# Patient Record
Sex: Female | Born: 1935 | Race: White | Hispanic: No | Smoking: Never smoker
Health system: Southern US, Community
[De-identification: ages and names within clinical notes are randomized; demographics above are authoritative.]

## PROBLEM LIST (undated history)

## (undated) DIAGNOSIS — I639 Cerebral infarction, unspecified: Secondary | ICD-10-CM

## (undated) DIAGNOSIS — K219 Gastro-esophageal reflux disease without esophagitis: Secondary | ICD-10-CM

## (undated) DIAGNOSIS — U071 COVID-19: Secondary | ICD-10-CM

## (undated) DIAGNOSIS — I10 Essential (primary) hypertension: Secondary | ICD-10-CM

## (undated) DIAGNOSIS — C801 Malignant (primary) neoplasm, unspecified: Secondary | ICD-10-CM

## (undated) DIAGNOSIS — E785 Hyperlipidemia, unspecified: Secondary | ICD-10-CM

## (undated) DIAGNOSIS — I4891 Unspecified atrial fibrillation: Secondary | ICD-10-CM

## (undated) HISTORY — DX: Malignant (primary) neoplasm, unspecified: C80.1

## (undated) HISTORY — DX: Essential (primary) hypertension: I10

## (undated) HISTORY — DX: Hyperlipidemia, unspecified: E78.5

## (undated) HISTORY — DX: Unspecified atrial fibrillation: I48.91

## (undated) HISTORY — DX: Cerebral infarction, unspecified: I63.9

## (undated) HISTORY — DX: Gastro-esophageal reflux disease without esophagitis: K21.9

---
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
## 1981-11-29 HISTORY — PX: TOTAL ABDOMINAL HYSTERECTOMY: SHX209

## 2007-11-30 HISTORY — PX: COLONOSCOPY: SHX174

## 2013-11-29 HISTORY — PX: CATARACT EXTRACTION: SUR2

## 2016-02-09 ENCOUNTER — Telehealth: Payer: Self-pay | Admitting: Cardiology

## 2016-02-09 NOTE — Telephone Encounter (Signed)
Received records from [facility] for appointment on 02/13/16 with Dr Percival Spanish.  Records given to [facility] (medical records) for Dr Hochrein's schedule on 02/13/16.

## 2016-02-12 NOTE — Progress Notes (Signed)
I have personally oh     Cardiology Office Note   Date:  02/13/2016   ID:  Sarah Armstrong, DOB 1931-05-21, MRN [id number]  PCP:  No primary care provider on file.  Cardiologist:   Minus Breeding, MD   Chief Complaint  Patient presents with  . Atrial Fibrillation      History of Present Illness: Sarah Armstrong is a 82 y.o. female who presents for evaluation of atrial fibrillation. She was found to have this in January. She had been caring for her husband who had chronic health issues and who subsequently died in 01/24/2023. At the noticing palpitations for a few weeks before that. She eventually was noted to have atrial fibrillation. I did review records from his cardiologist in [place] saw her. Echocardiography demonstrated normal ejection fraction with some mild mitral and tricuspid regurgitation. She also had some mild carotid stenosis on Doppler. Cardizem was added to her medications and she was started on Xarelto. Of note that dose has been reduced from 20-15 mg apparently in response to her creatinine although I don't have these labs.  The patient had not had prior cardiac workup or problems. She does feel the palpitations. She feels fatigued. She has more dyspnea on exertion and she was having prior to developing the fibrillation. Her heart rates in the 90s. She did bring a blood pressure diary and her blood pressures been well controlled. She denies any chest pressure, neck discomfort. She is rather sedentary and seems. She's not having any PND or orthopnea. She's had no weight gain or edema.   Past Medical History  Diagnosis Date  . Hypertension   . Hyperlipidemia   . Stroke ([place])   . GERD (gastroesophageal reflux disease)   . Cancer ([place]) skin  . Atrial fibrillation [facility])     Past Surgical History  Procedure Laterality Date  . Tonsillectomy  1953  . Total abdominal hysterectomy  1983  . Colonoscopy  2009  . Cataract extraction  2015     Current Outpatient Prescriptions    Medication Sig Dispense Refill  . acetaminophen (TYLENOL) 500 MG tablet Take 500 mg by mouth every 6 (six) hours as needed.    Marland Kitchen atenolol (TENORMIN) 100 MG tablet Take 1 tablet by mouth daily.    Marland Kitchen atorvastatin (LIPITOR) 20 MG tablet Take 1 tablet by mouth daily.    . Cholecalciferol (VITAMIN D3 SUPER STRENGTH) 2000 units TABS Take 1 tablet by mouth daily.    . diazepam (VALIUM) 5 MG tablet Take 1 tablet by mouth as needed.    . diltiazem (CARDIZEM) 120 MG tablet Take 120 mg by mouth daily.    . lansoprazole (PREVACID) 30 MG capsule Take 1 capsule by mouth 2 (two) times daily.    Marland Kitchen loratadine (CLARITIN) 10 MG tablet Take 1 tablet by mouth daily.    . Multiple Vitamins-Minerals (CENTRUM PO) Take 5 tablets by mouth once a week.    . sodium chloride (V-R NASAL SPRAY SALINE) 0.65 % nasal spray Place 1 spray into both nostrils at bedtime.    Alveda Reasons 15 MG TABS tablet Take 1 tablet by mouth daily.     No current facility-administered medications for this visit.    Allergies:   Strawberry extract; Aspirin; Codeine; Other; Penicillin g; and Shellfish-derived products    Social History:  The patient  reports that she has never smoked. She does not have any smokeless tobacco history on file.   Family History:  The patient's family history includes COPD  in her sister; Cancer in her maternal grandfather; Diabetes in her child; Heart disease in her father; Heart disease (age of onset: 54) in her brother and brother; Hypertension in her child and mother; Leukemia in her brother; Parkinson's disease in her sister; Stroke in her maternal grandmother.    ROS:  Please see the history of present illness.   Otherwise, review of systems are positive for none.   All other systems are reviewed and negative.    PHYSICAL EXAM: VS:  BP 138/64 mmHg  Pulse 91 , BMI There is no height or weight on file to calculate BMI. GENERAL:  Well appearing HEENT:  Pupils equal round and reactive, fundi not visualized,  oral mucosa unremarkable NECK:  No jugular venous distention, waveform within normal limits, carotid upstroke brisk and symmetric, no bruits, no thyromegaly LYMPHATICS:  No cervical, inguinal adenopathy LUNGS:  Clear to auscultation bilaterally BACK:  No CVA tenderness CHEST:  Unremarkable HEART:  PMI not displaced or sustained,S1 and S2 within normal limits, no S3, no clicks, no rubs, no murmurs, irregular ABD:  Flat, positive bowel sounds normal in frequency in pitch, no bruits, no rebound, no guarding, no midline pulsatile mass, no hepatomegaly, no splenomegaly EXT:  2 plus pulses throughout, no edema, no cyanosis no clubbing SKIN:  No rashes no nodules NEURO:  Cranial nerves II through XII grossly intact, motor grossly intact throughout PSYCH:  Cognitively intact, oriented to person place and time    EKG:  EKG is not ordered today. The ekg ordered 1/11/17demonstrates atrial fibrillation, rate 92, axis within normal limits, intervals within normal limits, borderline low voltage in limb leads, no acute ST-T wave changes.   Recent Labs: No results found for requested labs within last 365 days.    Lipid Panel No results found for: CHOL, TRIG, HDL, CHOLHDL, VLDL, LDLCALC, LDLDIRECT    Wt Readings from Last 3 Encounters:  No data found for Wt      Other studies Reviewed: Additional studies/ records that were reviewed today include: EKGs, echo report and outside office records. Review of the above records demonstrates:  Please see elsewhere in the note.     ASSESSMENT AND PLAN:  ATRIAL FIB:  I had a long discussion with the patient and her daughters about this dysrhythmia. I do think she symptomatic with this. The rate seems to be reasonably well controlled. I'm going to make sure that she is on the appropriate dose Xarelto. Once she has been on the appropriate dose for 3 weeks we can plan cardioversion. For now she will remain on the meds as listed. I will be checking a BNP  level, basic metabolic profile and TSH.  MR/TR:  These are mild ankle.clinically and repeat echocardiography in the future.   Current medicines are reviewed at length with the patient today.  The patient does not have concerns regarding medicines.  The following changes have been made:  no change  Labs/ tests ordered today include:   Orders Placed This Encounter  Procedures  . Basic Metabolic Panel (BMET)  . B Nat Peptide  . TSH     Disposition:   FU with me at the time of DCCV.     Signed, Minus Breeding, MD  02/13/2016 5:44 PM    [facility]

## 2016-02-13 ENCOUNTER — Ambulatory Visit (INDEPENDENT_AMBULATORY_CARE_PROVIDER_SITE_OTHER): Payer: Medicare Other | Admitting: Cardiology

## 2016-02-13 ENCOUNTER — Encounter: Payer: Self-pay | Admitting: Cardiology

## 2016-02-13 VITALS — BP 138/64 | HR 91 | Ht 67.0 in | Wt 181.0 lb

## 2016-02-13 DIAGNOSIS — I4819 Other persistent atrial fibrillation: Secondary | ICD-10-CM

## 2016-02-13 DIAGNOSIS — I4891 Unspecified atrial fibrillation: Secondary | ICD-10-CM | POA: Insufficient documentation

## 2016-02-13 DIAGNOSIS — Z79899 Other long term (current) drug therapy: Secondary | ICD-10-CM

## 2016-02-13 DIAGNOSIS — I481 Persistent atrial fibrillation: Secondary | ICD-10-CM | POA: Diagnosis not present

## 2016-02-13 DIAGNOSIS — R0602 Shortness of breath: Secondary | ICD-10-CM | POA: Diagnosis not present

## 2016-02-13 NOTE — Patient Instructions (Signed)
Your physician recommends that you return for lab work in: BNP, TSH, BMP

## 2016-02-14 LAB — BASIC METABOLIC PANEL
BUN: 15 mg/dL (ref 7–25)
CALCIUM: 9.5 mg/dL (ref 8.6–10.4)
CHLORIDE: 102 mmol/L (ref 98–110)
CO2: 27 mmol/L (ref 20–31)
CREATININE: 0.96 mg/dL — AB (ref 0.60–0.88)
GLUCOSE: 141 mg/dL — AB (ref 65–99)
Potassium: 4.4 mmol/L (ref 3.5–5.3)
Sodium: 141 mmol/L (ref 135–146)

## 2016-02-14 LAB — BRAIN NATRIURETIC PEPTIDE: Brain Natriuretic Peptide: 120.5 pg/mL — ABNORMAL HIGH (ref <100)

## 2016-02-14 LAB — TSH: TSH: 1.66 mIU/L

## 2016-02-16 ENCOUNTER — Telehealth: Payer: Self-pay | Admitting: *Deleted

## 2016-02-16 MED ORDER — RIVAROXABAN 20 MG PO TABS: 20.0000 mg | ORAL_TABLET | Freq: Every day | ORAL | Status: DC

## 2016-02-16 NOTE — Telephone Encounter (Signed)
Spoke with pt Daughter told her her mom will need to increase Xarelto for Cardioversion in 3 weeks, pt stated her mom was in the Emergency Room at [facility] on Sunday for nose bleed second nose bleed since being on Xarelto, pt will be going to ENT on Wednesday to see Dr Melida Quitter, pt think her mom might be skeptical about increasing Xarelto but will give it to her because you told her to.  Xarelto 20mg  # 30 11 refills was send to [facility] on [address].

## 2016-02-16 NOTE — Telephone Encounter (Signed)
-----   Message from Minus Breeding, MD sent at 02/16/2016  9:43 AM EDT ----- Her creat clearance calculates to be 56.  She should be on Xarelto 20 mg prior to cardioversion.  She will need to be on this for 3 weeks before I can schedule cardioversion.  Call her and call in Xarelto 20 mg one po daily disp number 30 with 11 refills.  Schedule a DCCV in 3 weeks by me.

## 2016-02-17 ENCOUNTER — Telehealth: Payer: Self-pay | Admitting: Cardiology

## 2016-02-17 NOTE — Telephone Encounter (Signed)
Returned call. Pt has PCP in [place], but doesn't have a lot of confidence in the provider. When pt's daughter had called, was communicated that she would address the patient's concerns in May at next sched f/u.  Epistaxis addressed at last visit. Pt to see ENT tomorrow.  Daughter sees provider at [facility]. She can take the patient there for change in PCP. She wanted to verify Dr. Percival Spanish agreed w/ my recommendation to defer to PCP for antidepressant. Will route.

## 2016-02-17 NOTE — Telephone Encounter (Signed)
Pt daughter would like to know if Dr Warren Lacy can precribe an anti depression medicine for her mother.. The pt is going through a lot,her husband passed away about a month ago,having nose bleeds and some confusion.

## 2016-02-18 NOTE — Telephone Encounter (Signed)
Left msg w/ advice & recommendation to call if questions.

## 2016-02-18 NOTE — Telephone Encounter (Signed)
She needs to get the nose bleed situation sorted out before we can plan cardioversion.  If they tell her that she needs to hold the Xarelto we will have to defer the cardioversion.  If she has ongoing bleeding then she will need to hold Xarelto.  If the bleeding is controlled and not ongoing and ENT says it is OK her therapeutic dose is not 15 mg it is 20.  She would be on 3 weeks before elective cardioversion.   We need to understand when she is seeing ENT and what they recommend.

## 2016-02-18 NOTE — Telephone Encounter (Signed)
Yes.  I don't feel confident prescribing the antidepressants.

## 2016-02-24 NOTE — Telephone Encounter (Signed)
Spoke with pt daughter, she have not been off of Xarelto, Cardioversion will be order for the second week in april

## 2016-02-25 ENCOUNTER — Telehealth: Payer: Self-pay | Admitting: Cardiology

## 2016-02-25 ENCOUNTER — Emergency Department (HOSPITAL_BASED_OUTPATIENT_CLINIC_OR_DEPARTMENT_OTHER)
Admission: EM | Admit: 2016-02-25 | Discharge: 2016-02-25 | Disposition: A | Discharge status: Home / Self Care | Payer: Medicare Other | Attending: Emergency Medicine | Admitting: Emergency Medicine

## 2016-02-25 ENCOUNTER — Emergency Department (HOSPITAL_BASED_OUTPATIENT_CLINIC_OR_DEPARTMENT_OTHER): Payer: Medicare Other

## 2016-02-25 ENCOUNTER — Encounter (HOSPITAL_BASED_OUTPATIENT_CLINIC_OR_DEPARTMENT_OTHER): Payer: Self-pay

## 2016-02-25 DIAGNOSIS — I4891 Unspecified atrial fibrillation: Secondary | ICD-10-CM | POA: Diagnosis present

## 2016-02-25 DIAGNOSIS — Z79899 Other long term (current) drug therapy: Secondary | ICD-10-CM | POA: Diagnosis not present

## 2016-02-25 DIAGNOSIS — I482 Chronic atrial fibrillation, unspecified: Secondary | ICD-10-CM

## 2016-02-25 DIAGNOSIS — Z7901 Long term (current) use of anticoagulants: Secondary | ICD-10-CM | POA: Insufficient documentation

## 2016-02-25 DIAGNOSIS — F419 Anxiety disorder, unspecified: Secondary | ICD-10-CM | POA: Insufficient documentation

## 2016-02-25 DIAGNOSIS — R05 Cough: Secondary | ICD-10-CM | POA: Insufficient documentation

## 2016-02-25 DIAGNOSIS — K219 Gastro-esophageal reflux disease without esophagitis: Secondary | ICD-10-CM | POA: Diagnosis not present

## 2016-02-25 DIAGNOSIS — I10 Essential (primary) hypertension: Secondary | ICD-10-CM | POA: Insufficient documentation

## 2016-02-25 DIAGNOSIS — Z880 Allergy status to penicillin: Secondary | ICD-10-CM | POA: Diagnosis not present

## 2016-02-25 DIAGNOSIS — E785 Hyperlipidemia, unspecified: Secondary | ICD-10-CM | POA: Diagnosis not present

## 2016-02-25 DIAGNOSIS — Z8673 Personal history of transient ischemic attack (TIA), and cerebral infarction without residual deficits: Secondary | ICD-10-CM | POA: Diagnosis not present

## 2016-02-25 DIAGNOSIS — Z859 Personal history of malignant neoplasm, unspecified: Secondary | ICD-10-CM | POA: Insufficient documentation

## 2016-02-25 DIAGNOSIS — R197 Diarrhea, unspecified: Secondary | ICD-10-CM | POA: Diagnosis not present

## 2016-02-25 DIAGNOSIS — R002 Palpitations: Secondary | ICD-10-CM

## 2016-02-25 LAB — CBC WITH DIFFERENTIAL/PLATELET
BASOS ABS: 0 10*3/uL (ref 0.0–0.1)
BASOS PCT: 0 %
EOS ABS: 0.2 10*3/uL (ref 0.0–0.7)
Eosinophils Relative: 2 %
HCT: 41.5 % (ref 36.0–46.0)
HEMOGLOBIN: 13.4 g/dL (ref 12.0–15.0)
Lymphocytes Relative: 27 %
Lymphs Abs: 2.3 10*3/uL (ref 0.7–4.0)
MCH: 32.5 pg (ref 26.0–34.0)
MCHC: 32.3 g/dL (ref 30.0–36.0)
MCV: 100.7 fL — ABNORMAL HIGH (ref 78.0–100.0)
MONOS PCT: 15 %
Monocytes Absolute: 1.3 10*3/uL — ABNORMAL HIGH (ref 0.1–1.0)
NEUTROS PCT: 56 %
Neutro Abs: 4.8 10*3/uL (ref 1.7–7.7)
Platelets: 190 10*3/uL (ref 150–400)
RBC: 4.12 MIL/uL (ref 3.87–5.11)
RDW: 13 % (ref 11.5–15.5)
WBC: 8.5 10*3/uL (ref 4.0–10.5)

## 2016-02-25 LAB — BASIC METABOLIC PANEL
Anion gap: 10 (ref 5–15)
BUN: 9 mg/dL (ref 6–20)
CALCIUM: 9.2 mg/dL (ref 8.9–10.3)
CO2: 27 mmol/L (ref 22–32)
CREATININE: 0.86 mg/dL (ref 0.44–1.00)
Chloride: 102 mmol/L (ref 101–111)
Glucose, Bld: 110 mg/dL — ABNORMAL HIGH (ref 65–99)
Potassium: 3.6 mmol/L (ref 3.5–5.1)
SODIUM: 139 mmol/L (ref 135–145)

## 2016-02-25 LAB — PROTIME-INR
INR: 1.66 — ABNORMAL HIGH (ref 0.00–1.49)
PROTHROMBIN TIME: 19.6 s — AB (ref 11.6–15.2)

## 2016-02-25 LAB — TROPONIN I

## 2016-02-25 MED ORDER — DILTIAZEM HCL 60 MG PO TABS: 60.0000 mg | ORAL_TABLET | Freq: Every day | ORAL | Status: DC

## 2016-02-25 NOTE — ED Notes (Signed)
Patient transported to X-ray 

## 2016-02-25 NOTE — ED Notes (Signed)
MD at bedside. 

## 2016-02-25 NOTE — Telephone Encounter (Signed)
[name] is calling because Sarah Armstrong is schedule for a cardioversion  next week . Her heart is very iratic and causing other problems , she is having upset stomach , pain in her shoulder , constant heartburn . Please call   Thanks

## 2016-02-25 NOTE — Discharge Instructions (Signed)
AS WE DISCUSSED - YOU CAN TAKE 180MG  OF CARDIZEM DAILY (IN THE MORNING) YOU CAN TAKE ATENOLOL 50MG  IN THE MORNING AND 50MG  AT NIGHT TAKE CARE  Atrial Fibrillation Atrial fibrillation is a type of heartbeat that is irregular or fast (rapid). If you have this condition, your heart keeps quivering in a weird (chaotic) way. This condition can make it so your heart cannot pump blood normally. Having this condition gives a person more risk for stroke, heart failure, and other heart problems. There are different types of atrial fibrillation. Talk with your doctor to learn about the type that you have. HOME CARE  Take over-the-counter and prescription medicines only as told by your doctor.  If your doctor prescribed a blood-thinning medicine, take it exactly as told. Taking too much of it can cause bleeding. If you do not take enough of it, you will not have the protection that you need against stroke and other problems.  Do not use any tobacco products. These include cigarettes, chewing tobacco, and e-cigarettes. If you need help quitting, ask your doctor.  If you have apnea (obstructive sleep apnea), manage it as told by your doctor.  Do not drink alcohol.  Do not drink beverages that have caffeine. These include coffee, soda, and tea.  Maintain a healthy weight. Do not use diet pills unless your doctor says they are safe for you. Diet pills may make heart problems worse.  Follow diet instructions as told by your doctor.  Exercise regularly as told by your doctor.  Keep all follow-up visits as told by your doctor. This is important. GET HELP IF:  You notice a change in the speed, rhythm, or strength of your heartbeat.  You are taking a blood-thinning medicine and you notice more bruising.  You get tired more easily when you move or exercise. GET HELP RIGHT AWAY IF:  You have pain in your chest or your belly (abdomen).  You have sweating or weakness.  You feel sick to your stomach  (nauseous).  You notice blood in your throw up (vomit), poop (stool), or pee (urine).  You are short of breath.  You suddenly have swollen feet and ankles.  You feel dizzy.  Your suddenly get weak or numb in your face, arms, or legs, especially if it happens on one side of your body.  You have trouble talking, trouble understanding, or both.  Your face or your eyelid droops on one side. These symptoms may be an emergency. Do not wait to see if the symptoms will go away. Get medical help right away. Call your local emergency services (911 in the [place] Do not drive yourself to the hospital.   This information is not intended to replace advice given to you by your health care provider. Make sure you discuss any questions you have with your health care provider.   Document Released: 08/24/2008 Document Revised: 08/06/2015 Document Reviewed: 03/12/2015 Elsevier Interactive Patient Education [employer].

## 2016-02-25 NOTE — ED Notes (Signed)
Pt to exam room 4, placed on cont cardiac monitor with freq NBP and cont POX, ECG being done

## 2016-02-25 NOTE — ED Notes (Addendum)
Hx of Afib-dx in Jan 2017-started on meds-to be scheduled for cardioversion next week-pt c/o increase in CP, SOB-brought to triage by daughters x 2 in w/c-denies pain at present-NAD

## 2016-02-25 NOTE — ED Provider Notes (Signed)
CSN: [id number]     Arrival date & time 02/25/16  1233 History   First MD Initiated Contact with Patient 02/25/16 1250     Chief Complaint  Patient presents with  . Atrial Fibrillation    Patient is a 82 y.o. female presenting with atrial fibrillation. The history is provided by the patient.  Atrial Fibrillation This is a chronic problem. The current episode started more than 1 week ago. The problem occurs daily. The problem has been gradually worsening. Associated symptoms include shortness of breath. Pertinent negatives include no abdominal pain. Nothing aggravates the symptoms.  Patient with h/o HTN, hyperlipidemia and chronic a-fib presents with increased palpitations and shortness of breath.  She reports she takes meds everyday for afib, but only in the morning with breakfast, and usually by middle of the night around 3am she will have episodes of heart racing/palpitations.  She will also have mild CP at that time She is now feeling improved No CP at this time No syncope No fever/vomiting She reports fatigue for "Awhile"     Past Medical History  Diagnosis Date  . Hypertension   . Hyperlipidemia   . Stroke ([place])   . GERD (gastroesophageal reflux disease)   . Cancer ([place]) skin  . Atrial fibrillation [facility])    Past Surgical History  Procedure Laterality Date  . Tonsillectomy  1953  . Total abdominal hysterectomy  1983  . Colonoscopy  2009  . Cataract extraction  2015   Family History  Problem Relation Age of Onset  . Hypertension Mother   . Heart disease Father     CHF  . Heart disease Brother 67  . Stroke Maternal Grandmother   . Cancer Maternal Grandfather   . Leukemia Brother   . Heart disease Brother 71  . COPD Sister   . Parkinson's disease Sister   . Hypertension Child   . Diabetes Child    Social History  Substance Use Topics  . Smoking status: Never Smoker   . Smokeless tobacco: None  . Alcohol Use: None   OB History    No data available      Review of Systems  Constitutional: Positive for fatigue. Negative for fever.  Respiratory: Positive for cough and shortness of breath.   Cardiovascular: Positive for palpitations.  Gastrointestinal: Positive for diarrhea. Negative for vomiting, abdominal pain and blood in stool.  Neurological: Negative for syncope.  Psychiatric/Behavioral: The patient is nervous/anxious.   All other systems reviewed and are negative.     Allergies  Strawberry extract; Aspirin; Codeine; Other; Penicillin g; and Shellfish-derived products  Home Medications   Prior to Admission medications   Medication Sig Start Date End Date Taking? Authorizing Provider  PARoxetine (PAXIL) 10 MG tablet Take 10 mg by mouth daily.   Yes Historical Provider, MD  acetaminophen (TYLENOL) 500 MG tablet Take 500 mg by mouth every 6 (six) hours as needed.    Historical Provider, MD  atenolol (TENORMIN) 100 MG tablet Take 1 tablet by mouth daily. 01/19/16   Historical Provider, MD  atorvastatin (LIPITOR) 20 MG tablet Take 1 tablet by mouth daily. 06/26/15   Historical Provider, MD  Cholecalciferol (VITAMIN D3 SUPER STRENGTH) 2000 units TABS Take 1 tablet by mouth daily.    Historical Provider, MD  diazepam (VALIUM) 5 MG tablet Take 1 tablet by mouth as needed. 12/03/15   Historical Provider, MD  diltiazem (CARDIZEM) 120 MG tablet Take 120 mg by mouth daily.    Historical Provider, MD  diltiazem (  CARDIZEM) 60 MG tablet Take 1 tablet (60 mg total) by mouth daily. 02/25/16   Ripley Fraise, MD  lansoprazole (PREVACID) 30 MG capsule Take 1 capsule by mouth 2 (two) times daily. 02/09/16   Historical Provider, MD  loratadine (CLARITIN) 10 MG tablet Take 1 tablet by mouth daily.    Historical Provider, MD  Multiple Vitamins-Minerals (CENTRUM PO) Take 5 tablets by mouth once a week.    Historical Provider, MD  rivaroxaban (XARELTO) 20 MG TABS tablet Take 1 tablet (20 mg total) by mouth daily with supper. 02/16/16   Minus Breeding, MD   sodium chloride (V-R NASAL SPRAY SALINE) 0.65 % nasal spray Place 1 spray into both nostrils at bedtime.    Historical Provider, MD   BP 135/74 mmHg  Pulse 94  Temp(Src) 98.7 F (37.1 C) (Oral)  Resp 17  Ht 5\' 7"  (1.702 m)  Wt 82.101 kg  BMI 28.34 kg/m2  SpO2 96% Physical Exam CONSTITUTIONAL:Elderly, no distress HEAD: Normocephalic/atraumatic EYES: EOMI/PERRL ENMT: Mucous membranes moist, uvula midline NECK: supple no meningeal signs SPINE/BACK:entire spine nontender CV: S1/S2 noted, irregular LUNGS: brief crackles in left base, no distress noted ABDOMEN: soft, nontender, no rebound or guarding, bowel sounds noted throughout abdomen GU:no cva tenderness NEURO: Pt is awake/alert/appropriate, moves all extremitiesx4.   EXTREMITIES: pulses normal/equal, full ROM SKIN: warm, color normal PSYCH: mildly anxious  ED Course  Procedures  Labs Review Labs Reviewed  BASIC METABOLIC PANEL - Abnormal; Notable for the following:    Glucose, Bld 110 (*)    All other components within normal limits  CBC WITH DIFFERENTIAL/PLATELET - Abnormal; Notable for the following:    MCV 100.7 (*)    Monocytes Absolute 1.3 (*)    All other components within normal limits  PROTIME-INR - Abnormal; Notable for the following:    Prothrombin Time 19.6 (*)    INR 1.66 (*)    All other components within normal limits  TROPONIN I    Imaging Review Dg Chest 2 View  02/25/2016  CLINICAL DATA:  Weakness, fatigue, shortness of breath, cough, chest pain, palpitations EXAM: CHEST  2 VIEW COMPARISON:  None. FINDINGS: Cardiomegaly. There is hyperinflation of the lungs compatible with COPD. No confluent airspace opacities or effusions. Mild vascular congestion. No acute bony abnormality. IMPRESSION: COPD.  Cardiomegaly with vascular congestion. Electronically Signed   By: Rolm Baptise M.D.   On: 02/25/2016 13:50   I have personally reviewed and evaluated these images and lab results as part of my medical  decision-making.   EKG Interpretation   Date/Time:  Wednesday February 25 2016 12:51:48 EDT Ventricular Rate:  83 PR Interval:    QRS Duration: 85 QT Interval:  420 QTC Calculation: 493 R Axis:   43 Text Interpretation:  Atrial fibrillation Borderline T abnormalities,  anterior leads Borderline prolonged QT interval Baseline wander in lead(s)  V2 No previous ECGs available Abnormal ekg Confirmed by Christy Gentles  MD,  Elenore Rota ([id number]) on 02/25/2016 12:59:57 PM     Pt presents with worsening signs of her chronic afib Pt stable in the ED Per cardiology notes, she was to be on xarelto 20mg  daily for 3 weeks prior to cardioversion It has not been 3 weeks as of yet I spoke to dr hochrein, he recommends to help prevent tachycardia, increase her cardizem to 180mg  daily, and take atenolol 50mg  BID.   The only other option is TEE-cardioversion, which the patient is not requesting at this time She is otherwise well appearing/stable Patient/family agreeable with  plan   MDM   Final diagnoses:  Chronic atrial fibrillation ([place])  Palpitations    Nursing notes including past medical history and social history reviewed and considered in documentation xrays/imaging reviewed by myself and considered during evaluation Labs/vital reviewed myself and considered during evaluation Previous records reviewed and considered - recent office visits reviewed from cardiology     Ripley Fraise, MD 02/25/16 1610

## 2016-02-25 NOTE — ED Notes (Signed)
Pt presents with irregular HR, states she has been dx with A-fib in the past month and started on cardizem per Dr. Percival Spanish, MD/ Cardiologist. States would have some left ant chest discomfort, but main complaint is weakness and fatigue.

## 2016-02-25 NOTE — Telephone Encounter (Signed)
Pt daughter called in stating pt is having continuous palpitations,SOB, and weakness. Pt has stopped taking her Prevacid as it was causing GI upset. Pt was started on Nexium but has not started taking it yet. Pt has also had diarrhea for the past few days.  Last night during sleeping pt was "quivering and SOB" according to daughter who was in room with pt. Pt never stopped her Xarelto during her nose bleeds last week, ENT was able to cauterize bleeds and gave her Bactroban.  Pt GI upset has gotten worse and she is also having shoulder pain.  Pt daughter said they were in the car headed to [place]. Told I would make Dr. Percival Spanish aware, verbalized understanding, no additional questions at this time.

## 2016-02-25 NOTE — ED Notes (Addendum)
States has some SOB when up walking around and also has palpations

## 2016-02-26 NOTE — Telephone Encounter (Signed)
I talked with the ED MD about this patient and we have a plan.  Please see ED notes.

## 2016-02-27 ENCOUNTER — Telehealth: Payer: Self-pay | Admitting: Cardiology

## 2016-02-27 NOTE — Telephone Encounter (Signed)
New message     Pt c/o BP issue: STAT if pt c/o blurred vision, one-sided weakness or slurred speech  1. What are your last 5 BP readings? 160/96 and HR 100---high; 111/54 and HR 56 low 2. Are you having any other symptoms (ex. Dizziness, headache, blurred vision, passed out)? Some sob 3. What is your BP issue?  In the mornings, pt bp and pulse is high.  She is still having palpitations in am.  By lunch time, her bp and HR has dropped low.  Should daughter change the extra 60mg  of cardizem to the night time?

## 2016-02-27 NOTE — Telephone Encounter (Signed)
The Cardizem is once daily.  Her heart rate is OK for now.  The cardioversion cannot be done until she has been on the 20 mg dose for 3 weeks.  That dose was prescribed when I saw her 2 weeks ago.  She could have the DCCV scheduled electively next Friday.

## 2016-02-27 NOTE — Telephone Encounter (Signed)
Daughter calling back. Notes recent ED visit for comlants. Meds were changed. Pt's cardizem was increased. Taking 50mg  atenolol BID.  Forcing fluids, but pt has upset stomach. Notes BP high in AM 165/90 HR 89-102  Gave meds Repeat a few mins ago 111/54 HR 56   Daughter has 2 concerns: should the cardizem be taken only once a day? (I'm not sure if this is a long acting form) Also, pt has been on xarelto for 3 weeks- is she suitable for cardioversion, and if so, how soon would that be scheduled?  Daughter is trying to figure out whether to take patient back up to [place] - she will keep her in [place] if only going to be a few days.

## 2016-02-27 NOTE — Telephone Encounter (Signed)
Recommendations given to caller who voiced understanding, message sent to scheduling.

## 2016-03-02 ENCOUNTER — Telehealth: Payer: Self-pay | Admitting: *Deleted

## 2016-03-02 DIAGNOSIS — I4819 Other persistent atrial fibrillation: Secondary | ICD-10-CM

## 2016-03-02 NOTE — Telephone Encounter (Signed)
-----   Message from Minus Breeding, MD sent at 02/16/2016  9:43 AM EDT ----- Her creat clearance calculates to be 56.  She should be on Xarelto 20 mg prior to cardioversion.  She will need to be on this for 3 weeks before I can schedule cardioversion.  Call her and call in Xarelto 20 mg one po daily disp number 30 with 11 refills.  Schedule a DCCV in 3 weeks by me.

## 2016-03-02 NOTE — Telephone Encounter (Signed)
Cardioversion ordered and send to scheduler

## 2016-03-03 ENCOUNTER — Telehealth: Payer: Self-pay | Admitting: Cardiology

## 2016-03-03 NOTE — Telephone Encounter (Signed)
Returned call to patient no answer.LMTC. 

## 2016-03-03 NOTE — Telephone Encounter (Signed)
New message   Pt daughter is calling to speak to rn

## 2016-03-03 NOTE — Telephone Encounter (Signed)
New message     Patient calling back to speak with nurse - verbalized wants a call back today

## 2016-03-03 NOTE — Telephone Encounter (Signed)
Received call from daughter calling for cardioversion instructions.Cardioversion scheduled 03/04/16 at 10:00 am.Advised to arrive at 8:00 am at [facility] ([place]).Cardioversion instructions reviewed with daughter she voiced understanding.Stated she will call back to schedule follow up visit,she has to check with her sister.

## 2016-03-04 ENCOUNTER — Encounter (HOSPITAL_COMMUNITY): Payer: Self-pay | Admitting: *Deleted

## 2016-03-04 ENCOUNTER — Telehealth: Payer: Self-pay | Admitting: Cardiology

## 2016-03-04 ENCOUNTER — Other Ambulatory Visit: Payer: Self-pay | Admitting: Cardiology

## 2016-03-04 ENCOUNTER — Encounter (HOSPITAL_COMMUNITY)
Admission: RE | Disposition: A | Discharge status: Home / Self Care | Payer: Self-pay | Source: Ambulatory Visit | Attending: Cardiology

## 2016-03-04 ENCOUNTER — Ambulatory Visit (HOSPITAL_COMMUNITY): Payer: Medicare Other | Admitting: Certified Registered Nurse Anesthetist

## 2016-03-04 ENCOUNTER — Ambulatory Visit (HOSPITAL_COMMUNITY)
Admission: RE | Admit: 2016-03-04 | Discharge: 2016-03-04 | Disposition: A | Discharge status: Home / Self Care | Payer: Medicare Other | Source: Ambulatory Visit | Attending: Cardiology | Admitting: Cardiology

## 2016-03-04 DIAGNOSIS — I4819 Other persistent atrial fibrillation: Secondary | ICD-10-CM

## 2016-03-04 DIAGNOSIS — I481 Persistent atrial fibrillation: Secondary | ICD-10-CM

## 2016-03-04 DIAGNOSIS — K219 Gastro-esophageal reflux disease without esophagitis: Secondary | ICD-10-CM | POA: Insufficient documentation

## 2016-03-04 DIAGNOSIS — I10 Essential (primary) hypertension: Secondary | ICD-10-CM | POA: Diagnosis not present

## 2016-03-04 DIAGNOSIS — I4891 Unspecified atrial fibrillation: Secondary | ICD-10-CM | POA: Diagnosis present

## 2016-03-04 DIAGNOSIS — Z7901 Long term (current) use of anticoagulants: Secondary | ICD-10-CM | POA: Insufficient documentation

## 2016-03-04 DIAGNOSIS — E785 Hyperlipidemia, unspecified: Secondary | ICD-10-CM | POA: Diagnosis not present

## 2016-03-04 DIAGNOSIS — Z8673 Personal history of transient ischemic attack (TIA), and cerebral infarction without residual deficits: Secondary | ICD-10-CM | POA: Diagnosis not present

## 2016-03-04 HISTORY — PX: CARDIOVERSION: SHX1299

## 2016-03-04 SURGERY — CARDIOVERSION
Anesthesia: Monitor Anesthesia Care

## 2016-03-04 MED ORDER — SODIUM CHLORIDE 0.9% FLUSH: 3.0000 mL | Freq: Two times a day (BID) | INTRAVENOUS | Status: DC

## 2016-03-04 MED ORDER — PROPOFOL 10 MG/ML IV BOLUS
INTRAVENOUS | Status: DC | PRN
  Administered 2016-03-04 (×2): 30 mg via INTRAVENOUS
  Administered 2016-03-04: 20 mg via INTRAVENOUS

## 2016-03-04 MED ORDER — SODIUM CHLORIDE 0.9% FLUSH: 3.0000 mL | INTRAVENOUS | Status: DC | PRN

## 2016-03-04 MED ORDER — SODIUM CHLORIDE 0.9 % IV SOLN
250.0000 mL | INTRAVENOUS | Status: DC
  Administered 2016-03-04: 500 mL via INTRAVENOUS

## 2016-03-04 MED ORDER — HYDROCORTISONE 1 % EX CREA: 1.0000 "application " | TOPICAL_CREAM | Freq: Three times a day (TID) | CUTANEOUS | Status: DC | PRN

## 2016-03-04 NOTE — Telephone Encounter (Signed)
Returned call to patient's daughter [name].She stated she wanted to ask Dr.Hochrein if mother needed to continue cardizem 60 mg tablet .Stated she is taking cardizem 120 mg and 60 mg daily and she is out of 60 mg.Spoke to Dr.Hochrein he advised ok to stop cardizem 60 mg.Continue cardizem 120 mg daily.

## 2016-03-04 NOTE — Interval H&P Note (Signed)
History and Physical Interval Note:  03/04/2016 9:57 AM  [name]  has presented today for surgery, with the diagnosis of AFIB  The various methods of treatment have been discussed with the patient and family. After consideration of risks, benefits and other options for treatment, the patient has consented to  Procedure(s): CARDIOVERSION (N/A) as a surgical intervention .  The patient's history has been reviewed, patient examined, no change in status, stable for surgery.  I have reviewed the patient's chart and labs.  Questions were answered to the patient's satisfaction.     [name],[name]

## 2016-03-04 NOTE — Transfer of Care (Signed)
Immediate Anesthesia Transfer of Care Note  Patient: Sarah Armstrong  Procedure(s) Performed: Procedure(s): CARDIOVERSION (N/A)  Patient Location: Endoscopy Unit  Anesthesia Type:MAC  Level of Consciousness: awake, alert  and oriented  Airway & Oxygen Therapy: Patient Spontanous Breathing and Patient connected to nasal cannula oxygen  Post-op Assessment: Report given to RN and Post -op Vital signs reviewed and stable  Post vital signs: Reviewed and stable  Last Vitals:  Filed Vitals:   03/04/16 0901  BP: 169/85  Temp: 37.1 C  Resp: 21    Complications: No apparent anesthesia complications

## 2016-03-04 NOTE — Anesthesia Preprocedure Evaluation (Signed)
Anesthesia Evaluation  Patient identified by MRN, date of birth, ID band Patient awake    Reviewed: Allergy & Precautions, NPO status , Patient's Chart, lab work & pertinent test results  History of Anesthesia Complications Negative for: history of anesthetic complications  Airway Mallampati: II  TM Distance: >3 FB Neck ROM: Full    Dental no notable dental hx.    Pulmonary    breath sounds clear to auscultation       Cardiovascular hypertension, Pt. on medications + dysrhythmias Atrial Fibrillation  Rhythm:Irregular     Neuro/Psych CVA    GI/Hepatic Neg liver ROS, GERD  Medicated and Controlled,  Endo/Other  negative endocrine ROS  Renal/GU negative Renal ROS     Musculoskeletal   Abdominal   Peds  Hematology negative hematology ROS (+)   Anesthesia Other Findings   Reproductive/Obstetrics                             Anesthesia Physical Anesthesia Plan  ASA: III  Anesthesia Plan: MAC   Post-op Pain Management:    Induction: Intravenous  Airway Management Planned: Natural Airway, Nasal Cannula and Simple Face Mask  Additional Equipment: None  Intra-op Plan:   Post-operative Plan:   Informed Consent: I have reviewed the patients History and Physical, chart, labs and discussed the procedure including the risks, benefits and alternatives for the proposed anesthesia with the patient or authorized representative who has indicated his/her understanding and acceptance.   Dental advisory given  Plan Discussed with: CRNA and Surgeon  Anesthesia Plan Comments:         Anesthesia Quick Evaluation

## 2016-03-04 NOTE — Telephone Encounter (Signed)
New message       *STAT* If patient is at the pharmacy, call can be transferred to refill team.   1. Which medications need to be refilled? (please list name of each medication and dose if known) cardizem 60mg  2. Which pharmacy/location (including street and city if local pharmacy) is medication to be sent to? [facility] [address] [address]. Do they need a 30 day or 90 day supply?  Need only 2 1/2 weeks of medications Pt had a cardioversion this am.  She is taking 180mg  of cardizem.  She has enough 120mg  but need a little more of the 60mg  to last until her 03-26-16 ov.

## 2016-03-04 NOTE — Discharge Instructions (Signed)

## 2016-03-04 NOTE — CV Procedure (Signed)
   Electrical Cardioversion Procedure Note Sarah Armstrong [id number] 06-08-1931  Procedure: Electrical Cardioversion Indications:  Atrial Fibrillation  Time Out: Verified patient identification, verified procedure,medications/allergies/relevent history reviewed, required imaging and test results available.  Performed  Procedure Details  The patient was NPO after midnight. Anesthesia was administered at the beside  by Dr.Moser with 80mg  of propofol.  Cardioversion was done with synchronized biphasic defibrillation with AP pads with 150watts.  The patient converted to normal sinus rhythm. The patient tolerated the procedure well   IMPRESSION:  Successful cardioversion of atrial fibrillation    Sarah Armstrong 03/04/2016, 9:57 AM

## 2016-03-04 NOTE — Telephone Encounter (Signed)
Spoke to patient's daughter [name].Dr.Hochrein wanting to make sure patient has been on Xarelto 20 mg daily for 3 weeks.Daughter stated mother increased Xarelto to 20 mg daily on 02/13/16.Dr.Hochrein was told.Post hospital follow up scheduled 03/26/16 at 9:30 am with Rosaria Ferries PA at [facility] office.

## 2016-03-04 NOTE — Anesthesia Postprocedure Evaluation (Signed)
Anesthesia Post Note  Patient: Sarah Armstrong  Procedure(s) Performed: Procedure(s) (LRB): CARDIOVERSION (N/A)  Patient location during evaluation: Endoscopy Anesthesia Type: MAC Level of consciousness: awake Pain management: pain level controlled Vital Signs Assessment: post-procedure vital signs reviewed and stable Respiratory status: spontaneous breathing Cardiovascular status: stable Postop Assessment: no signs of nausea or vomiting Anesthetic complications: no    Last Vitals:  Filed Vitals:   03/04/16 1050 03/04/16 1100  BP: 136/46   Pulse: 50 57  Temp:    Resp: 17 14    Last Pain: There were no vitals filed for this visit.               Monic Engelmann

## 2016-03-04 NOTE — H&P (View-Only) (Signed)
I have personally oh     Cardiology Office Note   Date:  02/13/2016   ID:  [name], DOB August 07, 1931, MRN [id number]  PCP:  No primary care provider on file.  Cardiologist:   Minus Breeding, MD   Chief Complaint  Patient presents with  . Atrial Fibrillation      History of Present Illness: Sarah Armstrong is a 82 y.o. female who presents for evaluation of atrial fibrillation. She was found to have this in January. She had been caring for her husband who had chronic health issues and who subsequently died in January 07, 2023. At the noticing palpitations for a few weeks before that. She eventually was noted to have atrial fibrillation. I did review records from his cardiologist in [place] saw her. Echocardiography demonstrated normal ejection fraction with some mild mitral and tricuspid regurgitation. She also had some mild carotid stenosis on Doppler. Cardizem was added to her medications and she was started on Xarelto. Of note that dose has been reduced from 20-15 mg apparently in response to her creatinine although I don't have these labs.  The patient had not had prior cardiac workup or problems. She does feel the palpitations. She feels fatigued. She has more dyspnea on exertion and she was having prior to developing the fibrillation. Her heart rates in the 90s. She did bring a blood pressure diary and her blood pressures been well controlled. She denies any chest pressure, neck discomfort. She is rather sedentary and seems. She's not having any PND or orthopnea. She's had no weight gain or edema.   Past Medical History  Diagnosis Date  . Hypertension   . Hyperlipidemia   . Stroke ([facility])   . GERD (gastroesophageal reflux disease)   . Cancer ([place]) skin  . Atrial fibrillation [facility])     Past Surgical History  Procedure Laterality Date  . Tonsillectomy  1953  . Total abdominal hysterectomy  1983  . Colonoscopy  2009  . Cataract extraction  2015     Current Outpatient Prescriptions    Medication Sig Dispense Refill  . acetaminophen (TYLENOL) 500 MG tablet Take 500 mg by mouth every 6 (six) hours as needed.    Marland Kitchen atenolol (TENORMIN) 100 MG tablet Take 1 tablet by mouth daily.    Marland Kitchen atorvastatin (LIPITOR) 20 MG tablet Take 1 tablet by mouth daily.    . Cholecalciferol (VITAMIN D3 SUPER STRENGTH) 2000 units TABS Take 1 tablet by mouth daily.    . diazepam (VALIUM) 5 MG tablet Take 1 tablet by mouth as needed.    . diltiazem (CARDIZEM) 120 MG tablet Take 120 mg by mouth daily.    . lansoprazole (PREVACID) 30 MG capsule Take 1 capsule by mouth 2 (two) times daily.    Marland Kitchen loratadine (CLARITIN) 10 MG tablet Take 1 tablet by mouth daily.    . Multiple Vitamins-Minerals (CENTRUM PO) Take 5 tablets by mouth once a week.    . sodium chloride (V-R NASAL SPRAY SALINE) 0.65 % nasal spray Place 1 spray into both nostrils at bedtime.    Alveda Reasons 15 MG TABS tablet Take 1 tablet by mouth daily.     No current facility-administered medications for this visit.    Allergies:   Strawberry extract; Aspirin; Codeine; Other; Penicillin g; and Shellfish-derived products    Social History:  The patient  reports that she has never smoked. She does not have any smokeless tobacco history on file.   Family History:  The patient's family history includes COPD  in her sister; Cancer in her maternal grandfather; Diabetes in her child; Heart disease in her father; Heart disease (age of onset: 61) in her brother and brother; Hypertension in her child and mother; Leukemia in her brother; Parkinson's disease in her sister; Stroke in her maternal grandmother.    ROS:  Please see the history of present illness.   Otherwise, review of systems are positive for none.   All other systems are reviewed and negative.    PHYSICAL EXAM: VS:  BP 138/64 mmHg  Pulse 91 , BMI There is no height or weight on file to calculate BMI. GENERAL:  Well appearing HEENT:  Pupils equal round and reactive, fundi not visualized,  oral mucosa unremarkable NECK:  No jugular venous distention, waveform within normal limits, carotid upstroke brisk and symmetric, no bruits, no thyromegaly LYMPHATICS:  No cervical, inguinal adenopathy LUNGS:  Clear to auscultation bilaterally BACK:  No CVA tenderness CHEST:  Unremarkable HEART:  PMI not displaced or sustained,S1 and S2 within normal limits, no S3, no clicks, no rubs, no murmurs, irregular ABD:  Flat, positive bowel sounds normal in frequency in pitch, no bruits, no rebound, no guarding, no midline pulsatile mass, no hepatomegaly, no splenomegaly EXT:  2 plus pulses throughout, no edema, no cyanosis no clubbing SKIN:  No rashes no nodules NEURO:  Cranial nerves II through XII grossly intact, motor grossly intact throughout PSYCH:  Cognitively intact, oriented to person place and time    EKG:  EKG is not ordered today. The ekg ordered 1/11/17demonstrates atrial fibrillation, rate 92, axis within normal limits, intervals within normal limits, borderline low voltage in limb leads, no acute ST-T wave changes.   Recent Labs: No results found for requested labs within last 365 days.    Lipid Panel No results found for: CHOL, TRIG, HDL, CHOLHDL, VLDL, LDLCALC, LDLDIRECT    Wt Readings from Last 3 Encounters:  No data found for Wt      Other studies Reviewed: Additional studies/ records that were reviewed today include: EKGs, echo report and outside office records. Review of the above records demonstrates:  Please see elsewhere in the note.     ASSESSMENT AND PLAN:  ATRIAL FIB:  I had a long discussion with the patient and her daughters about this dysrhythmia. I do think she symptomatic with this. The rate seems to be reasonably well controlled. I'm going to make sure that she is on the appropriate dose Xarelto. Once she has been on the appropriate dose for 3 weeks we can plan cardioversion. For now she will remain on the meds as listed. I will be checking a BNP  level, basic metabolic profile and TSH.  MR/TR:  These are mild ankle.clinically and repeat echocardiography in the future.   Current medicines are reviewed at length with the patient today.  The patient does not have concerns regarding medicines.  The following changes have been made:  no change  Labs/ tests ordered today include:   Orders Placed This Encounter  Procedures  . Basic Metabolic Panel (BMET)  . B Nat Peptide  . TSH     Disposition:   FU with me at the time of DCCV.     Signed, Minus Breeding, MD  02/13/2016 5:44 PM    [facility]

## 2016-03-06 ENCOUNTER — Emergency Department (HOSPITAL_BASED_OUTPATIENT_CLINIC_OR_DEPARTMENT_OTHER)
Admission: EM | Admit: 2016-03-06 | Discharge: 2016-03-06 | Disposition: A | Discharge status: Home / Self Care | Payer: Medicare Other | Attending: Emergency Medicine | Admitting: Emergency Medicine

## 2016-03-06 ENCOUNTER — Emergency Department (HOSPITAL_BASED_OUTPATIENT_CLINIC_OR_DEPARTMENT_OTHER): Payer: Medicare Other

## 2016-03-06 ENCOUNTER — Encounter (HOSPITAL_BASED_OUTPATIENT_CLINIC_OR_DEPARTMENT_OTHER): Payer: Self-pay | Admitting: *Deleted

## 2016-03-06 ENCOUNTER — Other Ambulatory Visit: Payer: Self-pay | Admitting: Cardiology

## 2016-03-06 ENCOUNTER — Telehealth: Payer: Self-pay | Admitting: Cardiology

## 2016-03-06 DIAGNOSIS — Z8673 Personal history of transient ischemic attack (TIA), and cerebral infarction without residual deficits: Secondary | ICD-10-CM | POA: Diagnosis not present

## 2016-03-06 DIAGNOSIS — I4819 Other persistent atrial fibrillation: Secondary | ICD-10-CM

## 2016-03-06 DIAGNOSIS — Z880 Allergy status to penicillin: Secondary | ICD-10-CM | POA: Insufficient documentation

## 2016-03-06 DIAGNOSIS — Z7901 Long term (current) use of anticoagulants: Secondary | ICD-10-CM | POA: Diagnosis not present

## 2016-03-06 DIAGNOSIS — Z87440 Personal history of urinary (tract) infections: Secondary | ICD-10-CM | POA: Diagnosis not present

## 2016-03-06 DIAGNOSIS — I10 Essential (primary) hypertension: Secondary | ICD-10-CM | POA: Diagnosis not present

## 2016-03-06 DIAGNOSIS — Z859 Personal history of malignant neoplasm, unspecified: Secondary | ICD-10-CM | POA: Insufficient documentation

## 2016-03-06 DIAGNOSIS — Z79899 Other long term (current) drug therapy: Secondary | ICD-10-CM | POA: Insufficient documentation

## 2016-03-06 DIAGNOSIS — R05 Cough: Secondary | ICD-10-CM | POA: Insufficient documentation

## 2016-03-06 DIAGNOSIS — E785 Hyperlipidemia, unspecified: Secondary | ICD-10-CM | POA: Insufficient documentation

## 2016-03-06 DIAGNOSIS — R002 Palpitations: Secondary | ICD-10-CM | POA: Diagnosis present

## 2016-03-06 DIAGNOSIS — Z792 Long term (current) use of antibiotics: Secondary | ICD-10-CM | POA: Diagnosis not present

## 2016-03-06 DIAGNOSIS — K219 Gastro-esophageal reflux disease without esophagitis: Secondary | ICD-10-CM | POA: Insufficient documentation

## 2016-03-06 DIAGNOSIS — R350 Frequency of micturition: Secondary | ICD-10-CM | POA: Diagnosis not present

## 2016-03-06 DIAGNOSIS — I481 Persistent atrial fibrillation: Secondary | ICD-10-CM | POA: Diagnosis not present

## 2016-03-06 LAB — URINALYSIS, ROUTINE W REFLEX MICROSCOPIC
Bilirubin Urine: NEGATIVE
Glucose, UA: 100 mg/dL — AB
KETONES UR: NEGATIVE mg/dL
Nitrite: NEGATIVE
PROTEIN: NEGATIVE mg/dL
Specific Gravity, Urine: 1.011 (ref 1.005–1.030)
pH: 7.5 (ref 5.0–8.0)

## 2016-03-06 LAB — TROPONIN I: Troponin I: <0.03 ng/mL (ref <0.031)

## 2016-03-06 LAB — PROTIME-INR
INR: 1.22 (ref 0.00–1.49)
Prothrombin Time: 15.6 seconds — ABNORMAL HIGH (ref 11.6–15.2)

## 2016-03-06 LAB — CBC WITH DIFFERENTIAL/PLATELET
BASOS ABS: 0 10*3/uL (ref 0.0–0.1)
BASOS PCT: 0 %
EOS ABS: 0.2 10*3/uL (ref 0.0–0.7)
EOS PCT: 2 %
HCT: 38.4 % (ref 36.0–46.0)
Hemoglobin: 12.9 g/dL (ref 12.0–15.0)
LYMPHS PCT: 21 %
Lymphs Abs: 1.7 10*3/uL (ref 0.7–4.0)
MCH: 33.1 pg (ref 26.0–34.0)
MCHC: 33.6 g/dL (ref 30.0–36.0)
MCV: 98.5 fL (ref 78.0–100.0)
Monocytes Absolute: 1.4 10*3/uL — ABNORMAL HIGH (ref 0.1–1.0)
Monocytes Relative: 17 %
Neutro Abs: 4.9 10*3/uL (ref 1.7–7.7)
Neutrophils Relative %: 60 %
PLATELETS: 158 10*3/uL (ref 150–400)
RBC: 3.9 MIL/uL (ref 3.87–5.11)
RDW: 12.9 % (ref 11.5–15.5)
WBC: 8.2 10*3/uL (ref 4.0–10.5)

## 2016-03-06 LAB — LIPASE, BLOOD: LIPASE: 29 U/L (ref 11–51)

## 2016-03-06 LAB — URINE MICROSCOPIC-ADD ON

## 2016-03-06 LAB — BASIC METABOLIC PANEL
ANION GAP: 5 (ref 5–15)
BUN: 9 mg/dL (ref 6–20)
CALCIUM: 8.8 mg/dL — AB (ref 8.9–10.3)
CO2: 28 mmol/L (ref 22–32)
Chloride: 105 mmol/L (ref 101–111)
Creatinine, Ser: 0.67 mg/dL (ref 0.44–1.00)
Glucose, Bld: 99 mg/dL (ref 65–99)
POTASSIUM: 3.9 mmol/L (ref 3.5–5.1)
SODIUM: 138 mmol/L (ref 135–145)

## 2016-03-06 NOTE — ED Notes (Signed)
MD at bedside. 

## 2016-03-06 NOTE — ED Notes (Signed)
Pt on cont cardiac monitoring with freq NBP and cont POX

## 2016-03-06 NOTE — Discharge Instructions (Signed)
Go to your cardiologist's office on Monday for a follow up appointment. Return to ED with new, worsening or concerning symptoms.    Atrial Fibrillation Atrial fibrillation is a type of irregular or rapid heartbeat (arrhythmia). In atrial fibrillation, the heart quivers continuously in a chaotic pattern. This occurs when parts of the heart receive disorganized signals that make the heart unable to pump blood normally. This can increase the risk for stroke, heart failure, and other heart-related conditions. There are different types of atrial fibrillation, including:  Paroxysmal atrial fibrillation. This type starts suddenly, and it usually stops on its own shortly after it starts.  Persistent atrial fibrillation. This type often lasts longer than a week. It may stop on its own or with treatment.  Long-lasting persistent atrial fibrillation. This type lasts longer than 12 months.  Permanent atrial fibrillation. This type does not go away. Talk with your health care provider to learn about the type of atrial fibrillation that you have. CAUSES This condition is caused by some heart-related conditions or procedures, including:  A heart attack.  Coronary artery disease.  Heart failure.  Heart valve conditions.  High blood pressure.  Inflammation of the sac that surrounds the heart (pericarditis).  Heart surgery.  Certain heart rhythm disorders, such as Wolf-Parkinson-White syndrome. Other causes include:  Pneumonia.  Obstructive sleep apnea.  Blockage of an artery in the lungs (pulmonary embolism, or PE).  Lung cancer.  Chronic lung disease.  Thyroid problems, especially if the thyroid is overactive (hyperthyroidism).  Caffeine.  Excessive alcohol use or illegal drug use.  Use of some medicines, including certain decongestants and diet pills. Sometimes, the cause cannot be found. RISK FACTORS This condition is more likely to develop in:  People who are older in  age.  People who smoke.  People who have diabetes mellitus.  People who are overweight (obese).  Athletes who exercise vigorously. SYMPTOMS Symptoms of this condition include:  A feeling that your heart is beating rapidly or irregularly.  A feeling of discomfort or pain in your chest.  Shortness of breath.  Sudden light-headedness or weakness.  Getting tired easily during exercise. In some cases, there are no symptoms. DIAGNOSIS Your health care provider may be able to detect atrial fibrillation when taking your pulse. If detected, this condition may be diagnosed with:  An electrocardiogram (ECG).  A Holter monitor test that records your heartbeat patterns over a 24-hour period.  Transthoracic echocardiogram (TTE) to evaluate how blood flows through your heart.  Transesophageal echocardiogram (TEE) to view more detailed images of your heart.  A stress test.  Imaging tests, such as a CT scan or chest X-ray.  Blood tests. TREATMENT The main goals of treatment are to prevent blood clots from forming and to keep your heart beating at a normal rate and rhythm. The type of treatment that you receive depends on many factors, such as your underlying medical conditions and how you feel when you are experiencing atrial fibrillation. This condition may be treated with:  Medicine to slow down the heart rate, bring the heart's rhythm back to normal, or prevent clots from forming.  Electrical cardioversion. This is a procedure that resets your heart's rhythm by delivering a controlled, low-energy shock to the heart through your skin.  Different types of ablation, such as catheter ablation, catheter ablation with pacemaker, or surgical ablation. These procedures destroy the heart tissues that send abnormal signals. When the pacemaker is used, it is placed under your skin to help your heart  beat in a regular rhythm. HOME CARE INSTRUCTIONS  Take over-the counter and prescription  medicines only as told by your health care provider.  If your health care provider prescribed a blood-thinning medicine (anticoagulant), take it exactly as told. Taking too much blood-thinning medicine can cause bleeding. If you do not take enough blood-thinning medicine, you will not have the protection that you need against stroke and other problems.  Do not use tobacco products, including cigarettes, chewing tobacco, and e-cigarettes. If you need help quitting, ask your health care provider.  If you have obstructive sleep apnea, manage your condition as told by your health care provider.  Do not drink alcohol.  Do not drink beverages that contain caffeine, such as coffee, soda, and tea.  Maintain a healthy weight. Do not use diet pills unless your health care provider approves. Diet pills may make heart problems worse.  Follow diet instructions as told by your health care provider.  Exercise regularly as told by your health care provider.  Keep all follow-up visits as told by your health care provider. This is important. PREVENTION  Avoid drinking beverages that contain caffeine or alcohol.  Avoid certain medicines, especially medicines that are used for breathing problems.  Avoid certain herbs and herbal medicines, such as those that contain ephedra or ginseng.  Do not use illegal drugs, such as cocaine and amphetamines.  Do not smoke.  Manage your high blood pressure. SEEK MEDICAL CARE IF:  You notice a change in the rate, rhythm, or strength of your heartbeat.  You are taking an anticoagulant and you notice increased bruising.  You tire more easily when you exercise or exert yourself. SEEK IMMEDIATE MEDICAL CARE IF:  You have chest pain, abdominal pain, sweating, or weakness.  You feel nauseous.  You notice blood in your vomit, bowel movement, or urine.  You have shortness of breath.  You suddenly have swollen feet and ankles.  You feel dizzy.  You have  sudden weakness or numbness of the face, arm, or leg, especially on one side of the body.  You have trouble speaking, trouble understanding, or both (aphasia).  Your face or your eyelid droops on one side. These symptoms may represent a serious problem that is an emergency. Do not wait to see if the symptoms will go away. Get medical help right away. Call your local emergency services (911 in the [place] Do not drive yourself to the hospital.   This information is not intended to replace advice given to you by your health care provider. Make sure you discuss any questions you have with your health care provider.   Document Released: 11/15/2005 Document Revised: 08/06/2015 Document Reviewed: 03/12/2015 Elsevier Interactive Patient Education [employer].

## 2016-03-06 NOTE — ED Notes (Signed)
Patient c/o increased urinary frequency at night and not getting rest. Also states she has been experiencing intermittent palpations. She has a Hx of A-fib. She states that she has had a lot of anxiety due to deaths in the family and was placed on paxil to help her sleep.

## 2016-03-06 NOTE — ED Notes (Signed)
Pt states she still has palpations , states has some weakness, SOB and dizziness when she gets up in the am, states during the night has been voiding more frequently, not resting well, having dry coughs also

## 2016-03-06 NOTE — ED Provider Notes (Signed)
CSN: [id number]     Arrival date & time 03/06/16  1046 History   First MD Initiated Contact with Patient 03/06/16 1102     Chief Complaint  Patient presents with  . Urinary Frequency   HPI  Sarah Armstrong is an 82 year old female with a past medical history of A. fib on Xarelto and cardizem presenting with increased urinary frequency and palpitations. She reports intermittent palpitations since her cardioversion 2 days ago. She reports associated shortness of breath during the episodes. She denies chest pain, dizziness or syncope with the palpitations. She states that this feels similar to her past episodes of a fib. She is also complaining of increased urinary frequency and difficulty sleeping. She states that she wakes at 3 AM every night and uses the restroom multiple times. She denies dysuria or malodorous urine. She has been followed by urology for chronic urinary frequency and treated multiple times for UTI. She was recently started on Paxil to help her sleep and she is unsure if this has improved her sleep. Her daughters are at bedside and note that she does not sleep well at night and has been fatigued recently. They also note that she has had a dry cough for the past few weeks. Patient denies ear pain, nasal congestion, rhinorrhea or sore throat. Patient was cardioverted 2 days ago and had her Cardizem decreased afterwards. She denies other changes to her medications. She has no other complaints today.  Past Medical History  Diagnosis Date  . Hypertension   . Hyperlipidemia   . Stroke ([place])   . GERD (gastroesophageal reflux disease)   . Cancer ([place]) skin  . Atrial fibrillation [facility])    Past Surgical History  Procedure Laterality Date  . Tonsillectomy  1953  . Total abdominal hysterectomy  1983  . Colonoscopy  2009  . Cataract extraction  2015   Family History  Problem Relation Age of Onset  . Hypertension Mother   . Heart disease Father     CHF  . Heart disease Brother 67  . Stroke  Maternal Grandmother   . Cancer Maternal Grandfather   . Leukemia Brother   . Heart disease Brother 73  . COPD Sister   . Parkinson's disease Sister   . Hypertension Child   . Diabetes Child    Social History  Substance Use Topics  . Smoking status: Never Smoker   . Smokeless tobacco: None  . Alcohol Use: No   OB History    No data available     Review of Systems  Respiratory: Positive for cough.   Cardiovascular: Positive for palpitations.  Genitourinary: Positive for frequency.  All other systems reviewed and are negative.     Allergies  Aspirin; Other; Strawberry extract; Codeine; Enalapril; Penicillin g; and Shellfish-derived products  Home Medications   Prior to Admission medications   Medication Sig Start Date End Date Taking? Authorizing Provider  acetaminophen (TYLENOL) 500 MG tablet Take 500 mg by mouth 2 (two) times daily as needed for mild pain.     Historical Provider, MD  atenolol (TENORMIN) 100 MG tablet Take 1 tablet by mouth daily. 01/19/16   Historical Provider, MD  atorvastatin (LIPITOR) 20 MG tablet Take 1 tablet by mouth daily. 06/26/15   Historical Provider, MD  Cholecalciferol (VITAMIN D3 SUPER STRENGTH) 2000 units TABS Take 1 tablet by mouth daily.    Historical Provider, MD  Cranberry 500 MG CAPS Take 500 mg by mouth daily.    Historical Provider, MD  diazepam (  VALIUM) 5 MG tablet Take 1 tablet by mouth as needed for anxiety.  12/03/15   Historical Provider, MD  diltiazem (CARDIZEM) 120 MG tablet Take 120 mg by mouth daily.    Historical Provider, MD  estradiol (ESTRACE) 0.1 MG/GM vaginal cream Place 1 Applicatorful vaginally 2 (two) times a week. Wed and Sun    Historical Provider, MD  loratadine (CLARITIN) 10 MG tablet Take 1 tablet by mouth daily.    Historical Provider, MD  Multiple Vitamins-Minerals (CENTRUM PO) Take 1 tablet by mouth daily. Not taking on Wed and Sun    Historical Provider, MD  mupirocin ointment (BACTROBAN) 2 % 1 application 2  (two) times daily. 02/18/16   Historical Provider, MD  oxymetazoline (AFRIN) 0.05 % nasal spray Place 1 spray into both nostrils 2 (two) times daily as needed (for nose bleeds).    Historical Provider, MD  PARoxetine (PAXIL) 10 MG tablet Take 10 mg by mouth daily.    Historical Provider, MD  psyllium (REGULOID) 0.52 g capsule Take 0.52 g by mouth daily. 02/22/16   Historical Provider, MD  ranitidine (ZANTAC) 150 MG tablet Take 150 mg by mouth at bedtime.    Historical Provider, MD  rivaroxaban (XARELTO) 20 MG TABS tablet Take 1 tablet (20 mg total) by mouth daily with supper. 02/16/16   Minus Breeding, MD  sodium chloride (V-R NASAL SPRAY SALINE) 0.65 % nasal spray Place 1 spray into both nostrils 5 (five) times daily. As needed for nose bleeds    Historical Provider, MD  traMADol (ULTRAM) 50 MG tablet Take 50 mg by mouth daily as needed.  12/15/15   Historical Provider, MD   BP 144/93 mmHg  Pulse 88  Temp(Src) 98.6 F (37 C) (Oral)  Resp 18  Ht 5\' 7"  (1.702 m)  Wt 80.74 kg  BMI 27.87 kg/m2  SpO2 95% Physical Exam  Constitutional: She appears well-developed and well-nourished. No distress.  HENT:  Head: Normocephalic and atraumatic.  Mouth/Throat: Oropharynx is clear and moist. No oropharyngeal exudate.  Eyes: Conjunctivae and EOM are normal. Right eye exhibits no discharge. Left eye exhibits no discharge. No scleral icterus.  Neck: Normal range of motion. Neck supple.  Cardiovascular: Normal rate, normal heart sounds and intact distal pulses.  An irregularly irregular rhythm present.  Rate controlled a fib. No murmurs  Pulmonary/Chest: Effort normal and breath sounds normal. No respiratory distress. She has no wheezes. She has no rales.  Abdominal: Soft. She exhibits no distension. There is no tenderness.  Musculoskeletal: Normal range of motion.  Neurological: She is alert. Coordination normal.  Skin: Skin is warm and dry.  Psychiatric: She has a normal mood and affect. Her behavior is  normal.  Nursing note and vitals reviewed.   ED Course  Procedures (including critical care time) Labs Review Labs Reviewed  URINALYSIS, ROUTINE W REFLEX MICROSCOPIC (NOT AT [facility]) - Abnormal; Notable for the following:    Glucose, UA 100 (*)    Hgb urine dipstick MODERATE (*)    Leukocytes, UA SMALL (*)    All other components within normal limits  CBC WITH DIFFERENTIAL/PLATELET - Abnormal; Notable for the following:    Monocytes Absolute 1.4 (*)    All other components within normal limits  BASIC METABOLIC PANEL - Abnormal; Notable for the following:    Calcium 8.8 (*)    All other components within normal limits  PROTIME-INR - Abnormal; Notable for the following:    Prothrombin Time 15.6 (*)    All other components  within normal limits  URINE MICROSCOPIC-ADD ON - Abnormal; Notable for the following:    Squamous Epithelial / LPF 0-5 (*)    Bacteria, UA FEW (*)    All other components within normal limits  URINE CULTURE  LIPASE, BLOOD  TROPONIN I    Imaging Review Dg Chest 2 View  03/06/2016  CLINICAL DATA:  Palpitations.  Shortness of breath. EXAM: CHEST  2 VIEW COMPARISON:  February 25, 2016 FINDINGS: The right heart border is obscured on the frontal view with increased density projected over the heart on the lateral view, mildly improved in the interval. No other pulmonary opacities. No nodules or masses. No pneumothorax. The cardiomediastinal silhouette is stable. IMPRESSION: Persistent infiltrate suspected in the right middle lobe, mildly improved in the interval. Recommend short-term follow-up to ensure complete resolution. Electronically Signed   By: Dorise Bullion III M.D   On: 03/06/2016 12:38   I have personally reviewed and evaluated these images and lab results as part of my medical decision-making.   EKG Interpretation   Date/Time:  Saturday March 06 2016 11:04:21 EDT Ventricular Rate:  71 PR Interval:    QRS Duration: 82 QT Interval:  415 QTC Calculation:  451 R Axis:   45 Text Interpretation:  Atrial fibrillation Borderline T abnormalities,  anterior leads similar to EKG from 02/25/16 Confirmed by BELFI  MD, MELANIE  ([id number]) on 03/06/2016 11:11:18 AM      MDM   Final diagnoses:  Persistent atrial fibrillation (HCC)  Urinary frequency   82 year old female presenting with palpitations and urinary frequency. Patient was cardioverted 2 days ago and had her Cardizem dose decreased. Intermittent palpitations and shortness of breath sounds. Patient has chronic issues with urinary frequency and has been evaluated by urology for this in the past. Afebrile and hemodynamically stable. Heart rate ranges from 65-92 while in the emergency department. Heart rate is irregular patient is in A. fib on the monitor. Lungs clear to auscultation bilaterally. Abdomen soft, nontender. No leukocytosis. Urine with small leukocytes and few bacteria. Patient's daughter is at bedside states that her urine always looks like this but never grows out bacteria on culture. Will send urine for culture and hold treatment for now as this seems to be a chronic problem. Chest x-ray shows a persistent infiltrate in the right middle lobe that is improved since the last chest x-ray. Patient's cousin patient is not consistent with a pneumonia-like illness. Radiology recommends short-term follow-up which will be monitored by her cardiologist. Consulted Dr. Radford Pax with cardiology who performed her cardioversion. She recommends outpatient follow-up on Monday and no change to her medications currently. Discussed this with patient and daughter is at bedside who state understanding and agree with the plan. Patient is well appearing and appropriate for outpatient follow-up. I discussed reasons to return immediately to the emergency department. Patient is stable for discharge.    Josephina Gip, PA-C 03/06/16 [place], MD 03/07/16 [phone]

## 2016-03-06 NOTE — Telephone Encounter (Signed)
Patient presented to [facility] with palpitations and was noted to be back in atrial fibrillation with HR 65bpm.  She is s/p DCCV last Thursday.  All other workup normal.  BP and HR stable.  Instructed patient to call office on Monday for appt to discuss further treatment.  Please set up Appt with Dr. Percival Spanish next week

## 2016-03-07 NOTE — Telephone Encounter (Signed)
Please call her and set up an appt in the atrial fib clinic as I am not in the clinic for a few weeks.

## 2016-03-08 ENCOUNTER — Ambulatory Visit (HOSPITAL_COMMUNITY)
Admission: RE | Admit: 2016-03-08 | Discharge: 2016-03-08 | Disposition: A | Discharge status: Home / Self Care | Payer: Medicare Other | Source: Ambulatory Visit | Attending: Nurse Practitioner | Admitting: Nurse Practitioner

## 2016-03-08 ENCOUNTER — Encounter (HOSPITAL_COMMUNITY): Payer: Self-pay | Admitting: Nurse Practitioner

## 2016-03-08 ENCOUNTER — Telehealth: Payer: Self-pay | Admitting: Cardiology

## 2016-03-08 VITALS — BP 138/82 | HR 83 | Ht 67.0 in | Wt 176.8 lb

## 2016-03-08 DIAGNOSIS — Z885 Allergy status to narcotic agent status: Secondary | ICD-10-CM | POA: Insufficient documentation

## 2016-03-08 DIAGNOSIS — Z8673 Personal history of transient ischemic attack (TIA), and cerebral infarction without residual deficits: Secondary | ICD-10-CM | POA: Insufficient documentation

## 2016-03-08 DIAGNOSIS — K219 Gastro-esophageal reflux disease without esophagitis: Secondary | ICD-10-CM | POA: Diagnosis not present

## 2016-03-08 DIAGNOSIS — I10 Essential (primary) hypertension: Secondary | ICD-10-CM | POA: Insufficient documentation

## 2016-03-08 DIAGNOSIS — Z8249 Family history of ischemic heart disease and other diseases of the circulatory system: Secondary | ICD-10-CM | POA: Insufficient documentation

## 2016-03-08 DIAGNOSIS — E785 Hyperlipidemia, unspecified: Secondary | ICD-10-CM | POA: Diagnosis not present

## 2016-03-08 DIAGNOSIS — Z79899 Other long term (current) drug therapy: Secondary | ICD-10-CM | POA: Diagnosis not present

## 2016-03-08 DIAGNOSIS — Z888 Allergy status to other drugs, medicaments and biological substances status: Secondary | ICD-10-CM | POA: Insufficient documentation

## 2016-03-08 DIAGNOSIS — Z7901 Long term (current) use of anticoagulants: Secondary | ICD-10-CM | POA: Diagnosis not present

## 2016-03-08 DIAGNOSIS — Z833 Family history of diabetes mellitus: Secondary | ICD-10-CM | POA: Diagnosis not present

## 2016-03-08 DIAGNOSIS — I481 Persistent atrial fibrillation: Secondary | ICD-10-CM | POA: Diagnosis present

## 2016-03-08 DIAGNOSIS — I4819 Other persistent atrial fibrillation: Secondary | ICD-10-CM

## 2016-03-08 LAB — URINE CULTURE: SPECIAL REQUESTS: NORMAL

## 2016-03-08 LAB — HEPATIC FUNCTION PANEL
ALK PHOS: 62 U/L (ref 38–126)
ALT: 16 U/L (ref 14–54)
AST: 22 U/L (ref 15–41)
Albumin: 3.6 g/dL (ref 3.5–5.0)
BILIRUBIN INDIRECT: 1 mg/dL — AB (ref 0.3–0.9)
Bilirubin, Direct: 0.1 mg/dL (ref 0.1–0.5)
Total Bilirubin: 1.1 mg/dL (ref 0.3–1.2)
Total Protein: 6.6 g/dL (ref 6.5–8.1)

## 2016-03-08 MED ORDER — ATENOLOL 50 MG PO TABS: 50.0000 mg | ORAL_TABLET | Freq: Two times a day (BID) | ORAL | Status: DC

## 2016-03-08 MED ORDER — AMIODARONE HCL 200 MG PO TABS: 200.0000 mg | ORAL_TABLET | Freq: Every day | ORAL | Status: DC

## 2016-03-08 MED ORDER — AMIODARONE HCL 200 MG PO TABS: 200.0000 mg | ORAL_TABLET | Freq: Two times a day (BID) | ORAL | Status: DC

## 2016-03-08 NOTE — Patient Instructions (Addendum)
Your physician has recommended you make the following change in your medication:  1)Amiodarone 200mg  -- take 1 tablet by mouth once a day

## 2016-03-08 NOTE — Telephone Encounter (Signed)
New message   Sarah Armstrong calling to speak to rn for a hospital update 03/09/16

## 2016-03-08 NOTE — Telephone Encounter (Signed)
Patient saw D. Kayleen Memos, NP in AF clinic 4/10 @ 1030am

## 2016-03-08 NOTE — Telephone Encounter (Signed)
Dr. Theodosia Blender, Dr. Rosezella [place] notes reviewed. Discussed w/ caller. Discussed w/ Stacy - Arranged for f/u at A Fib clinic this morning. Gave caller directions and contact information for clinic.

## 2016-03-08 NOTE — Progress Notes (Signed)
Patient ID: Sarah Armstrong, female   DOB: [birth year], 82 y.o.   MRN: [id number]      Primary Care Physician: Pcp Not In System Referring Physician: Dr. Pricilla Larsson is a 82 y.o. female with a h/o afib newly diagnosed in January of this year. Her husband was very ill and died in 17-Jan-2023. Noticed palpitations a few weeks before diagnose.Had work up with cardiology in [place], where she lives, with stress test and echo,with echo showing normal EF and mild MR/TR and stress test low risk scan. She was started on xarelto 20 mg x 3 weeks and then cardioverted but unfortunately, was only in SR x one day, not long enough to see if pt felt any better. She is currently on Cardizem and atenolol for rate control and she is rate controlled today at 83 bpm. She presented to the ED for urinary symptoms's,4/8, U/A borderline and culture ordered. When culture returned, it recommended collecting  another sample due multiple species present. Since in afib pt has noticed being weaker and more winded with activities. She is accompanied with her two daughters today, who live in this area, one of which is a Marine scientist.  Today, she denies symptoms of palpitations, chest pain,orthopnea, PND, lower extremity edema, dizziness, presyncope, syncope, or neurologic sequela.  Positive for dyspnea and fatigue. Some symptoms of mild snoring. The patient is tolerating medications without difficulties and is otherwise without complaint today.   Past Medical History  Diagnosis Date  . Hypertension   . Hyperlipidemia   . Stroke ([place])   . GERD (gastroesophageal reflux disease)   . Cancer ([place]) skin  . Atrial fibrillation [facility])    Past Surgical History  Procedure Laterality Date  . Tonsillectomy  1953  . Total abdominal hysterectomy  1983  . Colonoscopy  2009  . Cataract extraction  2015    Current Outpatient Prescriptions  Medication Sig Dispense Refill  . acetaminophen (TYLENOL) 500 MG tablet Take 500 mg by mouth 2 (two)  times daily as needed for mild pain.     Marland Kitchen atenolol (TENORMIN) 50 MG tablet Take 1 tablet (50 mg total) by mouth 2 (two) times daily. 60 tablet 3  . atorvastatin (LIPITOR) 20 MG tablet Take 1 tablet by mouth daily.    . Cholecalciferol (VITAMIN D3 SUPER STRENGTH) 2000 units TABS Take 1 tablet by mouth daily.    . Cranberry 500 MG CAPS Take 500 mg by mouth daily.    . diazepam (VALIUM) 5 MG tablet Take 1 tablet by mouth as needed for anxiety.     Marland Kitchen diltiazem (CARDIZEM) 120 MG tablet Take 120 mg by mouth daily.    Marland Kitchen esomeprazole (NEXIUM) 20 MG capsule Take 20 mg by mouth daily at 12 noon.    Marland Kitchen estradiol (ESTRACE) 0.1 MG/GM vaginal cream Place 1 Applicatorful vaginally 2 (two) times a week. Wed and Sun    . loratadine (CLARITIN) 10 MG tablet Take 1 tablet by mouth daily.    . Multiple Vitamins-Minerals (CENTRUM PO) Take 1 tablet by mouth daily. Not taking on Wed and Sun    . mupirocin ointment (BACTROBAN) 2 % 1 application 2 (two) times daily.    Marland Kitchen oxymetazoline (AFRIN) 0.05 % nasal spray Place 1 spray into both nostrils 2 (two) times daily as needed (for nose bleeds).    Marland Kitchen PARoxetine (PAXIL) 10 MG tablet Take 10 mg by mouth daily.    . psyllium (REGULOID) 0.52 g capsule Take 0.52 g by mouth daily.    Marland Kitchen  rivaroxaban (XARELTO) 20 MG TABS tablet Take 1 tablet (20 mg total) by mouth daily with supper. 30 tablet 11  . sodium chloride (V-R NASAL SPRAY SALINE) 0.65 % nasal spray Place 1 spray into both nostrils 5 (five) times daily. As needed for nose bleeds    . traMADol (ULTRAM) 50 MG tablet Take 50 mg by mouth daily as needed.   0  . amiodarone (PACERONE) 200 MG tablet Take 1 tablet (200 mg total) by mouth daily. 30 tablet 3   No current facility-administered medications for this encounter.    Allergies  Allergen Reactions  . Aspirin Anaphylaxis and Swelling    Throat  . Other Anaphylaxis and Swelling    Walnuts, throat   . Strawberry Extract Anaphylaxis  . Codeine Other (See Comments)     hallucinations  . Enalapril Other (See Comments)    Cough, low blood pressure  . Penicillin G Other (See Comments)    Does not help symptoms  . Shellfish-Derived Products Other (See Comments)    From allergy testing, IVP dye is fine    Social History   Social History  . Marital Status: Unknown    Spouse Name: N/A  . Number of Children: 2  . Years of Education: N/A   Occupational History  . Not on file.   Social History Main Topics  . Smoking status: Never Smoker   . Smokeless tobacco: Not on file  . Alcohol Use: No  . Drug Use: No  . Sexual Activity: Not on file   Other Topics Concern  . Not on file   Social History Narrative   Lives alone.  Husband died 03/11/16    Family History  Problem Relation Age of Onset  . Hypertension Mother   . Heart disease Father     CHF  . Heart disease Brother 41  . Stroke Maternal Grandmother   . Cancer Maternal Grandfather   . Leukemia Brother   . Heart disease Brother 30  . COPD Sister   . Parkinson's disease Sister   . Hypertension Child   . Diabetes Child     ROS- All systems are reviewed and negative except as per the HPI above  Physical Exam: Filed Vitals:   03/08/16 1036  BP: 138/82  Pulse: 83  Height: 5\' 7"  (1.702 m)  Weight: 176 lb 12.8 oz (80.196 kg)    GEN- The patient is well appearing, alert and oriented x 3 today.   Head- normocephalic, atraumatic Eyes-  Sclera clear, conjunctiva pink Ears- hearing intact Oropharynx- clear Neck- supple, no JVP Lymph- no cervical lymphadenopathy Lungs- Clear to ausculation bilaterally, normal work of breathing Heart- irregular rate and rhythm, no murmurs, rubs or gallops, PMI not laterally displaced GI- soft, NT, ND, + BS Extremities- no clubbing, cyanosis, or edema MS- no significant deformity or atrophy Skin- no rash or lesion Psych- euthymic mood, full affect Neuro- strength and sensation are intact  EKG- afib at 83 bpm, qrs int 76 ms, qtc 458 ms Epic records  reviewed Cardiac tests, echo, stress test, recently done in [place], reviewed  Assessment and Plan: 1. Persistent afib with failed cardioversion 4/6 Discussed antiarrythmic's with pt and daughters and they would like to proceed with amiodarone but at low dose ( 200 mg qd instead of 200 mg bid) and try cardioversion in one month. She will continue on xarelto 20 mg with chadsvasc score of at least 5 with out any missed doses. Return next week for ekg on amiodarone TSH  recently checked for baseline and will get liver panel for baseline as well Urine obtained for repeat culture(from recent ER visit) as a courtesy and will forward results to pcp.   Daughter will let me know if heart rate start trending too low on amiodarone(below 60) and if so, wil decrease atenolol to 25 mg bid.  Will see back in one month and set up for cardioversion  Butch Penny C. Jakeia Carreras, [facility] [address] [place], [place] [address] [phone]

## 2016-03-09 LAB — URINE CULTURE: Culture: 1000 — AB

## 2016-03-15 ENCOUNTER — Ambulatory Visit (HOSPITAL_COMMUNITY)
Admission: RE | Admit: 2016-03-15 | Discharge: 2016-03-15 | Disposition: A | Discharge status: Home / Self Care | Payer: Medicare Other | Source: Ambulatory Visit | Attending: Nurse Practitioner | Admitting: Nurse Practitioner

## 2016-03-15 DIAGNOSIS — I480 Paroxysmal atrial fibrillation: Secondary | ICD-10-CM

## 2016-03-15 DIAGNOSIS — I4891 Unspecified atrial fibrillation: Secondary | ICD-10-CM | POA: Insufficient documentation

## 2016-03-15 DIAGNOSIS — Z79899 Other long term (current) drug therapy: Secondary | ICD-10-CM | POA: Insufficient documentation

## 2016-03-15 NOTE — Progress Notes (Addendum)
Pt in for EKG only after starting Amiodarone 200mg  daily. Roderic Palau NP to review EKG  EKG shows afib at 83 bpm, QRS int 74 ms, Qtc 470 ms. Continue amiodarone at 200 mg a day.  Has f/u appointment in early May to discuss cardioversion after sufficient loading of amiodarone. Continue anticoagulant, xarelto 20 mg, without missed doses.

## 2016-03-26 ENCOUNTER — Ambulatory Visit: Payer: Medicare Other | Admitting: Physician Assistant

## 2016-04-05 ENCOUNTER — Encounter (HOSPITAL_COMMUNITY): Payer: Self-pay | Admitting: Nurse Practitioner

## 2016-04-05 ENCOUNTER — Ambulatory Visit (HOSPITAL_COMMUNITY)
Admission: RE | Admit: 2016-04-05 | Discharge: 2016-04-05 | Disposition: A | Discharge status: Home / Self Care | Payer: Medicare Other | Source: Ambulatory Visit | Attending: Nurse Practitioner | Admitting: Nurse Practitioner

## 2016-04-05 VITALS — BP 118/58 | HR 66 | Ht 67.0 in | Wt 170.4 lb

## 2016-04-05 DIAGNOSIS — I10 Essential (primary) hypertension: Secondary | ICD-10-CM | POA: Insufficient documentation

## 2016-04-05 DIAGNOSIS — Z0189 Encounter for other specified special examinations: Secondary | ICD-10-CM | POA: Insufficient documentation

## 2016-04-05 DIAGNOSIS — K219 Gastro-esophageal reflux disease without esophagitis: Secondary | ICD-10-CM | POA: Diagnosis not present

## 2016-04-05 DIAGNOSIS — E785 Hyperlipidemia, unspecified: Secondary | ICD-10-CM | POA: Diagnosis not present

## 2016-04-05 DIAGNOSIS — Z85828 Personal history of other malignant neoplasm of skin: Secondary | ICD-10-CM | POA: Diagnosis not present

## 2016-04-05 DIAGNOSIS — N390 Urinary tract infection, site not specified: Secondary | ICD-10-CM | POA: Diagnosis not present

## 2016-04-05 DIAGNOSIS — Z8673 Personal history of transient ischemic attack (TIA), and cerebral infarction without residual deficits: Secondary | ICD-10-CM | POA: Diagnosis not present

## 2016-04-05 DIAGNOSIS — I481 Persistent atrial fibrillation: Secondary | ICD-10-CM | POA: Insufficient documentation

## 2016-04-05 DIAGNOSIS — I4891 Unspecified atrial fibrillation: Secondary | ICD-10-CM | POA: Insufficient documentation

## 2016-04-05 DIAGNOSIS — I4819 Other persistent atrial fibrillation: Secondary | ICD-10-CM

## 2016-04-05 DIAGNOSIS — Z880 Allergy status to penicillin: Secondary | ICD-10-CM | POA: Diagnosis not present

## 2016-04-05 MED ORDER — DILTIAZEM HCL 120 MG PO TABS: 120.0000 mg | ORAL_TABLET | Freq: Every day | ORAL | Status: DC

## 2016-04-05 MED ORDER — AMIODARONE HCL 200 MG PO TABS: 200.0000 mg | ORAL_TABLET | Freq: Every day | ORAL | Status: DC

## 2016-04-05 MED ORDER — RIVAROXABAN 20 MG PO TABS: 20.0000 mg | ORAL_TABLET | Freq: Every day | ORAL | Status: DC

## 2016-04-05 NOTE — Progress Notes (Signed)
Patient ID: [name], female   DOB: 03-21-1931, 82 y.o.   MRN: [id number]      Primary Care Physician: Pcp Not In System Referring Physician: Dr. Pricilla Larsson is a 82 y.o. female with a h/o afib newly diagnosed in January of this year. Her husband was very ill and died in December 31, 2022. Noticed palpitations a few weeks before diagnose.Had work up with cardiology in [place], where she lives, with stress test and echo,with echo showing normal EF and mild MR/TR and stress test low risk scan. She was started on xarelto 20 mg x 3 weeks and then cardioverted but unfortunately, was only in SR x one day, not long enough to see if pt felt any better. She is currently on Cardizem and atenolol for rate control and she is rate controlled today at 83 bpm. She presented to the ED for urinary symptoms's,4/8, U/A borderline and culture ordered. When culture returned, it recommended collecting  another sample due multiple species present. Since in afib pt has noticed being weaker and more winded with activities. She is accompanied with her two daughters today, who live in this area, one of which is a Marine scientist.  Returns 5/8 and is in [place]. She is happy that she has not felt as many palpitations and is some stronger but she still c/o's of nausea, waxing/waning during the day, lack of appetitive and weight loss. These symptoms preceded  use of amiodarone and the daughter wonders if are not somewhat from depression, with her husband dying in 31-Dec-2022 and living by herself.. She is also fighting another bladder infection and is on cipro.She states when she gets up to use the bathroom at night, that she does not notice any nausea, worse once first thing in the am. Her PCP told her to drink ensure which she is doing. Continues with xarelto.  Today, she denies symptoms of palpitations, chest pain,orthopnea, PND, lower extremity edema, dizziness, presyncope, syncope, or neurologic sequela.  Positive for weakness, nausea, weight  loss, urinary frequency. Some symptoms of mild snoring. The patient is tolerating medications without difficulties and is otherwise without complaint today.   Past Medical History  Diagnosis Date  . Hypertension   . Hyperlipidemia   . Stroke ([place])   . GERD (gastroesophageal reflux disease)   . Cancer ([place]) skin  . Atrial fibrillation [facility])    Past Surgical History  Procedure Laterality Date  . Tonsillectomy  1953  . Total abdominal hysterectomy  1983  . Colonoscopy  2009  . Cataract extraction  2015  . Cardioversion N/A 03/04/2016    Procedure: CARDIOVERSION;  Surgeon: Sueanne Margarita, MD;  Location: [facility];  Service: Cardiovascular;  Laterality: N/A;    Current Outpatient Prescriptions  Medication Sig Dispense Refill  . acetaminophen (TYLENOL) 500 MG tablet Take 500 mg by mouth 2 (two) times daily as needed for mild pain.     Marland Kitchen amiodarone (PACERONE) 200 MG tablet Take 1 tablet (200 mg total) by mouth daily. 90 tablet 3  . atenolol (TENORMIN) 50 MG tablet Take 1 tablet (50 mg total) by mouth 2 (two) times daily. 60 tablet 3  . atorvastatin (LIPITOR) 20 MG tablet Take 1 tablet by mouth daily.    . Cholecalciferol (VITAMIN D3 SUPER STRENGTH) 2000 units TABS Take 1 tablet by mouth daily.    . Cranberry 500 MG CAPS Take 500 mg by mouth daily.    . diazepam (VALIUM) 5 MG tablet Take 1 tablet by mouth as needed for  anxiety.     Marland Kitchen diltiazem (CARDIZEM) 120 MG tablet Take 1 tablet (120 mg total) by mouth daily. 90 tablet 3  . esomeprazole (NEXIUM) 20 MG capsule Take 20 mg by mouth daily at 12 noon.    Marland Kitchen estradiol (ESTRACE) 0.1 MG/GM vaginal cream Place 1 Applicatorful vaginally 2 (two) times a week. Wed and Sun    . loratadine (CLARITIN) 10 MG tablet Take 1 tablet by mouth daily.    . Multiple Vitamins-Minerals (CENTRUM PO) Take 1 tablet by mouth daily. Not taking on Wed and Sun    . mupirocin ointment (BACTROBAN) 2 % 1 application 2 (two) times daily.    Marland Kitchen oxymetazoline (AFRIN) 0.05 %  nasal spray Place 1 spray into both nostrils 2 (two) times daily as needed (for nose bleeds).    Marland Kitchen PARoxetine (PAXIL) 10 MG tablet Take 10 mg by mouth daily.    . psyllium (REGULOID) 0.52 g capsule Take 0.52 g by mouth daily.    . rivaroxaban (XARELTO) 20 MG TABS tablet Take 1 tablet (20 mg total) by mouth daily with supper. 90 tablet 3  . sodium chloride (V-R NASAL SPRAY SALINE) 0.65 % nasal spray Place 1 spray into both nostrils 5 (five) times daily. As needed for nose bleeds    . sulfamethoxazole-trimethoprim (BACTRIM,SEPTRA) 400-80 MG tablet Take 1 tablet by mouth 2 (two) times daily.    . traMADol (ULTRAM) 50 MG tablet Take 50 mg by mouth daily as needed.   0   No current facility-administered medications for this encounter.    Allergies  Allergen Reactions  . Aspirin Anaphylaxis and Swelling    Throat  . Other Anaphylaxis and Swelling    Walnuts, throat   . Strawberry Extract Anaphylaxis  . Codeine Other (See Comments)    hallucinations  . Enalapril Other (See Comments)    Cough, low blood pressure  . Penicillin G Other (See Comments)    Does not help symptoms  . Shellfish-Derived Products Other (See Comments)    From allergy testing, IVP dye is fine    Social History   Social History  . Marital Status: Unknown    Spouse Name: N/A  . Number of Children: 2  . Years of Education: N/A   Occupational History  . Not on file.   Social History Main Topics  . Smoking status: Never Smoker   . Smokeless tobacco: Not on file  . Alcohol Use: No  . Drug Use: No  . Sexual Activity: Not on file   Other Topics Concern  . Not on file   Social History Narrative   Lives alone.  Husband died 03-09-16    Family History  Problem Relation Age of Onset  . Hypertension Mother   . Heart disease Father     CHF  . Heart disease Brother 69  . Stroke Maternal Grandmother   . Cancer Maternal Grandfather   . Leukemia Brother   . Heart disease Brother 5  . COPD Sister   .  Parkinson's disease Sister   . Hypertension Child   . Diabetes Child     ROS- All systems are reviewed and negative except as per the HPI above  Physical Exam: Filed Vitals:   04/05/16 1039  BP: 118/58  Pulse: 66  Height: 5\' 7"  (1.702 m)  Weight: 170 lb 6.4 oz (77.293 kg)    GEN- The patient is well appearing, alert and oriented x 3 today.   Head- normocephalic, atraumatic Eyes-  Sclera clear,  conjunctiva pink Ears- hearing intact Oropharynx- clear Neck- supple, no JVP Lymph- no cervical lymphadenopathy Lungs- Clear to ausculation bilaterally, normal work of breathing Heart- regular rate and rhythm, no murmurs, rubs or gallops, PMI not laterally displaced GI- soft, NT, ND, + BS Extremities- no clubbing, cyanosis, or edema MS- no significant deformity or atrophy Skin- no rash or lesion Psych- euthymic mood, full affect Neuro- strength and sensation are intact  EKG- NSR at 66 bpm, pr int 178 ms, qrs int 74 ms, qtc 459 ms Epic records reviewed Cardiac tests, echo, stress test, recently done in [place], reviewed  Assessment and Plan: 1. Persistent afib with failed cardioversion 4/6 Continue on amiodarone 200 mg qd with now in SR She will continue on xarelto 20 mg with chadsvasc score of at least 5 with out any missed doses Unsure of etiology of nausea, lack of appetite, weight loss but preceded amiodarone so I don't think it is related to drug. F/u per PCP in 3 weeks as scheduled for f/u UTI and instructed to discuss GI issues at that time. Will see back in one month with screening Liver, TSH.  Geroge Baseman Kerston Landeck, [facility] [address] [place], [place] [address] [phone]

## 2016-04-15 ENCOUNTER — Other Ambulatory Visit (HOSPITAL_COMMUNITY): Payer: Self-pay | Admitting: *Deleted

## 2016-04-15 MED ORDER — AMIODARONE HCL 200 MG PO TABS: 200.0000 mg | ORAL_TABLET | Freq: Every day | ORAL | Status: DC

## 2016-05-04 ENCOUNTER — Encounter (HOSPITAL_COMMUNITY): Payer: Self-pay | Admitting: Nurse Practitioner

## 2016-05-04 ENCOUNTER — Ambulatory Visit (HOSPITAL_COMMUNITY)
Admission: RE | Admit: 2016-05-04 | Discharge: 2016-05-04 | Disposition: A | Discharge status: Home / Self Care | Payer: Medicare Other | Source: Ambulatory Visit | Attending: Nurse Practitioner | Admitting: Nurse Practitioner

## 2016-05-04 ENCOUNTER — Other Ambulatory Visit (HOSPITAL_COMMUNITY): Payer: Self-pay | Admitting: *Deleted

## 2016-05-04 VITALS — BP 118/62 | HR 59 | Ht 67.0 in | Wt 169.0 lb

## 2016-05-04 DIAGNOSIS — Z79899 Other long term (current) drug therapy: Secondary | ICD-10-CM | POA: Diagnosis not present

## 2016-05-04 DIAGNOSIS — Z888 Allergy status to other drugs, medicaments and biological substances status: Secondary | ICD-10-CM | POA: Insufficient documentation

## 2016-05-04 DIAGNOSIS — Z8249 Family history of ischemic heart disease and other diseases of the circulatory system: Secondary | ICD-10-CM | POA: Insufficient documentation

## 2016-05-04 DIAGNOSIS — K219 Gastro-esophageal reflux disease without esophagitis: Secondary | ICD-10-CM | POA: Diagnosis not present

## 2016-05-04 DIAGNOSIS — I10 Essential (primary) hypertension: Secondary | ICD-10-CM | POA: Diagnosis not present

## 2016-05-04 DIAGNOSIS — Z885 Allergy status to narcotic agent status: Secondary | ICD-10-CM | POA: Insufficient documentation

## 2016-05-04 DIAGNOSIS — I481 Persistent atrial fibrillation: Secondary | ICD-10-CM

## 2016-05-04 DIAGNOSIS — Z833 Family history of diabetes mellitus: Secondary | ICD-10-CM | POA: Diagnosis not present

## 2016-05-04 DIAGNOSIS — Z7901 Long term (current) use of anticoagulants: Secondary | ICD-10-CM | POA: Insufficient documentation

## 2016-05-04 DIAGNOSIS — Z880 Allergy status to penicillin: Secondary | ICD-10-CM | POA: Diagnosis not present

## 2016-05-04 DIAGNOSIS — Z8673 Personal history of transient ischemic attack (TIA), and cerebral infarction without residual deficits: Secondary | ICD-10-CM | POA: Diagnosis not present

## 2016-05-04 DIAGNOSIS — E785 Hyperlipidemia, unspecified: Secondary | ICD-10-CM | POA: Insufficient documentation

## 2016-05-04 DIAGNOSIS — I4819 Other persistent atrial fibrillation: Secondary | ICD-10-CM

## 2016-05-04 LAB — COMPREHENSIVE METABOLIC PANEL
ALBUMIN: 3.5 g/dL (ref 3.5–5.0)
ALK PHOS: 66 U/L (ref 38–126)
ALT: 19 U/L (ref 14–54)
ANION GAP: 8 (ref 5–15)
AST: 29 U/L (ref 15–41)
BILIRUBIN TOTAL: 1.2 mg/dL (ref 0.3–1.2)
BUN: 15 mg/dL (ref 6–20)
CALCIUM: 9.4 mg/dL (ref 8.9–10.3)
CO2: 27 mmol/L (ref 22–32)
CREATININE: 1.1 mg/dL — AB (ref 0.44–1.00)
Chloride: 102 mmol/L (ref 101–111)
GFR calc non Af Amer: 45 mL/min — ABNORMAL LOW (ref >60)
GFR, EST AFRICAN AMERICAN: 53 mL/min — AB (ref >60)
GLUCOSE: 130 mg/dL — AB (ref 65–99)
Potassium: 4.1 mmol/L (ref 3.5–5.1)
Sodium: 137 mmol/L (ref 135–145)
TOTAL PROTEIN: 6.7 g/dL (ref 6.5–8.1)

## 2016-05-04 LAB — TSH: TSH: 2.227 u[IU]/mL (ref 0.350–4.500)

## 2016-05-04 MED ORDER — RIVAROXABAN 15 MG PO TABS: 15.0000 mg | ORAL_TABLET | Freq: Every day | ORAL | Status: AC

## 2016-05-04 NOTE — Patient Instructions (Signed)
Your physician wants you to follow-up in: 3 months with Dr. Hochrein. You will receive a reminder letter in the mail two months in advance. If you don't receive a letter, please call our office to schedule the follow-up appointment.  

## 2016-05-04 NOTE — Progress Notes (Signed)
Patient ID: Sarah Armstrong, female   DOB: Nov 21, 1931, 82 y.o.   MRN: [id number]      Primary Care Physician: Pcp Not In System([facility]) Referring Physician: Dr. Pricilla Larsson is a 82 y.o. female  Being seen in [facility], 4/10, with a h/o afib newly diagnosed in January of this year. Her husband was very ill and died in January 10, 2023. Noticed palpitations a few weeks before diagnosis.Had work up with cardiology in [place], where she lives, with stress test and echo,with echo showing normal EF and mild MR/TR and stress test low risk scan. She was started on xarelto 20 mg x 3 weeks and then cardioverted but unfortunately, was only in SR x one day, not long enough to see if pt felt any better. She is currently on Cardizem and atenolol for rate control and she is rate controlled today at 83 bpm. She presented to the ED for urinary symptoms's,4/8, U/A borderline and culture ordered. When culture returned, it recommended collecting  another sample due multiple species present. Since in afib pt has noticed being weaker and more winded with activities. She is accompanied with her two daughters today, who live in this area, one of which is a Marine scientist.  Returns 5/8 and is in [place] after taking amiodarone 200 mg for several weeks.. She is happy that she has not felt as many palpitations and is some stronger but she still c/o's of nausea, waxing/waning during the day, lack of appetitive and weight loss. These symptoms preceded  use of amiodarone and the daughter wonders if are not somewhat from depression, with her husband dying in 01/10/2023 and living by herself. She is also fighting another bladder infection and is on cipro.She states when she gets up to use the bathroom at night, that she does not notice any nausea, worse once first thing in the am. Her PCP told her to drink ensure which she is doing. Continues with xarelto.  Returns to [place] clinic 6/6 and looks well. Her pcp thought a lot of her GI complaints were  secondary to grief and started her on paxil and appears to have helped. Appetite is better and nausea has resolved. Has stayed in [place] and is having nosebleed issues. Nose has been cauterized twice in last 4-8 weeks.  Today, she denies symptoms of palpitations, chest pain,orthopnea, PND, lower extremity edema, dizziness, presyncope, syncope, or neurologic sequela. Some symptoms of mild snoring. The patient is tolerating medications without difficulties and is otherwise without complaint today.   Past Medical History  Diagnosis Date  . Hypertension   . Hyperlipidemia   . Stroke ([place])   . GERD (gastroesophageal reflux disease)   . Cancer ([place]) skin  . Atrial fibrillation [facility])    Past Surgical History  Procedure Laterality Date  . Tonsillectomy  1953  . Total abdominal hysterectomy  1983  . Colonoscopy  2009  . Cataract extraction  2015  . Cardioversion N/A 03/04/2016    Procedure: CARDIOVERSION;  Surgeon: Sueanne Margarita, MD;  Location: [facility];  Service: Cardiovascular;  Laterality: N/A;    Current Outpatient Prescriptions  Medication Sig Dispense Refill  . acetaminophen (TYLENOL) 500 MG tablet Take 500 mg by mouth 2 (two) times daily as needed for mild pain.     Marland Kitchen amiodarone (PACERONE) 200 MG tablet Take 1 tablet (200 mg total) by mouth daily. 90 tablet 3  . atenolol (TENORMIN) 50 MG tablet Take 1 tablet (50 mg total) by mouth 2 (two) times daily. [place]  tablet 3  . atorvastatin (LIPITOR) 20 MG tablet Take 1 tablet by mouth daily.    . cetirizine (ZYRTEC) 10 MG tablet Take 10 mg by mouth daily.    . Cholecalciferol (VITAMIN D3 SUPER STRENGTH) 2000 units TABS Take 1 tablet by mouth daily.    . Cranberry 500 MG CAPS Take 500 mg by mouth daily.    . diazepam (VALIUM) 5 MG tablet Take 1 tablet by mouth as needed for anxiety.     Marland Kitchen diltiazem (CARDIZEM) 120 MG tablet Take 1 tablet (120 mg total) by mouth daily. 90 tablet 3  . esomeprazole (NEXIUM) 20 MG capsule Take 20 mg by mouth every  other day.     . estradiol (ESTRACE) 0.1 MG/GM vaginal cream Place 1 Applicatorful vaginally 2 (two) times a week. Wed and Sun    . furosemide (LASIX) 20 MG tablet Take 20 mg by mouth daily.    . Melatonin 5 MG CAPS Take 5 mg by mouth at bedtime.    . Multiple Vitamins-Minerals (CENTRUM PO) Take 1 tablet by mouth daily. Not taking on Wed and Sun    . mupirocin ointment (BACTROBAN) 2 % 1 application 2 (two) times daily.    Marland Kitchen oxymetazoline (AFRIN) 0.05 % nasal spray Place 1 spray into both nostrils 2 (two) times daily as needed (for nose bleeds).    Marland Kitchen PARoxetine (PAXIL) 10 MG tablet Take 10 mg by mouth daily.    . potassium chloride (K-DUR,KLOR-CON) 10 MEQ tablet Take 10 mEq by mouth daily.    . Probiotic Product (PROBIOTIC FORMULA) CAPS Take 1 capsule by mouth daily.    . ranitidine (ZANTAC) 150 MG tablet Take 150 mg by mouth every other day. Alternates with nexium    . rivaroxaban (XARELTO) 20 MG TABS tablet Take 1 tablet (20 mg total) by mouth daily with supper. 90 tablet 3  . sodium chloride (V-R NASAL SPRAY SALINE) 0.65 % nasal spray Place 1 spray into both nostrils 5 (five) times daily. As needed for nose bleeds    . traMADol (ULTRAM) 50 MG tablet Take 50 mg by mouth daily as needed.   0  . psyllium (REGULOID) 0.52 g capsule Take 0.52 g by mouth daily.     No current facility-administered medications for this encounter.    Allergies  Allergen Reactions  . Aspirin Anaphylaxis and Swelling    Throat  . Other Anaphylaxis and Swelling    Walnuts, throat   . Strawberry Extract Anaphylaxis  . Codeine Other (See Comments)    hallucinations  . Enalapril Other (See Comments)    Cough, low blood pressure  . Penicillin G Other (See Comments)    Does not help symptoms  . Shellfish-Derived Products Other (See Comments)    From allergy testing, IVP dye is fine    Social History   Social History  . Marital Status: Unknown    Spouse Name: N/A  . Number of Children: 2  . Years of  Education: N/A   Occupational History  . Not on file.   Social History Main Topics  . Smoking status: Never Smoker   . Smokeless tobacco: Not on file  . Alcohol Use: No  . Drug Use: No  . Sexual Activity: Not on file   Other Topics Concern  . Not on file   Social History Narrative   Lives alone.  Husband died 10-Mar-2016    Family History  Problem Relation Age of Onset  . Hypertension Mother   .  Heart disease Father     CHF  . Heart disease Brother 46  . Stroke Maternal Grandmother   . Cancer Maternal Grandfather   . Leukemia Brother   . Heart disease Brother 64  . COPD Sister   . Parkinson's disease Sister   . Hypertension Child   . Diabetes Child     ROS- All systems are reviewed and negative except as per the HPI above  Physical Exam: Filed Vitals:   05/04/16 1057  BP: 118/62  Pulse: 59  Height: 5\' 7"  (1.702 m)  Weight: 169 lb (76.658 kg)    GEN- The patient is well appearing, alert and oriented x 3 today.   Head- normocephalic, atraumatic Eyes-  Sclera clear, conjunctiva pink Ears- hearing intact Oropharynx- clear Neck- supple, no JVP Lymph- no cervical lymphadenopathy Lungs- Clear to ausculation bilaterally, normal work of breathing Heart- regular rate and rhythm, no murmurs, rubs or gallops, PMI not laterally displaced GI- soft, NT, ND, + BS Extremities- no clubbing, cyanosis, or edema MS- no significant deformity or atrophy Skin- no rash or lesion Psych- euthymic mood, full affect Neuro- strength and sensation are intact  EKG- NSR at 66 bpm, pr int 178 ms, qrs int 74 ms, qtc 459 ms Epic records reviewed Cardiac tests, echo, stress test, recently done in [place], reviewed  Assessment and Plan: 1. Persistent afib with failed cardioversion 4/6 Now in SR on amiodarone 200 mg qd, if continues SR, consideration to reduce dose to 100 mg a day Cmet today shows normal liver function, but with creat cl calculated at 47.7 and with frequent nosebleeds will reduce  xarelto dose to 15 mg daily. TSH normal.  F/u with Dr. Percival Spanish in 3 months afib clinic as needed    Geroge Baseman. Kenzo Ozment, [facility] [address] [place], [place] [address] [phone]

## 2016-06-06 ENCOUNTER — Emergency Department (HOSPITAL_BASED_OUTPATIENT_CLINIC_OR_DEPARTMENT_OTHER)
Admission: EM | Admit: 2016-06-06 | Discharge: 2016-06-06 | Disposition: A | Discharge status: Home / Self Care | Payer: Medicare Other | Attending: Emergency Medicine | Admitting: Emergency Medicine

## 2016-06-06 ENCOUNTER — Encounter (HOSPITAL_BASED_OUTPATIENT_CLINIC_OR_DEPARTMENT_OTHER): Payer: Self-pay | Admitting: Emergency Medicine

## 2016-06-06 DIAGNOSIS — Z79899 Other long term (current) drug therapy: Secondary | ICD-10-CM | POA: Insufficient documentation

## 2016-06-06 DIAGNOSIS — I10 Essential (primary) hypertension: Secondary | ICD-10-CM | POA: Diagnosis present

## 2016-06-06 DIAGNOSIS — E785 Hyperlipidemia, unspecified: Secondary | ICD-10-CM | POA: Diagnosis not present

## 2016-06-06 DIAGNOSIS — R197 Diarrhea, unspecified: Secondary | ICD-10-CM | POA: Diagnosis not present

## 2016-06-06 DIAGNOSIS — I4891 Unspecified atrial fibrillation: Secondary | ICD-10-CM | POA: Insufficient documentation

## 2016-06-06 DIAGNOSIS — R42 Dizziness and giddiness: Secondary | ICD-10-CM | POA: Diagnosis not present

## 2016-06-06 DIAGNOSIS — Z8673 Personal history of transient ischemic attack (TIA), and cerebral infarction without residual deficits: Secondary | ICD-10-CM | POA: Insufficient documentation

## 2016-06-06 LAB — BASIC METABOLIC PANEL
ANION GAP: 7 (ref 5–15)
BUN: 8 mg/dL (ref 6–20)
CALCIUM: 8.9 mg/dL (ref 8.9–10.3)
CO2: 27 mmol/L (ref 22–32)
CREATININE: 0.73 mg/dL (ref 0.44–1.00)
Chloride: 97 mmol/L — ABNORMAL LOW (ref 101–111)
GLUCOSE: 147 mg/dL — AB (ref 65–99)
Potassium: 3.6 mmol/L (ref 3.5–5.1)
Sodium: 131 mmol/L — ABNORMAL LOW (ref 135–145)

## 2016-06-06 LAB — URINALYSIS, ROUTINE W REFLEX MICROSCOPIC
BILIRUBIN URINE: NEGATIVE
Glucose, UA: NEGATIVE mg/dL
KETONES UR: NEGATIVE mg/dL
NITRITE: NEGATIVE
PH: 7.5 (ref 5.0–8.0)
Protein, ur: NEGATIVE mg/dL
SPECIFIC GRAVITY, URINE: 1.011 (ref 1.005–1.030)

## 2016-06-06 LAB — CBC WITH DIFFERENTIAL/PLATELET
BASOS ABS: 0 10*3/uL (ref 0.0–0.1)
BASOS PCT: 0 %
EOS PCT: 1 %
Eosinophils Absolute: 0.1 10*3/uL (ref 0.0–0.7)
HCT: 38 % (ref 36.0–46.0)
Hemoglobin: 12.7 g/dL (ref 12.0–15.0)
Lymphocytes Relative: 15 %
Lymphs Abs: 1.2 10*3/uL (ref 0.7–4.0)
MCH: 31.7 pg (ref 26.0–34.0)
MCHC: 33.4 g/dL (ref 30.0–36.0)
MCV: 94.8 fL (ref 78.0–100.0)
MONO ABS: 1.2 10*3/uL — AB (ref 0.1–1.0)
MONOS PCT: 15 %
Neutro Abs: 5.5 10*3/uL (ref 1.7–7.7)
Neutrophils Relative %: 69 %
PLATELETS: 202 10*3/uL (ref 150–400)
RBC: 4.01 MIL/uL (ref 3.87–5.11)
RDW: 14.5 % (ref 11.5–15.5)
WBC: 8 10*3/uL (ref 4.0–10.5)

## 2016-06-06 LAB — URINE MICROSCOPIC-ADD ON

## 2016-06-06 LAB — MAGNESIUM: Magnesium: 1.7 mg/dL (ref 1.7–2.4)

## 2016-06-06 MED ORDER — SODIUM CHLORIDE 0.9 % IV BOLUS (SEPSIS)
500.0000 mL | Freq: Once | INTRAVENOUS | Status: AC
  Administered 2016-06-06: 500 mL via INTRAVENOUS

## 2016-06-06 MED ORDER — AMLODIPINE BESYLATE 5 MG PO TABS: 5.0000 mg | ORAL_TABLET | Freq: Every day | ORAL | Status: DC

## 2016-06-06 NOTE — ED Provider Notes (Signed)
CSN: [id number]     Arrival date & time 06/06/16  [id number] History   First MD Initiated Contact with Patient 06/06/16 1000     Chief Complaint  Patient presents with  . Hypertension     (Consider location/radiation/quality/duration/timing/severity/associated sxs/prior Treatment) HPI   Checked blood pressure and it was 190s this morning prior to taking medication Got up to go to the bathroom and felt like going to pass out, lightheadedness.  No vertigo.  No numbness/weakness on one side/no difficulty talking.  Feeling lightheaded but otherwise walking is unchanged.  No new problems coordinating, notes shakiness when writing for 2 months.  No chest pain or shortness of breath.  No headache now.  Did have it this morning when BP was high, was mild, improved with tylenol, and after taking medicine it was better.   BP meds have increased, however feel BP have not come down.   Dr. Percival Spanish is cardiologist  Loose stools for a while, undergoing IBS evaluation through PCP/GI   Past Medical History  Diagnosis Date  . Hypertension   . Hyperlipidemia   . Stroke ([place])   . GERD (gastroesophageal reflux disease)   . Cancer ([place]) skin  . Atrial fibrillation [facility])    Past Surgical History  Procedure Laterality Date  . Tonsillectomy  1953  . Total abdominal hysterectomy  1983  . Colonoscopy  2009  . Cataract extraction  2015  . Cardioversion N/A 03/04/2016    Procedure: CARDIOVERSION;  Surgeon: Sueanne Margarita, MD;  [facility]: [facility];  Service: Cardiovascular;  Laterality: N/A;   Family History  Problem Relation Age of Onset  . Hypertension Mother   . Heart disease Father     CHF  . Heart disease Brother 20  . Stroke Maternal Grandmother   . Cancer Maternal Grandfather   . Leukemia Brother   . Heart disease Brother 11  . COPD Sister   . Parkinson's disease Sister   . Hypertension Child   . Diabetes Child    Social History  Substance Use Topics  . Smoking status: Never Smoker   .  Smokeless tobacco: None  . Alcohol Use: No   OB History    No data available     Review of Systems  Constitutional: Positive for appetite change. Negative for fever.  HENT: Negative for sore throat.   Eyes: Negative for visual disturbance.  Respiratory: Negative for cough and shortness of breath.   Cardiovascular: Negative for chest pain.  Gastrointestinal: Positive for diarrhea (loose stool for months). Negative for nausea, vomiting (vomited twice in last week), abdominal pain and constipation.  Genitourinary: Negative for dysuria and difficulty urinating.  Musculoskeletal: Negative for back pain and neck pain.  Skin: Negative for rash.  Neurological: Positive for light-headedness and headaches (not having any more). Negative for dizziness, syncope, facial asymmetry, weakness and numbness.      Allergies  Aspirin; Other; Strawberry extract; Codeine; Enalapril; Penicillin g; and Shellfish-derived products  Home Medications   Prior to Admission medications   Medication Sig Start Date End Date Taking? Authorizing Provider  acetaminophen (TYLENOL) 500 MG tablet Take 500 mg by mouth 2 (two) times daily as needed for mild pain.    Yes Historical Provider, MD  amiodarone (PACERONE) 200 MG tablet Take 1 tablet (200 mg total) by mouth daily. 04/15/16  Yes Sherran Needs, NP  atenolol (TENORMIN) 50 MG tablet Take 1 tablet (50 mg total) by mouth 2 (two) times daily. 03/08/16  Yes Sherran Needs, NP  atorvastatin (LIPITOR) 20 MG tablet Take 1 tablet by mouth daily. 06/26/15  Yes Historical Provider, MD  cetirizine (ZYRTEC) 10 MG tablet Take 10 mg by mouth daily.   Yes Historical Provider, MD  Cholecalciferol (VITAMIN D3 SUPER STRENGTH) 2000 units TABS Take 1 tablet by mouth daily.   Yes Historical Provider, MD  Cranberry 500 MG CAPS Take 500 mg by mouth daily.   Yes Historical Provider, MD  diazepam (VALIUM) 5 MG tablet Take 1 tablet by mouth as needed for anxiety.  12/03/15  Yes Historical  Provider, MD  diltiazem (CARDIZEM) 120 MG tablet Take 1 tablet (120 mg total) by mouth daily. 04/05/16  Yes Sherran Needs, NP  esomeprazole (NEXIUM) 20 MG capsule Take 20 mg by mouth every other day.    Yes Historical Provider, MD  estradiol (ESTRACE) 0.1 MG/GM vaginal cream Place 1 Applicatorful vaginally 2 (two) times a week. Wed and Sun   Yes Historical Provider, MD  Melatonin 5 MG CAPS Take 5 mg by mouth at bedtime.   Yes Historical Provider, MD  Multiple Vitamins-Minerals (CENTRUM PO) Take 1 tablet by mouth daily. Not taking on Wed and Sun   Yes Historical Provider, MD  mupirocin ointment (BACTROBAN) 2 % 1 application 2 (two) times daily. 02/18/16  Yes Historical Provider, MD  oxymetazoline (AFRIN) 0.05 % nasal spray Place 1 spray into both nostrils 2 (two) times daily as needed (for nose bleeds).   Yes Historical Provider, MD  PARoxetine (PAXIL) 10 MG tablet Take 10 mg by mouth daily.   Yes Historical Provider, MD  ranitidine (ZANTAC) 150 MG tablet Take 150 mg by mouth every other day. Alternates with nexium   Yes Historical Provider, MD  rivaroxaban (XARELTO) 15 MG TABS tablet Take 1 tablet (15 mg total) by mouth daily with supper. 05/04/16  Yes Sherran Needs, NP  furosemide (LASIX) 20 MG tablet Take 20 mg by mouth daily.    Historical Provider, MD  potassium chloride (K-DUR,KLOR-CON) 10 MEQ tablet Take 10 mEq by mouth daily.    Historical Provider, MD  Probiotic Product (PROBIOTIC FORMULA) CAPS Take 1 capsule by mouth daily.    Historical Provider, MD  psyllium (REGULOID) 0.52 g capsule Take 0.52 g by mouth daily. 02/22/16   Historical Provider, MD  sodium chloride (V-R NASAL SPRAY SALINE) 0.65 % nasal spray Place 1 spray into both nostrils 5 (five) times daily. As needed for nose bleeds    Historical Provider, MD  traMADol (ULTRAM) 50 MG tablet Take 50 mg by mouth daily as needed.  12/15/15   Historical Provider, MD   BP 168/70 mmHg  Pulse 58  Temp(Src) 98.4 F (36.9 C) (Oral)  Resp 18   Ht 5\' 7"  (1.702 m)  Wt 170 lb (77.111 kg)  BMI 26.62 kg/m2  SpO2 94% Physical Exam  Constitutional: She is oriented to person, place, and time. She appears well-developed and well-nourished. No distress.  HENT:  Head: Normocephalic and atraumatic.  Eyes: Conjunctivae and EOM are normal. Pupils are equal, round, and reactive to light. Right eye exhibits normal extraocular motion and no nystagmus. Left eye exhibits normal extraocular motion and no nystagmus. Right pupil is round and reactive. Left pupil is round and reactive.  Normal peripheral vision  Neck: Normal range of motion.  Cardiovascular: Normal rate, regular rhythm, normal heart sounds and intact distal pulses.  Exam reveals no gallop and no friction rub.   No murmur heard. Pulmonary/Chest: Effort normal and breath sounds normal. No respiratory distress. She  has no wheezes. She has no rales.  Abdominal: Soft. She exhibits no distension. There is no tenderness. There is no guarding.  Musculoskeletal: She exhibits no edema or tenderness.  Neurological: She is alert and oriented to person, place, and time. She has normal strength. She displays tremor (baseline). No cranial nerve deficit or sensory deficit. Coordination and gait normal. GCS eye subscore is 4. GCS verbal subscore is 5. GCS motor subscore is 6.  Pt feeling lightheaded from standing and difficulty with Romberg  Skin: Skin is warm and dry. No rash noted. She is not diaphoretic. No erythema.  Nursing note and vitals reviewed.   ED Course  Procedures (including critical care time) Labs Review Labs Reviewed - No data to display  Imaging Review No results found. I have personally reviewed and evaluated these images and lab results as part of my medical decision-making.   EKG Interpretation None      MDM   Final diagnoses:  None   82 year old female with a history of hypertension, hyperlipidemia, TIA, atrial fibrillation, cancer, GERD presents with concern for  elevated blood pressures. Daughter reports that patient was started on DDAVP, 10 days after that began to have blood pressures that were difficult to control, and she's had her atenolol prescription increased twice, with continuing blood pressures to [id number] systolic today prior to taking her medications. Also reports that she's had lightheadedness and had a very mild headache this morning.  On arrival to the ED, BP was 168/70.  Patient with brief very mild headache, no neurologic symptoms, no chest pain, no shortness of breath and have low suspicion for hypertensive emergencies including low suspicion for [facility], hypertensive encephalopathy, stroke, MI, aortic dissection, pulmonary edema.  Feel lightheadedness unlikely to be CVA in setting of no new balance issues, no ataxia on exam.  Given lightheadedness, ECG was performed which showed no acute abnormaliteis. CBC showed hgb WNL  BMP was checked showing normal renal function, sodium levels down to 131 likely from DDAVP. Urinalysis shows likely contamination, doubt UTI.  Lightheadedness may be secondary to relative low HR (50s) in setting of increased atenolol, or more likely decreased po intake and loose stool. Given 500cc NS. Blood pressures 150s in ED, and do not feel she requires other BP management at this time, however given concerns of higher values at home will add 5mg  amlodipine. Discussed importance of close primary care follow up and reasons to return to the ED in detail. Patient discharged in stable condition with understanding of reasons to return.    Gareth Morgan, MD 06/06/16 2112

## 2016-06-06 NOTE — ED Notes (Signed)
Pt reports she was started on DDAVP by her urologist and noticed her BP steadily going up. Pt reports BP at home this morning was 178/82. Pt was seen by PCP Friday and had changes made to BP meds. Pt reports HA and dizziness. Pt also reports ongoing diarrhea and abd pain, started on Bentyl on Friday. Pt denies chest pain, SOB

## 2016-06-06 NOTE — ED Notes (Signed)
Discharge instructions review with patient and family member at bedside.  IV fluids continue to infuse without complication.  Approx 240mL left to infuse before NS fluid bolus is complete.

## 2016-06-08 LAB — URINE CULTURE

## 2016-06-11 ENCOUNTER — Telehealth: Payer: Self-pay | Admitting: Pharmacist Clinician (PhC)/ Clinical Pharmacy Specialist

## 2016-06-11 ENCOUNTER — Telehealth: Payer: Self-pay | Admitting: *Deleted

## 2016-06-11 NOTE — Telephone Encounter (Signed)
[facility] requesting clearance for Sarah Armstrong to hold Xarelto for 2 days prior to endoscopy (not yet scheduled).  Ok to hold Xarelto for 2 days, restart day of or day after at discretion of GI MD

## 2016-06-11 NOTE — Telephone Encounter (Signed)
   Rockne Menghini, RPH-CPP at 06/11/2016 11:47 AM    Status: Signed      Expand All Collapse All    [facility] requesting clearance for Jerlean to hold Xarelto for 2 days prior to endoscopy (not yet scheduled). Ok to hold Xarelto for 2 days, restart day of or day after at discretion of GI MD

## 2016-06-11 NOTE — Telephone Encounter (Signed)
Clearance form faxed to [facility] via faxed machine

## 2016-06-11 NOTE — Telephone Encounter (Signed)
Requesting surgical clearance:   1. Type of surgery: EGD  2. Surgeon: Dr Dolphus Jenny  3. Surgical date: Pending  4. Medications that need to be help: Xarelto  5. [facility]: (P) [phone] (F) [phone]  You saw her last on 03/17, Is pt cleared for surgery, If so how long can Xarelto be held?

## 2016-06-11 NOTE — Telephone Encounter (Signed)
Clearance form faxed to [facility] via faxed.Marland Kitchen

## 2016-07-30 ENCOUNTER — Encounter: Payer: Self-pay | Admitting: Cardiology

## 2016-08-15 NOTE — Progress Notes (Signed)
I have personally oh     Cardiology Office Note   Date:  08/16/2016   ID:  Sarah Armstrong, DOB 10/30/1931, MRN [id number]  PCP:  Pcp Not In System  Cardiologist:   Minus Breeding, MD   Chief Complaint  Patient presents with  . Atrial Fibrillation     History of Present Illness: Sarah Armstrong is a 82 y.o. female who presents for evaluation of atrial fibrillation. She was found to have this in January. She had been caring for her husband who had chronic health issues and who subsequently died in 2023-01-12. At the noticing palpitations for a few weeks before that. She eventually was noted to have atrial fibrillation. She was seen by a cardiologist in [place]. Echocardiography demonstrated normal ejection fraction with some mild mitral and tricuspid regurgitation. She also had some mild carotid stenosis on Doppler. Cardizem was added to her medications and she was started on Xarelto. She spontaneously converted to NSR.   She was in the emergency room once with hypertension was actually started on Norvasc but had swelling with this. She's been feeling well with fewer palpitations now and much better than she wants. The patient denies any new symptoms such as chest discomfort, neck or arm discomfort. There has been no new shortness of breath, PND or orthopnea. There have been no reported palpitations, presyncope or syncope.   Past Medical History:  Diagnosis Date  . Atrial fibrillation ([place])   . Cancer ([place]) skin  . GERD (gastroesophageal reflux disease)   . Hyperlipidemia   . Hypertension   . Stroke [facility])     Past Surgical History:  Procedure Laterality Date  . CARDIOVERSION N/A 03/04/2016   Procedure: CARDIOVERSION;  Surgeon: Sueanne Margarita, MD;  Location: [place];  Service: Cardiovascular;  Laterality: N/A;  . CATARACT EXTRACTION  2015  . COLONOSCOPY  2009  . TONSILLECTOMY  1953  . TOTAL ABDOMINAL HYSTERECTOMY  1983     Current Outpatient Prescriptions  Medication Sig Dispense Refill   . acetaminophen (TYLENOL) 500 MG tablet Take 500 mg by mouth 2 (two) times daily as needed for mild pain.     Marland Kitchen amiodarone (PACERONE) 200 MG tablet Take 1 tablet (200 mg total) by mouth daily. 90 tablet 3  . atorvastatin (LIPITOR) 20 MG tablet Take 1 tablet by mouth daily.    . cetirizine (ZYRTEC) 10 MG tablet Take 10 mg by mouth daily.    . Cholecalciferol (VITAMIN D3 SUPER STRENGTH) 2000 units TABS Take 1 tablet by mouth daily.    . Cranberry 500 MG CAPS Take 500 mg by mouth daily.    . diazepam (VALIUM) 5 MG tablet Take 1 tablet by mouth as needed for anxiety.     Marland Kitchen diltiazem (CARDIZEM) 120 MG tablet Take 1 tablet (120 mg total) by mouth daily. 90 tablet 3  . estradiol (ESTRACE) 0.1 MG/GM vaginal cream Place 1 Applicatorful vaginally 2 (two) times a week. Wed and Sun    . Multiple Vitamins-Minerals (CENTRUM PO) Take 1 tablet by mouth daily. Not taking on Wed and Sun    . oxymetazoline (AFRIN) 0.05 % nasal spray Place 1 spray into both nostrils 2 (two) times daily as needed (for nose bleeds).    . Probiotic Product (PROBIOTIC FORMULA) CAPS Take 1 capsule by mouth as needed.     . ranitidine (ZANTAC) 150 MG tablet Take 150 mg by mouth daily.     . rivaroxaban (XARELTO) 15 MG TABS tablet Take 1 tablet (15 mg total)  by mouth daily with supper. 90 tablet 3  . sodium chloride (V-R NASAL SPRAY SALINE) 0.65 % nasal spray Place 1 spray into both nostrils 5 (five) times daily. As needed for nose bleeds    . traMADol (ULTRAM) 50 MG tablet Take 50 mg by mouth daily as needed.   0  . atenolol (TENORMIN) 50 MG tablet Take 1 tablet (50 mg total) by mouth 2 (two) times daily. 60 tablet 0   No current facility-administered medications for this visit.     Allergies:   Aspirin; Other; Strawberry extract; Codeine; Enalapril; Penicillin g; and Shellfish-derived products    ROS:  Please see the history of present illness.   Otherwise, review of systems are positive for frequent urination.   All other systems  are reviewed and negative.    PHYSICAL EXAM: VS:  BP 134/60   Pulse (!) 53   Ht 5' 7.5" (1.715 m)   Wt 164 lb 3.2 oz (74.5 kg)   BMI 25.34 kg/m  , BMI Body mass index is 25.34 kg/m. GENERAL:  Well appearing NECK:  No jugular venous distention, waveform within normal limits, carotid upstroke brisk and symmetric, no bruits, no thyromegaly LUNGS:  Clear to auscultation bilaterally BACK:  No CVA tenderness CHEST:  Unremarkable HEART:  PMI not displaced or sustained,S1 and S2 within normal limits, no S3, no clicks, no rubs, no murmurs, irregular ABD:  Flat, positive bowel sounds normal in frequency in pitch, no bruits, no rebound, no guarding, no midline pulsatile mass, no hepatomegaly, no splenomegaly EXT:  2 plus pulses throughout, no edema, no cyanosis no clubbing   EKG:  EKG is not  ordered today. The ekg ordered 7/9/17demonstrates atrial fibrillation, rate 56, axis within normal limits, intervals within normal limits, borderline low voltage in limb leads, no acute ST-T wave changes.   Recent Labs: 02/13/2016: Brain Natriuretic Peptide 120.5 05/04/2016: ALT 19; TSH 2.227 06/06/2016: BUN 8; Creatinine, Ser 0.73; Hemoglobin 12.7; Magnesium 1.7; Platelets 202; Potassium 3.6; Sodium 131    Lipid Panel No results found for: CHOL, TRIG, HDL, CHOLHDL, VLDL, LDLCALC, LDLDIRECT    Wt Readings from Last 3 Encounters:  08/16/16 164 lb 3.2 oz (74.5 kg)  06/06/16 170 lb (77.1 kg)  05/04/16 169 lb (76.7 kg)      Other studies Reviewed: Additional studies/ records that were reviewed today include:  ED records Review of the above records demonstrates:  Please see elsewhere in the note.     ASSESSMENT AND PLAN:  ATRIAL FIB:  Ms. Sarah Armstrong has a CHA2DS2 - VASc score of 4 with a risk of stroke of 4%.   She seems to be maintaining NSR.  I will continue with her meds as listed.  She is up to date with labs and I gave instructions to her daughter who is a nurse to get CBC, CMET and TSH twice  yearly.  She can reduce her atenolol to 50 mg bid  HTN:  Her BP was elevated in the ED.  However, it is better controlled.  Norvasc was started but she was already on a beta blocker.  This has been stopped.    MR/TR:  I wiil repeat echocardiography in the future.   Current medicines are reviewed at length with the patient today.  The patient does not have concerns regarding medicines.  The following changes have been made:  no change  Labs/ tests ordered today include:   No orders of the defined types were placed in this encounter.  Disposition:   FU with me at the time of DCCV.     Signed, Minus Breeding, MD  08/16/2016 12:58 PM    [facility]

## 2016-08-16 ENCOUNTER — Encounter: Payer: Self-pay | Admitting: Cardiology

## 2016-08-16 ENCOUNTER — Ambulatory Visit (INDEPENDENT_AMBULATORY_CARE_PROVIDER_SITE_OTHER): Payer: Medicare Other | Admitting: Cardiology

## 2016-08-16 VITALS — BP 134/60 | HR 53 | Ht 67.5 in | Wt 164.2 lb

## 2016-08-16 DIAGNOSIS — I481 Persistent atrial fibrillation: Secondary | ICD-10-CM

## 2016-08-16 DIAGNOSIS — I4819 Other persistent atrial fibrillation: Secondary | ICD-10-CM

## 2016-08-16 MED ORDER — ATENOLOL 50 MG PO TABS: 50.0000 mg | ORAL_TABLET | Freq: Two times a day (BID) | ORAL | 0 refills | Status: DC

## 2016-08-16 MED ORDER — ATENOLOL 50 MG PO TABS: 50.0000 mg | ORAL_TABLET | Freq: Two times a day (BID) | ORAL | 3 refills | Status: DC

## 2016-08-16 NOTE — Patient Instructions (Signed)

## 2016-08-17 ENCOUNTER — Telehealth: Payer: Self-pay | Admitting: Cardiology

## 2016-08-17 NOTE — Telephone Encounter (Signed)
New Message  Pt c/o medication issue:  1. Name of Medication: Atenlol  2. How are you currently taking this medication (dosage and times per day)? 50mg   3. Are you having a reaction (difficulty breathing--STAT)? Per pt daughter pt woke up looking flushed in the face and a headache.  4. What is your medication issue? Pt daughter call requesting to speak with RN. Pt daughter states pts med dosage was changed and not I causing pt to react. Please call back to discuss

## 2016-08-17 NOTE — Telephone Encounter (Signed)
Spoke to [place]. She notes patient had a single elevated BP reading - 0000000 systolic around 4am this morning. Notes atenolol dosage change yesterday from 100mg  bid to 50mg  bid.  Advised no urgent action needed - would track BPs for a week or so and get a trend rather than going off 1 reading. Also recommended making sure patient checks at rest and is doing so at a consistent time each day. We discussed that the BP can fluctuate throughout the day. Daughter voiced understanding, will have pt check regularly and call back if concerns.

## 2016-12-02 ENCOUNTER — Telehealth: Payer: Self-pay | Admitting: Cardiology

## 2016-12-02 NOTE — Progress Notes (Signed)
I have personally oh     Cardiology Office Note   Date:  12/05/2016   ID:  [name], DOB [birth year], MRN [id number]  PCP:  Pcp Not In System  Cardiologist:   Minus Breeding, MD   Chief Complaint  Patient presents with  . Atrial Fibrillation     History of Present Illness: Sarah Armstrong is a 83 y.o. female who presents for evaluation of HTN.  I have seen her for atrial fibrillation. She was found to have this in January of last year. She had been caring for her husband who had chronic health issues and who subsequently died in 02-17-2023. Since she was last seen she's been having problems with frequent urination. She is up to go to bathroom and flushed. She might get a headache. States blood pressure blood pressures been elevated in the 190s. She's had her meds adjusted to most recently include the addition of clonidine. She only took this for a few days and it caused some low heart rate. She feels somewhat fatigued. She denies any presyncope or syncope. She's had no chest pressure, neck or arm discomfort. She's had no weight gain or edema.  Past Medical History:  Diagnosis Date  . Atrial fibrillation ([place])   . Cancer ([place]) skin  . GERD (gastroesophageal reflux disease)   . Hyperlipidemia   . Hypertension   . Stroke [facility])     Past Surgical History:  Procedure Laterality Date  . CARDIOVERSION N/A 03/04/2016   Procedure: CARDIOVERSION;  Surgeon: Sueanne Margarita, MD;  Location: [place];  Service: Cardiovascular;  Laterality: N/A;  . CATARACT EXTRACTION  2015  . COLONOSCOPY  2009  . TONSILLECTOMY  1953  . TOTAL ABDOMINAL HYSTERECTOMY  1983     Current Outpatient Prescriptions  Medication Sig Dispense Refill  . acetaminophen (TYLENOL) 500 MG tablet Take 500 mg by mouth 2 (two) times daily as needed for mild pain.     Marland Kitchen amiodarone (PACERONE) 200 MG tablet Take 1 tablet (200 mg total) by mouth daily. 90 tablet 3  . atorvastatin (LIPITOR) 20 MG tablet Take 1 tablet by mouth daily.      . cetirizine (ZYRTEC) 10 MG tablet Take 10 mg by mouth daily.    . Cholecalciferol (VITAMIN D3 SUPER STRENGTH) 2000 units TABS Take 1 tablet by mouth daily.    . Cranberry 500 MG CAPS Take 500 mg by mouth daily.    . diazepam (VALIUM) 5 MG tablet Take 1 tablet by mouth as needed for anxiety.     Marland Kitchen diltiazem (CARDIZEM) 120 MG tablet Take 1 tablet (120 mg total) by mouth daily. 90 tablet 3  . estradiol (ESTRACE) 0.1 MG/GM vaginal cream Place 1 Applicatorful vaginally 2 (two) times a week. Wed and Sun    . losartan (COZAAR) 50 MG tablet Take 50 mg by mouth daily. Also takes 25mg  PRN at night if BP elevated.    . metoprolol (LOPRESSOR) 100 MG tablet Take 100 mg by mouth 2 (two) times daily.    . Multiple Vitamins-Minerals (CENTRUM PO) Take 1 tablet by mouth daily. Not taking on Wed and Sun    . oxymetazoline (AFRIN) 0.05 % nasal spray Place 1 spray into both nostrils 2 (two) times daily as needed (for nose bleeds).    . Probiotic Product (PROBIOTIC FORMULA) CAPS Take 1 capsule by mouth as needed.     . ranitidine (ZANTAC) 150 MG tablet Take 150 mg by mouth daily.     . rivaroxaban (XARELTO) 15  MG TABS tablet Take 1 tablet (15 mg total) by mouth daily with supper. 90 tablet 3  . traMADol (ULTRAM) 50 MG tablet Take 50 mg by mouth daily as needed.   0  . hydrALAZINE (APRESOLINE) 10 MG tablet Take 1 tablet (10 mg total) by mouth 3 (three) times daily. and as Needed 45 tablet 9   No current facility-administered medications for this visit.     Allergies:   Aspirin; Other; Strawberry extract; Codeine; Enalapril; Melatonin; Penicillin g; Shellfish-derived products; and Latex    ROS:  Please see the history of present illness.   Otherwise, review of systems are positive for none   All other systems are reviewed and negative.    PHYSICAL EXAM: VS:  BP (!) 168/74 (BP Location: Right Arm, Patient Position: Sitting, Cuff Size: Normal)   Pulse (!) 54   Ht 5\' 7"  (1.702 m)   Wt 170 lb 6 oz (77.3 kg)    BMI 26.68 kg/m  , BMI Body mass index is 26.68 kg/m. GENERAL:  Well appearing NECK:  No jugular venous distention, waveform within normal limits, carotid upstroke brisk and symmetric, no bruits, no thyromegaly LUNGS:  Clear to auscultation bilaterally BACK:  No CVA tenderness CHEST:  Unremarkable HEART:  PMI not displaced or sustained,S1 and S2 within normal limits, no S3, no clicks, no rubs, no murmurs, irregular ABD:  Flat, positive bowel sounds normal in frequency in pitch, no bruits, no rebound, no guarding, no midline pulsatile mass, no hepatomegaly, no splenomegaly EXT:  2 plus pulses throughout, trace edema, no cyanosis no clubbing   EKG:  EKG is not  ordered today.   Recent Labs: 02/13/2016: Brain Natriuretic Peptide 120.5 05/04/2016: ALT 19; TSH 2.227 06/06/2016: BUN 8; Creatinine, Ser 0.73; Hemoglobin 12.7; Magnesium 1.7; Platelets 202; Potassium 3.6; Sodium 131    Lipid Panel No results found for: CHOL, TRIG, HDL, CHOLHDL, VLDL, LDLCALC, LDLDIRECT    Wt Readings from Last 3 Encounters:  12/03/16 170 lb 6 oz (77.3 kg)  08/16/16 164 lb 3.2 oz (74.5 kg)  06/06/16 170 lb (77.1 kg)      Other studies Reviewed: Additional studies/ records that were reviewed today include:  None Review of the above records demonstrates:      ASSESSMENT AND PLAN:  ATRIAL FIB:  Ms. Sarah Armstrong has a CHA2DS2 - VASc score of 4 with a risk of stroke of 4%.    She seems to be maintaining NSR.  I will continue with her meds as listed.  She is up to date with labs and I gave instructions to her daughter who is a nurse to get CBC, CMET and TSH twice yearly.   HTN:  Her blood pressure is not well controlled. I'm going to stop the clonidine. She's any use hydralazine 10 mg 3 times a day and then when necessary in the middle of the night.  She's and let us know if this is helping with her blood pressure and if not we'll probably increase the dose of hydralazine.  MR/TR:  I wiil repeat echocardiography  in the future.   Current medicines are reviewed at length with the patient today.  The patient does not have concerns regarding medicines.  The following changes have been made:  As above.  Labs/ tests ordered today include:    No orders of the defined types were placed in this encounter.    Disposition:   FU with me 3 months.    Signed, Minus Breeding, MD  12/05/2016  9:06 PM    [facility]

## 2016-12-02 NOTE — Telephone Encounter (Signed)
Spoke to daughter, [place].  She outlines concerns for patient having continued BP elevations, especially at night, where they seem to hit around 200/90. Apparently this has been followed for a couple of weeks already.  Also notes that the patient was seen for BP elevation in urgent care several nights ago and was seen by her PCP the following day. Was started on losartan, initially at 25mg  daily to be taken after lunch, then was increased to 50mg , w option for PRN 25mg  at night. Adds that the pt was just started on clonidine 0.1mg  TID Pt not having any symptoms at present. She gets headaches at night.  She wants to have patient see Dr. Percival Spanish to further address and has made appt. i've encouraged them to keep the follow up appt as scheduled. Med list is updated.  She voiced understanding and thanks.

## 2016-12-02 NOTE — Telephone Encounter (Signed)
New Message   Pt c/o BP issue: STAT if pt c/o blurred vision, one-sided weakness or slurred speech  1. What are your last 5 BP readings? 8AM 178/78, 2AM 207/97, 9PM 134/60, 630PM 181/88  2. Are you having any other symptoms (ex. Dizziness, headache, blurred vision, passed out)? Headache  3. What is your BP issue? BP going up and down  Patient scheduled appt with Hochrein for tomorrow 12/02/16

## 2016-12-03 ENCOUNTER — Ambulatory Visit (INDEPENDENT_AMBULATORY_CARE_PROVIDER_SITE_OTHER): Payer: Medicare Other | Admitting: Cardiology

## 2016-12-03 ENCOUNTER — Encounter: Payer: Self-pay | Admitting: Cardiology

## 2016-12-03 VITALS — BP 168/74 | HR 54 | Ht 67.0 in | Wt 170.4 lb

## 2016-12-03 DIAGNOSIS — I10 Essential (primary) hypertension: Secondary | ICD-10-CM | POA: Diagnosis not present

## 2016-12-03 DIAGNOSIS — I480 Paroxysmal atrial fibrillation: Secondary | ICD-10-CM

## 2016-12-03 MED ORDER — HYDRALAZINE HCL 10 MG PO TABS: 10.0000 mg | ORAL_TABLET | Freq: Three times a day (TID) | ORAL | 9 refills | Status: DC

## 2016-12-03 NOTE — Patient Instructions (Signed)
Medication Instructions:  START- Hydralazine 10 mg three times a day and as needed  Labwork: None Ordered  Testing/Procedures: None Ordered  Follow-Up: Your physician recommends that you schedule a follow-up appointment in: 3 Months.   Any Other Special Instructions Will Be Listed Below (If Applicable).     If you need a refill on your cardiac medications before your next appointment, please call your pharmacy.

## 2016-12-05 ENCOUNTER — Encounter: Payer: Self-pay | Admitting: Cardiology

## 2016-12-09 ENCOUNTER — Telehealth: Payer: Self-pay | Admitting: Cardiology

## 2016-12-09 MED ORDER — HYDRALAZINE HCL 25 MG PO TABS: 25.0000 mg | ORAL_TABLET | Freq: Three times a day (TID) | ORAL | 6 refills | Status: DC

## 2016-12-09 NOTE — Telephone Encounter (Signed)
Spoke with pt and her daughter letting them know Dr Percival Spanish increase Hydralazine 25 mg three times a day and new rx was send into pt pharmacy.Marland Kitchen

## 2016-12-09 NOTE — Telephone Encounter (Signed)
New message   Daughter calling seen in the office last week.   Pt C/O medication issue:  1. Name of Medication: hydralazine (APRESOLINE) 10 MG tablet  2. How are you currently taking this medication (dosage and times per day)? Three times a day   3. Are you having a reaction (difficulty breathing--STAT)? No   4. What is your medication issue? B/p 180/67, during the night 210/67 or 68 . Blood pressure taken 3 times today

## 2016-12-09 NOTE — Telephone Encounter (Signed)
SPOKE TO DAUGHTER She states medication hydralazine is taken-- 6am, 2pm, 10 pm.  daughter states patient takes blood pressure through out day and night.  daughter only had readinss for 12/08/16  2 am   189/75 3 am  195/73  After taking losartan 50 mg and hydralazine 10 mg 7:30 am 187/74  12 noon 178/70 took losartan 50 mg, losartan 10 mg  Daughter states patient get up 5-6 times night to go to bathroom.   Informed daughter to continue with medication will defer to Dr Percival Spanish to see if medication changes are needed.  verbalized understanding

## 2016-12-09 NOTE — Telephone Encounter (Signed)
Increase hydralazine to 25 mg three times daily.  Call in new prescription for 25 mg tid Disp number 90 with 11 refills.

## 2017-01-05 ENCOUNTER — Other Ambulatory Visit (HOSPITAL_BASED_OUTPATIENT_CLINIC_OR_DEPARTMENT_OTHER): Payer: Self-pay

## 2017-01-05 DIAGNOSIS — G4730 Sleep apnea, unspecified: Secondary | ICD-10-CM

## 2017-01-05 DIAGNOSIS — R5383 Other fatigue: Secondary | ICD-10-CM

## 2017-01-21 ENCOUNTER — Other Ambulatory Visit: Payer: Self-pay | Admitting: Nephrology

## 2017-01-21 DIAGNOSIS — I10 Essential (primary) hypertension: Secondary | ICD-10-CM

## 2017-02-10 ENCOUNTER — Ambulatory Visit
Admission: RE | Admit: 2017-02-10 | Discharge: 2017-02-10 | Disposition: A | Discharge status: Home / Self Care | Payer: Medicare Other | Source: Ambulatory Visit | Attending: Nephrology | Admitting: Nephrology

## 2017-02-10 DIAGNOSIS — I10 Essential (primary) hypertension: Secondary | ICD-10-CM

## 2017-02-17 ENCOUNTER — Ambulatory Visit (HOSPITAL_BASED_OUTPATIENT_CLINIC_OR_DEPARTMENT_OTHER): Payer: Medicare Other

## 2017-02-18 ENCOUNTER — Other Ambulatory Visit: Payer: Self-pay | Admitting: *Deleted

## 2017-02-18 MED ORDER — HYDRALAZINE HCL 25 MG PO TABS: 25.0000 mg | ORAL_TABLET | Freq: Three times a day (TID) | ORAL | 3 refills | Status: DC

## 2017-02-18 NOTE — Telephone Encounter (Signed)
E-SENT RX TO MAIL ORDER WITH  DIRECTION

## 2017-03-07 ENCOUNTER — Ambulatory Visit: Payer: Medicare Other | Admitting: Cardiology

## 2017-03-23 ENCOUNTER — Telehealth: Payer: Self-pay | Admitting: Cardiology

## 2017-03-23 NOTE — Telephone Encounter (Signed)
Received records from [facility] for appointment on 04/04/17 with Dr Percival Spanish.  Records put with Dr Hochrein's schedule for 04/04/17. lp

## 2017-03-30 ENCOUNTER — Other Ambulatory Visit (HOSPITAL_COMMUNITY): Payer: Self-pay | Admitting: *Deleted

## 2017-03-30 MED ORDER — AMIODARONE HCL 200 MG PO TABS: 200.0000 mg | ORAL_TABLET | Freq: Every day | ORAL | 3 refills | Status: DC

## 2017-03-31 ENCOUNTER — Encounter: Payer: Self-pay | Admitting: Cardiology

## 2017-04-03 NOTE — Progress Notes (Signed)
I have personally oh     Cardiology Office Note   Date:  04/04/2017   ID:  Sarah Armstrong, DOB 1931/05/19, MRN [id number]  PCP:  Allie Dimmer, MD  Cardiologist:   Minus Breeding, MD   Chief Complaint  Patient presents with  . Atrial Fibrillation     History of Present Illness: Sarah Armstrong is a 83 y.o. female who presents for evaluation of HTN.  I have seen her for atrial fibrillation. She was found to have this in January of last year. She had been caring for her husband who had chronic health issues and who subsequently died in 02/18/16.  She is being followed by nephrology.  She had a renal ultrasound in March that did not demonstrate renal artery stenosis.  She had negative metanephrines as well.  She did have one night when her SBP was 200.  She took hydralazine. She had to take two because it did not come down at first.  This was unusual.  She typically has systolic BPs in the 027O.  She does not feel well if it is in the 130s.  The patient denies any new symptoms such as chest discomfort, neck or arm discomfort. There has been no new PND or orthopnea. There have been no reported palpitations, presyncope or syncope.  She has to sleep with her head up because of what sounds like sinus problems.  This has been chronic.   Of note the nephrologist tried to change her meds and stop the hydralazine and increase her Cozaar but the patient said that her BP went up and she went back onto this.   Past Medical History:  Diagnosis Date  . Atrial fibrillation ([place])   . Cancer ([place]) skin  . GERD (gastroesophageal reflux disease)   . Hyperlipidemia   . Hypertension   . Stroke [facility])     Past Surgical History:  Procedure Laterality Date  . CARDIOVERSION N/A 03/04/2016   Procedure: CARDIOVERSION;  Surgeon: Sueanne Margarita, MD;  Location: [place];  Service: Cardiovascular;  Laterality: N/A;  . CATARACT EXTRACTION  2015  . COLONOSCOPY  2009  . TONSILLECTOMY  1953  . TOTAL ABDOMINAL  HYSTERECTOMY  1983     Current Outpatient Prescriptions  Medication Sig Dispense Refill  . acetaminophen (TYLENOL) 500 MG tablet Take 500 mg by mouth 2 (two) times daily as needed for mild pain.     Marland Kitchen amiodarone (PACERONE) 200 MG tablet Take 1 tablet (200 mg total) by mouth daily. 90 tablet 3  . atorvastatin (LIPITOR) 20 MG tablet Take 1 tablet by mouth daily.    . cetirizine (ZYRTEC) 10 MG tablet Take 10 mg by mouth daily.    . Cholecalciferol (VITAMIN D3 SUPER STRENGTH) 2000 units TABS Take 1 tablet by mouth daily.    . Cranberry 500 MG CAPS Take 500 mg by mouth daily.    . diazepam (VALIUM) 5 MG tablet Take 1 tablet by mouth as needed for anxiety.     Marland Kitchen diltiazem (CARDIZEM) 120 MG tablet Take 1 tablet (120 mg total) by mouth daily. 90 tablet 3  . estradiol (ESTRACE) 0.1 MG/GM vaginal cream Place 1 Applicatorful vaginally 2 (two) times a week. Wed and Sun    . hydrALAZINE (APRESOLINE) 25 MG tablet Take 1 tablet (25 mg total) by mouth 3 (three) times daily. and as Needed 270 tablet 3  . losartan (COZAAR) 50 MG tablet Take 50 mg by mouth daily.     . metoprolol (LOPRESSOR) 100  MG tablet Take 100 mg by mouth 2 (two) times daily.    . Multiple Vitamins-Minerals (CENTRUM PO) Take 1 tablet by mouth daily. Not taking on Wed and Sun    . oxymetazoline (AFRIN) 0.05 % nasal spray Place 1 spray into both nostrils 2 (two) times daily as needed (for nose bleeds).    . Probiotic Product (PROBIOTIC FORMULA) CAPS Take 1 capsule by mouth as needed.     . ranitidine (ZANTAC) 150 MG tablet Take 150 mg by mouth daily.     . rivaroxaban (XARELTO) 15 MG TABS tablet Take 1 tablet (15 mg total) by mouth daily with supper. 90 tablet 3   No current facility-administered medications for this visit.     Allergies:   Aspirin; Other; Strawberry extract; Codeine; Enalapril; Melatonin; Penicillin g; Shellfish-derived products; and Latex    ROS:  Please see the history of present illness.   Otherwise, review of  systems are positive for none  All other systems are reviewed and negative.    PHYSICAL EXAM: VS:  BP (!) 162/70 (BP Location: Left Arm, Patient Position: Sitting, Cuff Size: Normal)   Pulse (!) 54   Ht 5\' 7"  (1.702 m)   Wt 170 lb (77.1 kg)   BMI 26.63 kg/m  , BMI Body mass index is 26.63 kg/m. GENERAL:  Well appearing, no distress NECK:  No jugular venous distention, waveform within normal limits, carotid upstroke brisk and symmetric, no bruits, no thyromegaly LUNGS:  Clear to auscultation bilaterally BACK:  No CVA tenderness CHEST:  Unremarkable HEART:  PMI not displaced or sustained,S1 and S2 within normal limits, no S3, no S4, no clicks, no rubs, no murmurs. ABD:  Flat, positive bowel sounds normal in frequency in pitch, no bruits, no rebound, no guarding, no midline pulsatile mass, no hepatomegaly, no splenomegaly EXT:  2 plus pulses throughout, trace edema, no cyanosis no clubbing   EKG:  EKG is  ordered today. Sinus rhythm, rate 54, axis within normal limits, slightly prolonged QT, poor anterior R wave progression, no acute ST-T wave changes.  Recent Labs: 05/04/2016: ALT 19; TSH 2.227 06/06/2016: BUN 8; Creatinine, Ser 0.73; Hemoglobin 12.7; Magnesium 1.7; Platelets 202; Potassium 3.6; Sodium 131    Lipid Panel No results found for: CHOL, TRIG, HDL, CHOLHDL, VLDL, LDLCALC, LDLDIRECT    Wt Readings from Last 3 Encounters:  04/04/17 170 lb (77.1 kg)  12/03/16 170 lb 6 oz (77.3 kg)  08/16/16 164 lb 3.2 oz (74.5 kg)      Other studies Reviewed: Additional studies/ records that were reviewed today include:   Renal Doppler report and note from renal office Review of the above records demonstrates:      ASSESSMENT AND PLAN:  ATRIAL FIB:  Ms. [name] has a CHA2DS2 - VASc score of 4 with a risk of stroke of 4%.    She seems to be maintaining NSR.  I will continue with her meds as listed.  She is up to date with labs and at the last visit I gave instructions to her  daughter who is a nurse to get CBC, CMET and TSH twice yearly.   She is due to have labs next week.   HTN:  Her blood pressure is controlled to a reasonable degree.  She does not tolerate lower BPs symptomatically and would not want further med titration.  We talked again about PRN hydralazine as needed when her SBP is elevated.   MR/TR:  I wiil repeat echocardiography in the future.  Current medicines are reviewed at length with the patient today.  The patient does not have concerns regarding medicines.  The following changes have been made:   None  Labs/ tests ordered today include:  None  Orders Placed This Encounter  Procedures  . EKG 12-Lead     Disposition:   FU with me 6 months.    Signed, Minus Breeding, MD  04/04/2017 12:32 PM    [facility]

## 2017-04-04 ENCOUNTER — Encounter: Payer: Self-pay | Admitting: Cardiology

## 2017-04-04 ENCOUNTER — Ambulatory Visit (INDEPENDENT_AMBULATORY_CARE_PROVIDER_SITE_OTHER): Payer: Medicare Other | Admitting: Cardiology

## 2017-04-04 VITALS — BP 162/70 | HR 54 | Ht 67.0 in | Wt 170.0 lb

## 2017-04-04 DIAGNOSIS — I481 Persistent atrial fibrillation: Secondary | ICD-10-CM | POA: Diagnosis not present

## 2017-04-04 DIAGNOSIS — I10 Essential (primary) hypertension: Secondary | ICD-10-CM | POA: Diagnosis not present

## 2017-04-04 DIAGNOSIS — I4819 Other persistent atrial fibrillation: Secondary | ICD-10-CM

## 2017-04-04 NOTE — Patient Instructions (Signed)

## 2017-04-13 ENCOUNTER — Encounter (HOSPITAL_COMMUNITY): Payer: Medicare Other

## 2017-05-12 ENCOUNTER — Encounter (HOSPITAL_BASED_OUTPATIENT_CLINIC_OR_DEPARTMENT_OTHER): Payer: Medicare Other

## 2017-07-05 ENCOUNTER — Ambulatory Visit (HOSPITAL_BASED_OUTPATIENT_CLINIC_OR_DEPARTMENT_OTHER): Payer: Medicare Other | Attending: Internal Medicine | Admitting: Internal Medicine

## 2017-07-05 VITALS — Ht 67.0 in | Wt 170.0 lb

## 2017-07-05 DIAGNOSIS — Z79899 Other long term (current) drug therapy: Secondary | ICD-10-CM | POA: Diagnosis not present

## 2017-07-05 DIAGNOSIS — I493 Ventricular premature depolarization: Secondary | ICD-10-CM | POA: Diagnosis not present

## 2017-07-05 DIAGNOSIS — R0683 Snoring: Secondary | ICD-10-CM | POA: Insufficient documentation

## 2017-07-05 DIAGNOSIS — G4730 Sleep apnea, unspecified: Secondary | ICD-10-CM | POA: Diagnosis not present

## 2017-07-05 DIAGNOSIS — I10 Essential (primary) hypertension: Secondary | ICD-10-CM | POA: Diagnosis not present

## 2017-07-05 DIAGNOSIS — R351 Nocturia: Secondary | ICD-10-CM | POA: Insufficient documentation

## 2017-07-05 DIAGNOSIS — G4761 Periodic limb movement disorder: Secondary | ICD-10-CM | POA: Insufficient documentation

## 2017-07-05 DIAGNOSIS — R5383 Other fatigue: Secondary | ICD-10-CM

## 2017-07-15 ENCOUNTER — Other Ambulatory Visit (HOSPITAL_COMMUNITY): Payer: Self-pay | Admitting: Nurse Practitioner

## 2017-07-16 DIAGNOSIS — G4730 Sleep apnea, unspecified: Secondary | ICD-10-CM | POA: Diagnosis not present

## 2017-07-16 NOTE — Procedures (Signed)
   Patient Name: Sarah Armstrong, Sarah Armstrong Date: 07/05/2017 Gender: Female D.O.B: 10/31/1931 Age (years): 35 Referring Provider: Allie Dimmer MD Height (inches): 67 Interpreting Physician: Baird Lyons MD, ABSM Weight (lbs): 170 RPSGT: Carolin Coy BMI: 27 MRN: [id number] Neck Size: 14.00 CLINICAL INFORMATION Sleep Study Type: NPSG  Indication for sleep study: Hypertension  Epworth Sleepiness Score: 7  SLEEP STUDY TECHNIQUE As per the AASM Manual for the Scoring of Sleep and Associated Events v2.3 (April 2016) with a hypopnea requiring 4% desaturations.  The channels recorded and monitored were frontal, central and occipital EEG, electrooculogram (EOG), submentalis EMG (chin), nasal and oral airflow, thoracic and abdominal wall motion, anterior tibialis EMG, snore microphone, electrocardiogram, and pulse oximetry.  MEDICATIONS Medications self-administered by patient taken the night of the study : METOPROLOL, ZYRTEC, VALIUM, TYLENOL, HYDRALAZINE  SLEEP ARCHITECTURE The study was initiated at 10:55:16 PM and ended at 5:00:40 AM.  Sleep onset time was 13.6 minutes and the sleep efficiency was 39.8%. The total sleep time was 145.5 minutes.  Stage REM latency was 208.0 minutes.  The patient spent 15.12% of the night in stage N1 sleep, 81.10% in stage N2 sleep, 0.00% in stage N3 and 3.78% in REM.  Alpha intrusion was absent.  Supine sleep was 79.73%.  RESPIRATORY PARAMETERS The overall apnea/hypopnea index (AHI) was 1.2 per hour. There were 0 total apneas, including 0 obstructive, 0 central and 0 mixed apneas. There were 3 hypopneas and 34 RERAs.  The AHI during Stage REM sleep was 21.8 per hour.  AHI while supine was 1.6 per hour.  The mean oxygen saturation was 91.14%. The minimum SpO2 during sleep was 87.00%. Time Soft snoring was noted during this study.  CARDIAC DATA The 2 lead EKG demonstrated sinus rhythm. The mean heart rate was 51.32 beats per minute. Other  EKG findings include: PVCs . LEG MOVEMENT DATA The total PLMS were 102 with a resulting PLMS index of 42.06. Associated arousal with leg movement index was 2.1 .  IMPRESSIONS - No significant obstructive sleep apnea occurred during this study (AHI = 1.2/h). - No significant central sleep apnea occurred during this study (CAI = 0.0/h). - Mild oxygen desaturation was noted during this study (Min O2 = 87.00%). - The patient snored with Soft snoring volume. - EKG findings include PVCs. - Moderate periodic limb movements of sleep occurred during the study. No significant associated arousals. - Nocturia x 3  DIAGNOSIS  - Periodic Limb Movement Syndrome (327.51 [G47.61 ICD-10]) - Primary Snoring (786.09 [R06.83 ICD-10])  RECOMMENDATIONS - Consider if nocturia can be addressed to reduce sleep disturbance. - Consider trial of specific therapy for limb movement sleep disturbance, such as Requip or Mirapex. - Sleep hygiene should be reviewed to assess factors that may improve sleep quality. - Weight management and regular exercise should be initiated or continued if appropriate.  [Electronically signed] 07/16/2017 01:01 PM  Baird Lyons MD, [place], American Board of Sleep Medicine   NPI: [id number]  [place], [place] [facility]  ELECTRONICALLY SIGNED ON:  07/16/2017, 12:54 PM [place] PH: [phone]   FX: [phone] [place]

## 2017-07-18 NOTE — Telephone Encounter (Signed)
Rx(s) sent to pharmacy electronically.  

## 2017-10-06 NOTE — Progress Notes (Signed)
I have personally oh     Cardiology Office Note   Date:  10/07/2017   ID:  Sarah Armstrong, DOB [birth year], MRN [id number]  PCP:  Allie Dimmer, MD  Cardiologist:   Minus Breeding, MD   Chief Complaint  Patient presents with  . Atrial Fibrillation     History of Present Illness: Sarah Armstrong is a 83 y.o. female who presents for evaluation of HTN.  I have seen her for atrial fibrillation. She was found to have this in January of last year. She had been caring for her husband who had chronic health issues and who subsequently died in 02-05-2016.  She is being followed by nephrology.  She had a renal ultrasound in March that did not demonstrate renal artery stenosis.  She had negative metanephrines as well.  She has been doing relatively well.  He does have some frequent urination at night.  Occasionally at night she will feel some headaches and she will take her blood pressure and rarely it has been around 180 and she will take as needed hydralazine.  Not having to do this very frequently.  Her blood pressures are typically running in the 140s.  She does not feel well if it is lower.  She is not having any new palpitations, presyncope or syncope.  She has no new chest pressure, neck or arm discomfort.  She has not had any ankle edema at night.   Past Medical History:  Diagnosis Date  . Atrial fibrillation ([place])   . Cancer ([place]) skin  . GERD (gastroesophageal reflux disease)   . Hyperlipidemia   . Hypertension   . Stroke [facility])     Past Surgical History:  Procedure Laterality Date  . CATARACT EXTRACTION  2015  . COLONOSCOPY  2009  . TONSILLECTOMY  1953  . TOTAL ABDOMINAL HYSTERECTOMY  1983     Current Outpatient Medications  Medication Sig Dispense Refill  . acetaminophen (TYLENOL) 500 MG tablet Take 500 mg by mouth 2 (two) times daily as needed for mild pain.     Marland Kitchen amiodarone (PACERONE) 200 MG tablet Take 1 tablet (200 mg total) by mouth daily. 90 tablet 3  . atorvastatin (LIPITOR)  20 MG tablet Take 1 tablet by mouth daily.    . cetirizine (ZYRTEC) 10 MG tablet Take 10 mg by mouth daily.    . Cholecalciferol (VITAMIN D3 SUPER STRENGTH) 2000 units TABS Take 1 tablet by mouth daily.    . Cranberry 500 MG CAPS Take 500 mg by mouth daily.    . diazepam (VALIUM) 5 MG tablet Take 1 tablet by mouth as needed for anxiety.     Marland Kitchen diltiazem (CARTIA XT) 120 MG 24 hr capsule Take 1 capsule (120 mg total) by mouth daily. 90 capsule 1  . estradiol (ESTRACE) 0.1 MG/GM vaginal cream Place 1 Applicatorful vaginally 2 (two) times a week. Wed and Sun    . hydrALAZINE (APRESOLINE) 25 MG tablet Take 1 tablet (25 mg total) by mouth 3 (three) times daily. and as Needed 270 tablet 3  . losartan (COZAAR) 50 MG tablet Take 50 mg by mouth daily.     . metoprolol (LOPRESSOR) 100 MG tablet Take 100 mg by mouth 2 (two) times daily.    . Multiple Vitamins-Minerals (CENTRUM PO) Take 1 tablet by mouth daily. Not taking on Wed and Sun    . oxymetazoline (AFRIN) 0.05 % nasal spray Place 1 spray into both nostrils 2 (two) times daily as needed (for nose bleeds).    Marland Kitchen  Probiotic Product (PROBIOTIC FORMULA) CAPS Take 1 capsule by mouth as needed.     . ranitidine (ZANTAC) 150 MG tablet Take 150 mg by mouth daily.     . rivaroxaban (XARELTO) 15 MG TABS tablet Take 1 tablet (15 mg total) by mouth daily with supper. 90 tablet 3   No current facility-administered medications for this visit.     Allergies:   Aspirin; Other; Strawberry extract; Codeine; Enalapril; Melatonin; Penicillin g; Shellfish-derived products; and Latex    ROS:  Please see the history of present illness.   Otherwise, review of systems are positive for watery eyes, leg weakness.   All other systems are reviewed and negative.    PHYSICAL EXAM: VS:  BP (!) 160/70   Pulse (!) 58   Ht 5\' 7"  (1.702 m)   Wt 175 lb 12.8 oz (79.7 kg)   SpO2 97%   BMI 27.53 kg/m  , BMI Body mass index is 27.53 kg/m.  GENERAL:  Well appearing NECK:  No  jugular venous distention, waveform within normal limits, carotid upstroke brisk and symmetric, no bruits, no thyromegaly LUNGS:  Clear to auscultation bilaterally CHEST:  Unremarkable HEART:  PMI not displaced or sustained,S1 and S2 within normal limits, no S3, no S4, no clicks, no rubs, no murmurs ABD:  Flat, positive bowel sounds normal in frequency in pitch, no bruits, no rebound, no guarding, no midline pulsatile mass, no hepatomegaly, no splenomegaly EXT:  2 plus pulses throughout, no edema, no cyanosis no clubbing    EKG:  EKG is not ordered today.  Recent Labs: No results found for requested labs within last 8760 hours.    Lipid Panel No results found for: CHOL, TRIG, HDL, CHOLHDL, VLDL, LDLCALC, LDLDIRECT    Wt Readings from Last 3 Encounters:  10/07/17 175 lb 12.8 oz (79.7 kg)  07/05/17 170 lb (77.1 kg)  04/04/17 170 lb (77.1 kg)      Other studies Reviewed: Additional studies/ records that were reviewed today include:   None Review of the above records demonstrates:       ASSESSMENT AND PLAN:  ATRIAL FIB:  Ms. Sarah Armstrong has a CHA2DS2 - VASc score of 4 with a risk of stroke of 4%.  She is maintaining NSR.  She tolerates anticoagulation.  No change in therapy is indicated.  Her blood pressure seems to be reasonably controlled with occasional as needed hydralazine.  No change in therapy is indicated.  HTN: Her blood pressure seems to be reasonably well controlled with as needed hydralazine and meds as listed.  No change in therapy is indicated.  MR/TR:   This was only mild and I will follow this clinically.  .     Current medicines are reviewed at length with the patient today.  The patient does not have concerns regarding medicines.  The following changes have been made:   None  Labs/ tests ordered today include:  None  No orders of the defined types were placed in this encounter.    Disposition:   FU with me 6 months.    Signed, Minus Breeding, MD    10/07/2017 10:06 AM    [place]

## 2017-10-07 ENCOUNTER — Encounter: Payer: Self-pay | Admitting: Cardiology

## 2017-10-07 ENCOUNTER — Ambulatory Visit (INDEPENDENT_AMBULATORY_CARE_PROVIDER_SITE_OTHER): Payer: Medicare Other | Admitting: Cardiology

## 2017-10-07 VITALS — BP 160/70 | HR 58 | Ht 67.0 in | Wt 175.8 lb

## 2017-10-07 DIAGNOSIS — I959 Hypotension, unspecified: Secondary | ICD-10-CM | POA: Insufficient documentation

## 2017-10-07 DIAGNOSIS — I340 Nonrheumatic mitral (valve) insufficiency: Secondary | ICD-10-CM

## 2017-10-07 DIAGNOSIS — I480 Paroxysmal atrial fibrillation: Secondary | ICD-10-CM

## 2017-10-07 DIAGNOSIS — I10 Essential (primary) hypertension: Secondary | ICD-10-CM | POA: Diagnosis not present

## 2017-10-07 NOTE — Patient Instructions (Signed)

## 2017-12-21 ENCOUNTER — Telehealth: Payer: Self-pay | Admitting: Cardiology

## 2017-12-21 NOTE — Telephone Encounter (Signed)
Returned call to patient.She stated she has been feeling bad,no energy,low B/P.123/47 pulse 50. No chest pain.No fast heart beat.Appointment scheduled with Jory Sims DNP 12/27/17 at 11:30 am.

## 2017-12-21 NOTE — Telephone Encounter (Signed)
Pt c/o medication issue:  1. Name of Medication: unknown   2. How are you currently taking this medication (dosage and times per day)? Unknown   3. Are you having a reaction (difficulty breathing--STAT)? no  4. What is your medication issue? Pt verbalized that she is calling for the RN  To go over her medications she want to see which one is making her bp low

## 2017-12-22 ENCOUNTER — Other Ambulatory Visit (HOSPITAL_COMMUNITY): Payer: Self-pay | Admitting: Cardiology

## 2017-12-27 ENCOUNTER — Ambulatory Visit: Payer: Medicare Other | Admitting: Adult Health

## 2017-12-30 ENCOUNTER — Telehealth: Payer: Self-pay | Admitting: Cardiology

## 2017-12-30 NOTE — Telephone Encounter (Signed)
New message    Patient calling to request blood pressure medication be adjusted.  BP today 125/46, Jan 31, 122/45. Patient declined to schedule appointment at this time.  Please call   Pt c/o medication issue:  1. Name of Medication: n/a  2. How are you currently taking this medication (dosage and times per day)? As prescribed  3. Are you having a reaction (difficulty breathing--STAT)? no  4. What is your medication issue? Patient wants to decrease medication

## 2017-12-30 NOTE — Telephone Encounter (Signed)
Returned call to patient.She stated she has been feeling bad,no energy,low B/P.120's/45-50's pulse 50-55. No chest pain.No fast heart beat. Denies any other sx, dizziness , syncope, etc... She states that she takes her hydralazine at 6 am then eats breakfast then takes all her other medications at 8am. She takes her losartan at dinner time. She states that by 10 am she is feeling bad  She will eat something again and "eats some salt' and this makes her feel a little better and her BP "comes up a bit" but was not able to give he the numbers at that time.  Scheduled appt to see app Wednesday 01-04-17. She states that she will cont to log BP and bring this with her to appt. She will go to the ER if needed

## 2018-01-04 ENCOUNTER — Encounter: Payer: Self-pay | Admitting: Physician Assistant

## 2018-01-04 ENCOUNTER — Ambulatory Visit (INDEPENDENT_AMBULATORY_CARE_PROVIDER_SITE_OTHER): Payer: Medicare Other | Admitting: Physician Assistant

## 2018-01-04 ENCOUNTER — Other Ambulatory Visit: Payer: Self-pay | Admitting: Physician Assistant

## 2018-01-04 VITALS — BP 148/50 | HR 55 | Ht 67.0 in | Wt 173.0 lb

## 2018-01-04 DIAGNOSIS — E785 Hyperlipidemia, unspecified: Secondary | ICD-10-CM | POA: Diagnosis not present

## 2018-01-04 DIAGNOSIS — R001 Bradycardia, unspecified: Secondary | ICD-10-CM

## 2018-01-04 DIAGNOSIS — R531 Weakness: Secondary | ICD-10-CM

## 2018-01-04 DIAGNOSIS — Z8673 Personal history of transient ischemic attack (TIA), and cerebral infarction without residual deficits: Secondary | ICD-10-CM | POA: Diagnosis not present

## 2018-01-04 DIAGNOSIS — I10 Essential (primary) hypertension: Secondary | ICD-10-CM

## 2018-01-04 DIAGNOSIS — I480 Paroxysmal atrial fibrillation: Secondary | ICD-10-CM | POA: Diagnosis not present

## 2018-01-04 MED ORDER — METOPROLOL TARTRATE 75 MG PO TABS: 75.0000 mg | ORAL_TABLET | Freq: Two times a day (BID) | ORAL | 6 refills | Status: DC

## 2018-01-04 MED ORDER — HYDRALAZINE HCL 10 MG PO TABS: 10.0000 mg | ORAL_TABLET | ORAL | 6 refills | Status: DC

## 2018-01-04 MED ORDER — METOPROLOL TARTRATE 50 MG PO TABS: 75.0000 mg | ORAL_TABLET | Freq: Two times a day (BID) | ORAL | 3 refills | Status: DC

## 2018-01-04 NOTE — Telephone Encounter (Signed)
New message   Pharmacy calling:  Pt c/o medication issue:  1. Name of Medication: metoprolol tartrate 75 MG TABS  2. How are you currently taking this medication (dosage and times per day)? As prescribed  3. Are you having a reaction (difficulty breathing--STAT)? No  4. What is your medication issue? Medication on back order. Please call [phone]

## 2018-01-04 NOTE — Progress Notes (Signed)
Cardiology Office Note    Date:  01/04/2018   ID:  Sarah Armstrong, DOB 1931-11-24, MRN [id number]  PCP:  Allie Dimmer, MD  Cardiologist:  Dr. Percival Spanish  Chief Complaint  Patient presents with  . Follow-up    seen for Dr. Percival Spanish.     History of Present Illness:  Sarah Armstrong is a 84 y.o. female with PMH of HTN, PAF, HLD, CVA, and GERD.  She also had a 2D echo obtained in [place] on 12/23/2015 that showed EF 60-65%, mild mitral and tricuspid regurgitation.  She had a stress echocardiogram in January 2017 that was negative.  Renal artery duplex in March 2018 was negative for bilateral renal artery stenosis.  She did have a split-night sleep study on 07/05/2017, she had periodic limb movement, it was recommended for to potentially try a trial of Requip or Mirapex.  Weight management and regular exercise should also be initiated.  It was recommended for her to potentially address nocturia to reduce sleep disturbance  Patient presents today for cardiology office evaluation of weakness and fatigue.  She is accompanied by her daughter.  Based on recent lab work, her hemoglobin is stable, electrolytes normal, TSH normal.  At home, her systolic blood pressure has been in the 120s most of the time.  Her heart rate mainly in the low 50s.  She denies any significant dizziness or feeling of passing out.  She is concerned her heart rate is very slow.  I do not see any secondary causes for her fatigue.  She denies any chest pain, there is no lower extremity edema, orthopnea or PND.  I did decrease her metoprolol to 75 mg twice daily.  She seems to be quite sensitive to medication titration.  She will continue to monitor her blood pressure at home.  Also note, despite our records shows that she is on long-acting diltiazem 120 mg at home, she has not taken this medication recently.  Also she is taking 25 mg twice daily of hydralazine with additional 10 mg hydralazine at 6 AM.  She says her blood pressure  tends to be the lowest in the morning after she first get up.  She says she feels her leg is weak in the morning.  She does have some sleep disturbance, I recommended for her to talk to her primary care provider regarding this.   Past Medical History:  Diagnosis Date  . Atrial fibrillation ([place])   . Cancer ([place]) skin  . GERD (gastroesophageal reflux disease)   . Hyperlipidemia   . Hypertension   . Stroke [facility]     Past Surgical History:  Procedure Laterality Date  . CARDIOVERSION N/A 03/04/2016   Procedure: CARDIOVERSION;  Surgeon: Sueanne Margarita, MD;  Location: [facility];  Service: Cardiovascular;  Laterality: N/A;  . CATARACT EXTRACTION  2015  . COLONOSCOPY  2009  . TONSILLECTOMY  1953  . TOTAL ABDOMINAL HYSTERECTOMY  1983    Current Medications: Outpatient Medications Prior to Visit  Medication Sig Dispense Refill  . acetaminophen (TYLENOL) 500 MG tablet Take 500 mg by mouth 2 (two) times daily as needed for mild pain.     Marland Kitchen amiodarone (PACERONE) 200 MG tablet Take 1 tablet (200 mg total) by mouth daily. 90 tablet 3  . atorvastatin (LIPITOR) 20 MG tablet Take 1 tablet by mouth daily.    . cetirizine (ZYRTEC) 10 MG tablet Take 10 mg by mouth daily.    . Cholecalciferol (VITAMIN D3 SUPER STRENGTH) 2000 units TABS  Take 1 tablet by mouth daily.    . Cranberry 500 MG CAPS Take 500 mg by mouth daily.    . diazepam (VALIUM) 5 MG tablet Take 1 tablet by mouth as needed for anxiety.     Marland Kitchen estradiol (ESTRACE) 0.1 MG/GM vaginal cream Place 1 Applicatorful vaginally 2 (two) times a week. Wed and Sun    . losartan (COZAAR) 50 MG tablet Take 50 mg by mouth daily.     . Multiple Vitamins-Minerals (CENTRUM PO) Take 1 tablet by mouth daily. Not taking on Wed and Sun    . oxymetazoline (AFRIN) 0.05 % nasal spray Place 1 spray into both nostrils 2 (two) times daily as needed (for nose bleeds).    . Probiotic Product (PROBIOTIC FORMULA) CAPS Take 1 capsule by mouth as needed.     . ranitidine  (ZANTAC) 150 MG tablet Take 150 mg by mouth daily.     . rivaroxaban (XARELTO) 15 MG TABS tablet Take 1 tablet (15 mg total) by mouth daily with supper. 90 tablet 3  . CARTIA XT 120 MG 24 hr capsule TAKE 1 CAPSULE DAILY 90 capsule 2  . hydrALAZINE (APRESOLINE) 10 MG tablet Take 10 mg by mouth every morning.     . hydrALAZINE (APRESOLINE) 25 MG tablet Take 1 tablet (25 mg total) by mouth 3 (three) times daily. and as Needed (Patient taking differently: Take 25 mg by mouth 2 (two) times daily. Taking at 2pm and 10pm) 270 tablet 3  . metoprolol (LOPRESSOR) 100 MG tablet Take 100 mg by mouth 2 (two) times daily.    . hydrALAZINE (APRESOLINE) 25 MG tablet Take 1 tablet by mouth 2 (two) times daily.     No facility-administered medications prior to visit.      Allergies:   Aspirin; Other; Strawberry extract; Codeine; Enalapril; Melatonin; Penicillin g; Shellfish allergy; Shellfish-derived products; and Latex   Social History   Socioeconomic History  . Marital status: Unknown    Spouse name: None  . Number of children: 2  . Years of education: None  . Highest education level: None  Social Needs  . Financial resource strain: None  . Food insecurity - worry: None  . Food insecurity - inability: None  . Transportation needs - medical: None  . Transportation needs - non-medical: None  Occupational History  . None  Tobacco Use  . Smoking status: Never Smoker  . Smokeless tobacco: Never Used  Substance and Sexual Activity  . Alcohol use: No    Alcohol/week: 0.0 oz  . Drug use: No  . Sexual activity: None  Other Topics Concern  . None  Social History Narrative   Lives alone.  Husband died 03/20/16     Family History:  The patient's family history includes COPD in her sister; Cancer in her maternal grandfather; Diabetes in her child; Heart disease in her father; Heart disease (age of onset: 26) in her brother and brother; Hypertension in her child and mother; Leukemia in her brother;  Parkinson's disease in her sister; Stroke in her maternal grandmother.   ROS:   Please see the history of present illness.    ROS All other systems reviewed and are negative.   PHYSICAL EXAM:   VS:  BP (!) 148/50   Pulse (!) 55   Ht 5\' 7"  (1.702 m)   Wt 173 lb (78.5 kg)   BMI 27.10 kg/m    GEN: Well nourished, well developed, in no acute distress  HEENT: normal  Neck: no  JVD, carotid bruits, or masses Cardiac: RRR; no murmurs, rubs, or gallops,no edema  Respiratory:  clear to auscultation bilaterally, normal work of breathing GI: soft, nontender, nondistended, + BS MS: no deformity or atrophy  Skin: warm and dry, no rash Neuro:  Alert and Oriented x 3, Strength and sensation are intact Psych: euthymic mood, full affect  Wt Readings from Last 3 Encounters:  01/04/18 173 lb (78.5 kg)  10/07/17 175 lb 12.8 oz (79.7 kg)  07/05/17 170 lb (77.1 kg)      Studies/Labs Reviewed:   EKG:  EKG is ordered today.  The ekg ordered today demonstrates sinus bradycardia, heart rate 50, otherwise no significant ST-T wave changes.  Recent Labs: No results found for requested labs within last 8760 hours.   Lipid Panel No results found for: CHOL, TRIG, HDL, CHOLHDL, VLDL, LDLCALC, LDLDIRECT  Additional studies/ records that were reviewed today include:   Renal artery duplex 02/10/2017 IMPRESSION: Negative for renal artery stenosis. No acute finding by renal ultrasound.   ASSESSMENT:    1. Weakness   2. Bradycardia   3. PAF (paroxysmal atrial fibrillation) ([place])   4. Essential hypertension   5. Hyperlipidemia, unspecified hyperlipidemia type   6. H/O: CVA (cerebrovascular accident)      PLAN:  In order of problems listed above:  1. Weakness: Associated with fatigue as well.  Has been ongoing for several month.  Recent lab work shows normal renal function, electrolytes, hemoglobin and TSH.  She describes weakness in her legs.  Sometimes she eats more salt in order to improve  her symptom.  She says it mainly occurs in the morning.  Her heart rate has been bradycardic recently mainly in the 50s.  Heart rate was exactly 50 on EKG.  I will decrease her metoprolol to 75 mg twice daily.  Note, patient is actually not taking Cartia XT 120 mg at home.   2. Bradycardia: Decrease metoprolol tartrate to 75 mg twice daily.  3. PAF: No obvious recurrence.  Heart rate very well controlled.  Continue on Xarelto.  She is also on 200 mg daily of amiodarone.  If symptom persist, may decide to decrease amiodarone to 100 mg daily.  4. Hypertension: Blood pressure mildly elevated here, however normally runs in the 120s at home.  5. Hyperlipidemia: Continue Lipitor 20 mg daily.  Lipid panel fairly controlled based on lab work brought in by the patient's daughter today.    Medication Adjustments/Labs and Tests Ordered: Current medicines are reviewed at length with the patient today.  Concerns regarding medicines are outlined above.  Medication changes, Labs and Tests ordered today are listed in the Patient Instructions below. Patient Instructions  Medication Instructions:  DECREASE Metoprolol to 75 mg take 1 tablet twice a day  Labwork: None   Testing/Procedures: None   Follow-Up: Your physician recommends that you schedule a follow-up appointment in: 4-6 months with Dr Percival Spanish.  Any Other Special Instructions Will Be Listed Below (If Applicable). If you need a refill on your cardiac medications before your next appointment, please call your pharmacy.     Hilbert Corrigan, [place]  01/04/2018 4:38 PM    [facility] [address], [place], [place]  [address] Phone: [phone]; Fax: [phone]

## 2018-01-04 NOTE — Patient Instructions (Addendum)
Medication Instructions:  DECREASE Metoprolol to 75 mg take 1 tablet twice a day  Labwork: None   Testing/Procedures: None   Follow-Up: Your physician recommends that you schedule a follow-up appointment in: 4-6 months with Dr Percival Spanish.  Any Other Special Instructions Will Be Listed Below (If Applicable). If you need a refill on your cardiac medications before your next appointment, please call your pharmacy.

## 2018-01-04 NOTE — Telephone Encounter (Signed)
Pharmacy calling to let our office know patient's metoprolol 75 mg tablets are on back order. Pharmacy asked if patient could take metoprolol 50 mg tablet instead and cut them in half. Informed pharmacy that should be fine. Will forward to [employer] PA so he is aware and if he wants to make changes.

## 2018-01-24 ENCOUNTER — Telehealth: Payer: Self-pay | Admitting: Physician Assistant

## 2018-01-24 NOTE — Telephone Encounter (Signed)
Returned call to patient who saw H. Meng PA on 2/6 Metoprolol tartrate was decreased from 100mg  BID to 75mg  BID Her BP is running 135-145/50-55 and HR is 52-55bpm She checks BP 10am and 330pm She is feeling a little bit stronger but is still weak in the AM Will defer to PA and MD to review and advise patient if beta-blocker dose should be decreased further

## 2018-01-24 NOTE — Telephone Encounter (Signed)
New Message   Pt c/o medication issue:  1. Name of Medication: metoprolol tartrate (LOPRESSOR) 50 MG tablet  2. How are you currently taking this medication (dosage and times per day)? 1.5 twice a day   3. Are you having a reaction (difficulty breathing--STAT)? No   4. What is your medication issue? Patient states that Sarah Armstrong changed her dosage on this medication due to her heart rate being low. She indicates that it has not changed much so she is wondering should she go to one pill a day. Please call to discuss.

## 2018-01-24 NOTE — Telephone Encounter (Signed)
I have called the patient and instructed her to continue on the current dose of metoprolol.

## 2018-02-03 ENCOUNTER — Other Ambulatory Visit: Payer: Self-pay | Admitting: Cardiology

## 2018-02-03 NOTE — Telephone Encounter (Signed)
REFILL 

## 2018-02-14 ENCOUNTER — Other Ambulatory Visit: Payer: Self-pay

## 2018-02-14 ENCOUNTER — Telehealth: Payer: Self-pay | Admitting: Physician Assistant

## 2018-02-14 NOTE — Addendum Note (Signed)
Addended by: Kathyrn Lass on: 02/14/2018 05:52 PM   Modules accepted: Orders

## 2018-02-14 NOTE — Telephone Encounter (Signed)
Advised to call back mother's symptoms do not improve.

## 2018-02-14 NOTE — Telephone Encounter (Signed)
Returned call to patient's daughter [name].She stated metoprolol was decreased to 75 mg twice a day.Stated mother still feels tired.B/P continues to be low before lunch 111/58 pulse 50's.Spoke to [employer] PA he advised decrease losartan to 25 mg daily.Continue metoprolol 75 mg twice a day and all other medications.

## 2018-02-14 NOTE — Telephone Encounter (Signed)
(  Ivin Booty )Patient daughter calling,    States that she would like to discuss some concerns with nurse

## 2018-02-23 NOTE — Progress Notes (Signed)
Cardiology Office Note   Date:  02/24/2018   ID:  Sarah Armstrong, DOB June 06, 1931, MRN [id number]  PCP:  Allie Dimmer, MD  Cardiologist:   Minus Breeding, MD   Chief Complaint  Patient presents with  . Fatigue     History of Present Illness: Sarah Armstrong is a 84 y.o. female who presents for evaluation of HTN.  I have seen her for atrial fibrillation. She was found to have this in January of 2017.  She had a renal ultrasound that was normal and negative metanephrines.  At the last visit she was complaining of weakness and she had her beta blocker reduced.   At home she reduce the dose again to 50 mg but she still very weak.  She gets short of breath with activity but this has been chronic.  She is been sleeping on 2 pillows but this has been chronic.  Her heart rates have been in the mid 50s consistently and she feels like this is too low.  Her diastolic blood pressures have been in the 20N but her systolics have been in the 120s-150s.  She says she feels better when it is 140.  She has not had any frank syncope.  She is not had any chest pressure, neck or arm discomfort.  She has not had any weight gain or edema.   Past Medical History:  Diagnosis Date  . Atrial fibrillation ([place])   . Cancer ([place]) skin  . GERD (gastroesophageal reflux disease)   . Hyperlipidemia   . Hypertension   . Stroke [facility])     Past Surgical History:  Procedure Laterality Date  . CARDIOVERSION N/A 03/04/2016   Procedure: CARDIOVERSION;  Surgeon: Sueanne Margarita, MD;  Location: [place];  Service: Cardiovascular;  Laterality: N/A;  . CATARACT EXTRACTION  2015  . COLONOSCOPY  2009  . TONSILLECTOMY  1953  . TOTAL ABDOMINAL HYSTERECTOMY  1983     Current Outpatient Medications  Medication Sig Dispense Refill  . acetaminophen (TYLENOL) 500 MG tablet Take 500 mg by mouth 2 (two) times daily as needed for mild pain.     Marland Kitchen amiodarone (PACERONE) 200 MG tablet Take 1 tablet (200 mg total) by mouth daily. 90  tablet 3  . atorvastatin (LIPITOR) 20 MG tablet Take 1 tablet by mouth daily.    . cetirizine (ZYRTEC) 10 MG tablet Take 10 mg by mouth daily.    . Cholecalciferol (VITAMIN D3 SUPER STRENGTH) 2000 units TABS Take 1 tablet by mouth daily.    . Cranberry 500 MG CAPS Take 500 mg by mouth daily.    . diazepam (VALIUM) 5 MG tablet Take 5 mg by mouth daily as needed for anxiety.    Marland Kitchen diltiazem (CARDIZEM) 120 MG tablet Take 120 mg by mouth 4 (four) times daily.    Marland Kitchen estradiol (ESTRACE) 0.1 MG/GM vaginal cream Place 1 Applicatorful vaginally 2 (two) times a week. Wed and Sun    . hydrALAZINE (APRESOLINE) 10 MG tablet Take 10 mg by mouth daily.    . hydrALAZINE (APRESOLINE) 25 MG tablet Take 1 tablet by mouth 2 (two) times daily.    Marland Kitchen losartan (COZAAR) 50 MG tablet TAKE 1 TABLET DAILY    . Multiple Vitamins-Minerals (CENTRUM PO) Take 1 tablet by mouth daily. Not taking on Wed and Sun    . Probiotic Product (PROBIOTIC FORMULA) CAPS Take 1 capsule by mouth as needed.     . rivaroxaban (XARELTO) 15 MG TABS tablet Take 1 tablet (  15 mg total) by mouth daily with supper. 90 tablet 3   No current facility-administered medications for this visit.     Allergies:   Aspirin; Other; Strawberry extract; Codeine; Enalapril; Melatonin; Penicillin g; Shellfish allergy; Shellfish-derived products; and Latex    ROS:  Please see the history of present illness.   Otherwise, review of systems are positive twitching of her left leg at night..   All other systems are reviewed and negative.    PHYSICAL EXAM: VS:  BP 137/61   Pulse (!) 59   Ht 5\' 7"  (1.702 m)   Wt 171 lb 3.2 oz (77.7 kg)   BMI 26.81 kg/m  , BMI Body mass index is 26.81 kg/m.  GENERAL:  Well appearing NECK:  No jugular venous distention, waveform within normal limits, carotid upstroke brisk and symmetric, right bruits, no thyromegaly LUNGS:  Clear to auscultation bilaterally CHEST:  Unremarkable HEART:  PMI not displaced or sustained,S1 and S2  within normal limits, no S3, no S4, no clicks, no rubs, no murmurs ABD:  Flat, positive bowel sounds normal in frequency in pitch, no bruits, no rebound, no guarding, no midline pulsatile mass, no hepatomegaly, no splenomegaly EXT:  2 plus pulses throughout, no edema, no cyanosis no clubbing    EKG:  EKG is not ordered today. NA   Recent Labs: No results found for requested labs within last 8760 hours.    Lipid Panel No results found for: CHOL, TRIG, HDL, CHOLHDL, VLDL, LDLCALC, LDLDIRECT    Wt Readings from Last 3 Encounters:  02/24/18 171 lb 3.2 oz (77.7 kg)  01/04/18 173 lb (78.5 kg)  10/07/17 175 lb 12.8 oz (79.7 kg)      Other studies Reviewed: Additional studies/ records that were reviewed today include:   Labs Review of the above records demonstrates:       ASSESSMENT AND PLAN:  ATRIAL FIB:  Ms. Sarah Armstrong has a CHA2DS2 - VASc score of 4 with a risk of stroke of 4%.    She is maintaining NSR.  She tolerates anticoagulation.  I will make changes as above.   HTN:   Given her symptoms with bradycardia she will stop the metoprolol completely.  She will keep an eye on her blood pressure.  Otherwise no change in therapy at this point.  MR/TR:     This was mild MR and TR and I will follow this clinically.  WEAKNESS:  She has been diagnosed with a sleep disorder.  This could be related.  She was also bradycardiac and her beta blocker was reduced at the last visit.  As above I will stop the beta-blocker.  The next step might be to stop the Cardizem.  DYSLIPIDEMIA: I did review labs and her last LDL was 80 with an HDL 67.  She will otherwise continue the meds as listed.  BRUIT: She will get a carotid Doppler when she comes back to this office.    Current medicines are reviewed at length with the patient today.  The patient does not have concerns regarding medicines.  The following changes have been made:   As above.    Labs/ tests ordered today include:    No orders  of the defined types were placed in this encounter.    Disposition:   FU with me 6 weeks.    Signed, Minus Breeding, MD  02/24/2018 11:01 AM    [place]

## 2018-02-24 ENCOUNTER — Ambulatory Visit (INDEPENDENT_AMBULATORY_CARE_PROVIDER_SITE_OTHER): Payer: Medicare Other | Admitting: Cardiology

## 2018-02-24 ENCOUNTER — Encounter: Payer: Self-pay | Admitting: Cardiology

## 2018-02-24 VITALS — BP 137/61 | HR 59 | Ht 67.0 in | Wt 171.2 lb

## 2018-02-24 DIAGNOSIS — E785 Hyperlipidemia, unspecified: Secondary | ICD-10-CM

## 2018-02-24 DIAGNOSIS — R001 Bradycardia, unspecified: Secondary | ICD-10-CM | POA: Insufficient documentation

## 2018-02-24 DIAGNOSIS — I481 Persistent atrial fibrillation: Secondary | ICD-10-CM | POA: Diagnosis not present

## 2018-02-24 DIAGNOSIS — I10 Essential (primary) hypertension: Secondary | ICD-10-CM

## 2018-02-24 DIAGNOSIS — I6523 Occlusion and stenosis of bilateral carotid arteries: Secondary | ICD-10-CM

## 2018-02-24 DIAGNOSIS — I4819 Other persistent atrial fibrillation: Secondary | ICD-10-CM

## 2018-02-24 NOTE — Patient Instructions (Addendum)
Medication Instructions:  STOP- Metoprolol  If you need a refill on your cardiac medications before your next appointment, please call your pharmacy.  Labwork: None Ordered   Testing/Procedures: Your physician has requested that you have a carotid duplex. This test is an ultrasound of the carotid arteries in your neck. It looks at blood flow through these arteries that supply the brain with blood. Allow one hour for this exam. There are no restrictions or special instructions.  Follow-Up: Your physician wants you to follow-up in: 6 Weeks with Dr Percival Spanish.     Thank you for choosing [facility]!!

## 2018-03-07 ENCOUNTER — Inpatient Hospital Stay (HOSPITAL_BASED_OUTPATIENT_CLINIC_OR_DEPARTMENT_OTHER)
Admission: EM | Admit: 2018-03-07 | Discharge: 2018-03-10 | DRG: 378 | Disposition: A | Discharge status: Home / Self Care | Payer: Medicare Other | Attending: Internal Medicine | Admitting: Internal Medicine

## 2018-03-07 ENCOUNTER — Emergency Department (HOSPITAL_BASED_OUTPATIENT_CLINIC_OR_DEPARTMENT_OTHER): Payer: Medicare Other

## 2018-03-07 ENCOUNTER — Other Ambulatory Visit: Payer: Self-pay

## 2018-03-07 ENCOUNTER — Encounter (HOSPITAL_BASED_OUTPATIENT_CLINIC_OR_DEPARTMENT_OTHER): Payer: Self-pay | Admitting: *Deleted

## 2018-03-07 DIAGNOSIS — Z7901 Long term (current) use of anticoagulants: Secondary | ICD-10-CM

## 2018-03-07 DIAGNOSIS — Z823 Family history of stroke: Secondary | ICD-10-CM | POA: Diagnosis not present

## 2018-03-07 DIAGNOSIS — Z9849 Cataract extraction status, unspecified eye: Secondary | ICD-10-CM | POA: Diagnosis not present

## 2018-03-07 DIAGNOSIS — Z91040 Latex allergy status: Secondary | ICD-10-CM

## 2018-03-07 DIAGNOSIS — I481 Persistent atrial fibrillation: Secondary | ICD-10-CM | POA: Diagnosis present

## 2018-03-07 DIAGNOSIS — Z90710 Acquired absence of both cervix and uterus: Secondary | ICD-10-CM

## 2018-03-07 DIAGNOSIS — D120 Benign neoplasm of cecum: Secondary | ICD-10-CM | POA: Diagnosis present

## 2018-03-07 DIAGNOSIS — Z888 Allergy status to other drugs, medicaments and biological substances status: Secondary | ICD-10-CM

## 2018-03-07 DIAGNOSIS — Z91018 Allergy to other foods: Secondary | ICD-10-CM

## 2018-03-07 DIAGNOSIS — K922 Gastrointestinal hemorrhage, unspecified: Secondary | ICD-10-CM | POA: Diagnosis present

## 2018-03-07 DIAGNOSIS — Z885 Allergy status to narcotic agent status: Secondary | ICD-10-CM

## 2018-03-07 DIAGNOSIS — Z8249 Family history of ischemic heart disease and other diseases of the circulatory system: Secondary | ICD-10-CM

## 2018-03-07 DIAGNOSIS — Z79899 Other long term (current) drug therapy: Secondary | ICD-10-CM

## 2018-03-07 DIAGNOSIS — N179 Acute kidney failure, unspecified: Secondary | ICD-10-CM

## 2018-03-07 DIAGNOSIS — D123 Benign neoplasm of transverse colon: Secondary | ICD-10-CM | POA: Diagnosis present

## 2018-03-07 DIAGNOSIS — K921 Melena: Principal | ICD-10-CM | POA: Diagnosis present

## 2018-03-07 DIAGNOSIS — D649 Anemia, unspecified: Secondary | ICD-10-CM

## 2018-03-07 DIAGNOSIS — Z880 Allergy status to penicillin: Secondary | ICD-10-CM | POA: Diagnosis not present

## 2018-03-07 DIAGNOSIS — K449 Diaphragmatic hernia without obstruction or gangrene: Secondary | ICD-10-CM | POA: Diagnosis present

## 2018-03-07 DIAGNOSIS — K219 Gastro-esophageal reflux disease without esophagitis: Secondary | ICD-10-CM | POA: Diagnosis present

## 2018-03-07 DIAGNOSIS — K5900 Constipation, unspecified: Secondary | ICD-10-CM | POA: Diagnosis present

## 2018-03-07 DIAGNOSIS — D62 Acute posthemorrhagic anemia: Secondary | ICD-10-CM | POA: Diagnosis present

## 2018-03-07 DIAGNOSIS — Z886 Allergy status to analgesic agent status: Secondary | ICD-10-CM | POA: Diagnosis not present

## 2018-03-07 DIAGNOSIS — I119 Hypertensive heart disease without heart failure: Secondary | ICD-10-CM | POA: Diagnosis present

## 2018-03-07 DIAGNOSIS — E785 Hyperlipidemia, unspecified: Secondary | ICD-10-CM | POA: Diagnosis present

## 2018-03-07 DIAGNOSIS — I4819 Other persistent atrial fibrillation: Secondary | ICD-10-CM | POA: Diagnosis present

## 2018-03-07 DIAGNOSIS — I10 Essential (primary) hypertension: Secondary | ICD-10-CM | POA: Diagnosis present

## 2018-03-07 DIAGNOSIS — Z8673 Personal history of transient ischemic attack (TIA), and cerebral infarction without residual deficits: Secondary | ICD-10-CM | POA: Diagnosis not present

## 2018-03-07 DIAGNOSIS — I340 Nonrheumatic mitral (valve) insufficiency: Secondary | ICD-10-CM | POA: Diagnosis present

## 2018-03-07 DIAGNOSIS — Z91013 Allergy to seafood: Secondary | ICD-10-CM

## 2018-03-07 DIAGNOSIS — K5730 Diverticulosis of large intestine without perforation or abscess without bleeding: Secondary | ICD-10-CM | POA: Diagnosis present

## 2018-03-07 LAB — BRAIN NATRIURETIC PEPTIDE: B Natriuretic Peptide: 188.3 pg/mL — ABNORMAL HIGH (ref 0.0–100.0)

## 2018-03-07 LAB — CBC WITH DIFFERENTIAL/PLATELET
BASOS ABS: 0.1 10*3/uL (ref 0.0–0.1)
BASOS PCT: 1 %
EOS ABS: 0.2 10*3/uL (ref 0.0–0.7)
EOS PCT: 2 %
HCT: 28.8 % — ABNORMAL LOW (ref 36.0–46.0)
Hemoglobin: 8.8 g/dL — ABNORMAL LOW (ref 12.0–15.0)
Lymphocytes Relative: 18 %
Lymphs Abs: 1.7 10*3/uL (ref 0.7–4.0)
MCH: 24.2 pg — ABNORMAL LOW (ref 26.0–34.0)
MCHC: 30.6 g/dL (ref 30.0–36.0)
MCV: 79.1 fL (ref 78.0–100.0)
MONO ABS: 1.4 10*3/uL — AB (ref 0.1–1.0)
Monocytes Relative: 15 %
Neutro Abs: 6.1 10*3/uL (ref 1.7–7.7)
Neutrophils Relative %: 64 %
PLATELETS: 301 10*3/uL (ref 150–400)
RBC: 3.64 MIL/uL — ABNORMAL LOW (ref 3.87–5.11)
RDW: 16.1 % — AB (ref 11.5–15.5)
WBC: 9.4 10*3/uL (ref 4.0–10.5)

## 2018-03-07 LAB — URINALYSIS, ROUTINE W REFLEX MICROSCOPIC
BILIRUBIN URINE: NEGATIVE
Glucose, UA: NEGATIVE mg/dL
Hgb urine dipstick: NEGATIVE
Ketones, ur: 15 mg/dL — AB
NITRITE: NEGATIVE
Protein, ur: NEGATIVE mg/dL
SPECIFIC GRAVITY, URINE: 1.015 (ref 1.005–1.030)
pH: 6 (ref 5.0–8.0)

## 2018-03-07 LAB — COMPREHENSIVE METABOLIC PANEL
ALBUMIN: 3.6 g/dL (ref 3.5–5.0)
ALT: 26 U/L (ref 14–54)
AST: 36 U/L (ref 15–41)
Alkaline Phosphatase: 63 U/L (ref 38–126)
Anion gap: 9 (ref 5–15)
BUN: 19 mg/dL (ref 6–20)
CHLORIDE: 103 mmol/L (ref 101–111)
CO2: 23 mmol/L (ref 22–32)
Calcium: 8.6 mg/dL — ABNORMAL LOW (ref 8.9–10.3)
Creatinine, Ser: 1.2 mg/dL — ABNORMAL HIGH (ref 0.44–1.00)
GFR calc Af Amer: 47 mL/min — ABNORMAL LOW (ref >60)
GFR, EST NON AFRICAN AMERICAN: 40 mL/min — AB (ref >60)
Glucose, Bld: 148 mg/dL — ABNORMAL HIGH (ref 65–99)
POTASSIUM: 4 mmol/L (ref 3.5–5.1)
SODIUM: 135 mmol/L (ref 135–145)
Total Bilirubin: 0.5 mg/dL (ref 0.3–1.2)
Total Protein: 6.8 g/dL (ref 6.5–8.1)

## 2018-03-07 LAB — URINALYSIS, MICROSCOPIC (REFLEX)

## 2018-03-07 LAB — TROPONIN I

## 2018-03-07 LAB — HEMOGLOBIN: Hemoglobin: 8.5 g/dL — ABNORMAL LOW (ref 12.0–15.0)

## 2018-03-07 LAB — OCCULT BLOOD X 1 CARD TO LAB, STOOL: Fecal Occult Bld: POSITIVE — AB

## 2018-03-07 MED ORDER — ACETAMINOPHEN 500 MG PO TABS: 500.0000 mg | ORAL_TABLET | Freq: Two times a day (BID) | ORAL | Status: DC | PRN

## 2018-03-07 MED ORDER — ZOLPIDEM TARTRATE 5 MG PO TABS: 5.0000 mg | ORAL_TABLET | Freq: Every evening | ORAL | Status: DC | PRN

## 2018-03-07 MED ORDER — HYDROCODONE-ACETAMINOPHEN 5-325 MG PO TABS
1.0000 | ORAL_TABLET | ORAL | Status: DC | PRN
  Administered 2018-03-08: 1 via ORAL
  Filled 2018-03-07: qty 1

## 2018-03-07 MED ORDER — LORATADINE 10 MG PO TABS
10.0000 mg | ORAL_TABLET | Freq: Every day | ORAL | Status: DC
  Administered 2018-03-07 – 2018-03-10 (×3): 10 mg via ORAL
  Filled 2018-03-07 (×3): qty 1

## 2018-03-07 MED ORDER — ONDANSETRON HCL 4 MG PO TABS: 4.0000 mg | ORAL_TABLET | Freq: Four times a day (QID) | ORAL | Status: DC | PRN

## 2018-03-07 MED ORDER — ACETAMINOPHEN 650 MG RE SUPP: 650.0000 mg | Freq: Four times a day (QID) | RECTAL | Status: DC | PRN

## 2018-03-07 MED ORDER — PANTOPRAZOLE SODIUM 40 MG IV SOLR
INTRAVENOUS | Status: AC
  Administered 2018-03-07: 15:00:00
  Filled 2018-03-07: qty 80

## 2018-03-07 MED ORDER — SODIUM CHLORIDE 0.9 % IV SOLN
80.0000 mg | Freq: Once | INTRAVENOUS | Status: AC
  Administered 2018-03-07: 80 mg via INTRAVENOUS
  Filled 2018-03-07: qty 80

## 2018-03-07 MED ORDER — DIAZEPAM 5 MG PO TABS
5.0000 mg | ORAL_TABLET | Freq: Every day | ORAL | Status: DC | PRN
  Administered 2018-03-07 – 2018-03-09 (×3): 5 mg via ORAL
  Filled 2018-03-07 (×3): qty 1

## 2018-03-07 MED ORDER — HYDRALAZINE HCL 25 MG PO TABS
25.0000 mg | ORAL_TABLET | Freq: Three times a day (TID) | ORAL | Status: DC
  Administered 2018-03-07 – 2018-03-10 (×7): 25 mg via ORAL
  Filled 2018-03-07 (×7): qty 1

## 2018-03-07 MED ORDER — ONDANSETRON HCL 4 MG/2ML IJ SOLN: 4.0000 mg | Freq: Four times a day (QID) | INTRAMUSCULAR | Status: DC | PRN

## 2018-03-07 MED ORDER — ATORVASTATIN CALCIUM 10 MG PO TABS
20.0000 mg | ORAL_TABLET | Freq: Every day | ORAL | Status: DC
  Administered 2018-03-07 – 2018-03-09 (×3): 20 mg via ORAL
  Filled 2018-03-07 (×3): qty 2

## 2018-03-07 MED ORDER — AMIODARONE HCL 200 MG PO TABS
200.0000 mg | ORAL_TABLET | Freq: Every day | ORAL | Status: DC
  Administered 2018-03-08 – 2018-03-10 (×3): 200 mg via ORAL
  Filled 2018-03-07 (×3): qty 1

## 2018-03-07 MED ORDER — DILTIAZEM HCL 60 MG PO TABS
120.0000 mg | ORAL_TABLET | Freq: Every day | ORAL | Status: DC
  Administered 2018-03-08 – 2018-03-10 (×3): 120 mg via ORAL
  Filled 2018-03-07 (×3): qty 2

## 2018-03-07 MED ORDER — ACETAMINOPHEN 325 MG PO TABS
650.0000 mg | ORAL_TABLET | Freq: Four times a day (QID) | ORAL | Status: DC | PRN
  Administered 2018-03-07 – 2018-03-09 (×3): 650 mg via ORAL
  Filled 2018-03-07 (×3): qty 2

## 2018-03-07 MED ORDER — ALBUTEROL SULFATE (2.5 MG/3ML) 0.083% IN NEBU: 5.0000 mg | INHALATION_SOLUTION | Freq: Once | RESPIRATORY_TRACT | Status: DC

## 2018-03-07 MED ORDER — SODIUM CHLORIDE 0.9 % IV SOLN
INTRAVENOUS | Status: DC
  Administered 2018-03-07 – 2018-03-08 (×2): via INTRAVENOUS

## 2018-03-07 NOTE — Progress Notes (Signed)
84 year old lady with prior h/o afib on xarelto, comes in for sob and fatigue, was found to have hemoglobin drop from 13 (oct 2018) to 8 today at [facility].  Stool for occult blood positive and malena.  Vitals wnl.  Request for med surg floor.  Dr Cristina Gong called for consult.  Plan NPO after midnight for EGD in am.    Hosie Poisson, MD [phone]

## 2018-03-07 NOTE — ED Notes (Signed)
Pt. Herself asked [place] to do a BNP she believes she has CHF

## 2018-03-07 NOTE — ED Provider Notes (Addendum)
[facility] EMERGENCY DEPARTMENT Provider Note   CSN: [id number] Arrival date & time: 03/07/18  1324     History   Chief Complaint Chief Complaint  Patient presents with  . Shortness of Breath    HPI Sarah Armstrong is a 84 y.o. female past medical history of A. fib, GERD, hyperlipidemia, hypertension who presents for evaluation of progressively worsening shortness of breath, generalized weakness.  Patient reports that the symptoms have been ongoing for last several weeks.  Patient states that when she is at rest, she does not have any difficulty breathing but states that when she starts exerting herself walking around her house, she states that she has occult he breathing.  Patient is noted that when she is woken up in the morning, she will have to stop and rest several times that she is getting ready.  Patient reports she does sleep with pillows propped up behind her but states that this is been ongoing for the last several years.  It is not new.  Patient states that she does have a history of A. fib but is not in it all the time.  Patient reports that she has not felt any sensations of palpitation or being in A. fib since onset of symptoms.  Patient also reports feeling generalized weakness in her lower extremities.  She reports she has been walking with a cane because she is worried she might fall over.  Patient was recently diagnosed with UTI last week and took 5 days of antibiotics, which she states she finished without any difficulty.  Patient states that she has had some non-productive cough. Patient also reports she has been having some darker stools over the last few days. Patient denies any fevers, vision changes, chest pain, nausea/vomiting, abdominal pain.  The history is provided by the patient.    Past Medical History:  Diagnosis Date  . Atrial fibrillation ([place])   . Cancer ([place]) skin  . GERD (gastroesophageal reflux disease)   . Hyperlipidemia   . Hypertension   .  Stroke [facility])     Patient Active Problem List   Diagnosis Date Noted  . Dyslipidemia 02/24/2018  . Bradycardia 02/24/2018  . Bilateral carotid artery stenosis 02/24/2018  . Essential hypertension 10/07/2017  . Hypotension 10/07/2017  . Non-rheumatic mitral regurgitation 10/07/2017  . Persistent atrial fibrillation ([place])   . Atrial fibrillation ([place]) 02/13/2016    Past Surgical History:  Procedure Laterality Date  . CARDIOVERSION N/A 03/04/2016   Procedure: CARDIOVERSION;  Surgeon: Sueanne Margarita, MD;  Location: [facility];  Service: Cardiovascular;  Laterality: N/A;  . CATARACT EXTRACTION  2015  . COLONOSCOPY  2009  . TONSILLECTOMY  1953  . TOTAL ABDOMINAL HYSTERECTOMY  1983     OB History   None      Home Medications    Prior to Admission medications   Medication Sig Start Date End Date Taking? Authorizing Provider  acetaminophen (TYLENOL) 500 MG tablet Take 500 mg by mouth 2 (two) times daily as needed for mild pain.     [provider]  amiodarone (PACERONE) 200 MG tablet Take 1 tablet (200 mg total) by mouth daily. 03/30/17   Sherran Needs, NP  atorvastatin (LIPITOR) 20 MG tablet Take 1 tablet by mouth daily. 06/26/15   [provider]  cetirizine (ZYRTEC) 10 MG tablet Take 10 mg by mouth daily.    [provider]  Cholecalciferol (VITAMIN D3 SUPER STRENGTH) 2000 units TABS Take 1 tablet by mouth daily.  [provider]  Cranberry 500 MG CAPS Take 500 mg by mouth daily.    [provider]  diazepam (VALIUM) 5 MG tablet Take 5 mg by mouth daily as needed for anxiety.    [provider]  diltiazem (CARDIZEM) 120 MG tablet Take 120 mg by mouth 4 (four) times daily.    [provider]  estradiol (ESTRACE) 0.1 MG/GM vaginal cream Place 1 Applicatorful vaginally 2 (two) times a week. Wed and Sun    [provider]  hydrALAZINE (APRESOLINE) 10 MG tablet Take 10 mg by mouth daily.    [provider]  hydrALAZINE (APRESOLINE) 25 MG tablet Take 1 tablet by mouth 2 (two) times daily.    [provider]  losartan (COZAAR) 50 MG tablet TAKE 1 TABLET DAILY 08/18/17   [provider]  Multiple Vitamins-Minerals (CENTRUM PO) Take 1 tablet by mouth daily. Not taking on Wed and Sun    [provider]  Probiotic Product (PROBIOTIC FORMULA) CAPS Take 1 capsule by mouth as needed.     [provider]  rivaroxaban (XARELTO) 15 MG TABS tablet Take 1 tablet (15 mg total) by mouth daily with supper. 05/04/16   Sherran Needs, NP    Family History Family History  Problem Relation Age of Onset  . Hypertension Mother   . Heart disease Father        CHF  . Stroke Maternal Grandmother   . Cancer Maternal Grandfather   . Heart disease Brother 59  . Leukemia Brother   . Heart disease Brother 64  . COPD Sister   . Parkinson's disease Sister   . Hypertension Child   . Diabetes Child     Social History Social History   Tobacco Use  . Smoking status: Never Smoker  . Smokeless tobacco: Never Used  Substance Use Topics  . Alcohol use: No    Alcohol/week: 0.0 oz  . Drug use: No     Allergies   Aspirin; Other; Strawberry extract; Codeine; Enalapril; Melatonin; Penicillin g; Shellfish allergy; Shellfish-derived products; and Latex   Review of Systems Review of Systems  Constitutional: Negative for chills and fever.  HENT: Negative for congestion.   Eyes: Negative for visual disturbance.  Respiratory: Positive for cough and shortness of breath.   Cardiovascular: Negative for chest pain.  Gastrointestinal: Negative for abdominal pain, diarrhea, nausea and vomiting.  Genitourinary: Negative for dysuria and hematuria.  Musculoskeletal: Negative for back pain and neck pain.  Skin: Negative for rash.  Neurological: Negative for dizziness, weakness, numbness and headaches.  Psychiatric/Behavioral: Negative for confusion.     Physical Exam Updated Vital  Signs BP (!) 195/60 (BP Location: Right Arm)   Pulse 78   Temp 97.8 F (36.6 C) (Oral)   Resp 20   Ht 5\' 7"  (1.702 m)   Wt 77.6 kg (171 lb)   SpO2 97%   BMI 26.78 kg/m   Physical Exam  Constitutional: She is oriented to person, place, and time. She appears well-developed and well-nourished.  HENT:  Head: Normocephalic and atraumatic.  Mouth/Throat: Oropharynx is clear and moist and mucous membranes are normal.  Eyes: Pupils are equal, round, and reactive to light. Conjunctivae, EOM and lids are normal.  Neck: Full passive range of motion without pain.  Cardiovascular: Normal rate, regular rhythm, normal heart sounds and normal pulses. Exam reveals no gallop and no friction rub.  No murmur heard. Pulmonary/Chest: Effort normal and breath sounds normal.  No evidence of  respiratory distress. Able to speak in full sentences without difficulty.  Abdominal: Soft. Normal appearance. There is no tenderness. There is no rigidity and no guarding.  Genitourinary: Rectal exam shows external hemorrhoid and guaiac positive stool.  Genitourinary Comments: The exam was performed with a chaperone present.Small, nonthrombosed external hemorrhoids noted.  Guaiac positive.  Musculoskeletal: Normal range of motion.  Bilateral lower extremities are symmetric in appearance without any pitting edema.  Neurological: She is alert and oriented to person, place, and time.  Skin: Skin is warm and dry. Capillary refill takes less than 2 seconds.  Psychiatric: She has a normal mood and affect. Her speech is normal.  Nursing note and vitals reviewed.    ED Treatments / Results  Labs (all labs ordered are listed, but only abnormal results are displayed) Labs Reviewed  CBC WITH DIFFERENTIAL/PLATELET - Abnormal; Notable for the following components:      Result Value   RBC 3.64 (*)    Hemoglobin 8.8 (*)    HCT 28.8 (*)    MCH 24.2 (*)    RDW 16.1 (*)    Monocytes Absolute 1.4 (*)    All other components  within normal limits  COMPREHENSIVE METABOLIC PANEL - Abnormal; Notable for the following components:   Glucose, Bld 148 (*)    Creatinine, Ser 1.20 (*)    Calcium 8.6 (*)    GFR calc non Af Amer 40 (*)    GFR calc Af Amer 47 (*)    All other components within normal limits  BRAIN NATRIURETIC PEPTIDE - Abnormal; Notable for the following components:   B Natriuretic Peptide 188.3 (*)    All other components within normal limits  OCCULT BLOOD X 1 CARD TO LAB, STOOL - Abnormal; Notable for the following components:   Fecal Occult Bld POSITIVE (*)    All other components within normal limits  URINALYSIS, ROUTINE W REFLEX MICROSCOPIC - Abnormal; Notable for the following components:   APPearance HAZY (*)    Ketones, ur 15 (*)    Leukocytes, UA LARGE (*)    All other components within normal limits  URINALYSIS, MICROSCOPIC (REFLEX) - Abnormal; Notable for the following components:   Bacteria, UA FEW (*)    Squamous Epithelial / LPF 6-30 (*)    All other components within normal limits  TROPONIN I  POC OCCULT BLOOD, ED    EKG EKG Interpretation  Date/Time:  Tuesday March 07 2018 13:34:42 EDT Ventricular Rate:  73 PR Interval:  178 QRS Duration: 92 QT Interval:  456 QTC Calculation: 502 R Axis:   69 Text Interpretation:  Normal sinus rhythm Prolonged QT Abnormal ECG No significant change since last tracing Confirmed by Wandra Arthurs [phone]) on 03/07/2018 3:02:22 PM   Radiology Dg Chest 2 View  Result Date: 03/07/2018 CLINICAL DATA:  Cough, shortness of breath for 2 weeks. History of hypertension, atrial fibrillation. EXAM: CHEST - 2 VIEW COMPARISON:  Chest radiograph March 06, 2016 FINDINGS: Cardiac silhouette is mildly enlarged and unchanged. Mildly calcified aortic knob. Pectus excavatum blurring RIGHT heart border. Faint nodular density RIGHT. LEFT lung base strandy densities and pleural thickening without pleural effusion. No pneumothorax. Soft tissue planes and included osseous  structures are nonsuspicious. IMPRESSION: Faint nodular density projecting RIGHT lung base can reflect sub position of vascular shadows, atelectasis, pneumonia or, external to the lungs .LEFT lung base atelectasis/scarring. Stable cardiomegaly. Aortic Atherosclerosis (ICD10-I70.0). Electronically Signed   By: Elon Alas M.D.   On: 03/07/2018 13:59  Procedures Procedures (including critical care time)  Medications Ordered in ED Medications  albuterol (PROVENTIL) (2.5 MG/3ML) 0.083% nebulizer solution 5 mg (5 mg Nebulization Not Given 03/07/18 1338)  pantoprazole (PROTONIX) 80 mg in sodium chloride 0.9 % 100 mL IVPB (0 mg Intravenous Stopped 03/07/18 1545)  pantoprazole (PROTONIX) 40 MG injection (  Given 03/07/18 1521)     Initial Impression / Assessment and Plan / ED Course  I have reviewed the triage vital signs and the nursing notes.  Pertinent labs & imaging results that were available during my care of the patient were reviewed by me and considered in my medical decision making (see chart for details).     84 year old female with past medical history of A. fib, GERD who presents for evaluation of shortness of breath that is been ongoing for last several weeks.  Reports a nonproductive cough.  No chest pain, difficulty breathing.  Recently diagnosed with UTI and has been taking antibiotics.  Does report she has had some darker stools. Patient is afebrile, non-toxic appearing, sitting comfortably on examination table. Vital signs reviewed and stable.  On exam, lungs are clear to auscultation bilaterally.  No evidence of bilateral lower extremity edema.  Consider acute infectious etiology versus ACS etiology.  Echo done in 2017 shows a normal EF but also consider CHF etiology vs Afib.  Also concern for GI bleed given history of darker stools, patient's history of Xarelto.  Labs reviewed.  Troponin negative.  UA shows large leukocytes but otherwise negative for any other acute infectious  etiology.  CMP shows creatinine at 1.20.  CBC shows hemoglobin is 8.8 and hematocrit is 28.8.  Patient's most recent CBC was on 09/28/17.  At that time, she has a hemoglobin of 13 and hematocrit of 39.7.  Patient denies any history of anemia. Fecal occult was positive. Concern for GI bleed secondary to Xarelto use.   Discussed patient with Dr. Wallis Mart (GI). Agrees with consultation. Recommends 40 mg IV protonix q12 hours. Plan for NPO after midnight with plans to scope in the morning. Hospitalist paged for admission.   4:42 PM: Discussed patient with Dr. Talmadge Coventry (hospitalist). Will admit.   Final Clinical Impressions(s) / ED Diagnoses   Final diagnoses:  Gastrointestinal hemorrhage, unspecified gastrointestinal hemorrhage type  Symptomatic anemia    ED Discharge Orders    None       Volanda Napoleon, PA-C 03/07/18 2106    Volanda Napoleon, PA-C 03/08/18 0132    Drenda Freeze, MD 03/08/18 1510

## 2018-03-07 NOTE — ED Triage Notes (Signed)
Sob x 6 weeks. She was treated for a UTI last week. She is ambulatory. Speaking in complete sentences.

## 2018-03-07 NOTE — H&P (Signed)
[facility]                                                                                    Patient Demographics  Sarah Armstrong, is a 84 y.o. female  CSN: [id number]  MRN: [id number]  DOB - 02-Mar-1931  Admit Date - 03/07/2018  Outpatient Primary MD for the patient is Allie Dimmer, MD   With History of -  Past Medical History:  Diagnosis Date  . Atrial fibrillation ([place])   . Cancer ([place]) skin  . GERD (gastroesophageal reflux disease)   . Hyperlipidemia   . Hypertension   . Stroke [facility])       Past Surgical History:  Procedure Laterality Date  . CARDIOVERSION N/A 03/04/2016   Procedure: CARDIOVERSION;  Surgeon: Sueanne Margarita, MD;  Location: [place];  Service: Cardiovascular;  Laterality: N/A;  . CATARACT EXTRACTION  2015  . COLONOSCOPY  2009  . TONSILLECTOMY  1953  . TOTAL ABDOMINAL HYSTERECTOMY  1983    in for   Chief Complaint  Patient presents with  . Shortness of Breath     HPI  Sarah Armstrong  is a 84 y.o. female, with past medical history significant for atrial fibrillation on Xarelto who presented to [facility] with 1 month history of shortness of breath, dyspnea on exertion and generalized weakness.  Patient noted melena but no frank blood per rectum.  Patient was found to have a hemoglobin of 8 down from 13 on October 2018.  Patient denies abdominal pain, denies any history of fever chills, chest pains, loss of consciousness.  Family at bedside    Review of Systems    In addition to the HPI above,  No Fever-chills, No Headache, No changes with Vision or hearing, No problems swallowing food or Liquids, No Chest pain, Cough  No Abdominal pain, No Nausea or Vommitting, Bowel movements are regular, No Blood in stool or Urine, No dysuria, No new skin rashes or bruises, No new joints pains-aches,  No tingling, numbness in any extremity, No recent weight gain or loss, No polyuria, polydypsia or polyphagia, No significant Mental  Stressors.  A full 10 point Review of Systems was done, except as stated above, all other Review of Systems were negative.   Social History Social History   Tobacco Use  . Smoking status: Never Smoker  . Smokeless tobacco: Never Used  Substance Use Topics  . Alcohol use: No    Alcohol/week: 0.0 oz     Family History Family History  Problem Relation Age of Onset  . Hypertension Mother   . Heart disease Father        CHF  . Stroke Maternal Grandmother   . Cancer Maternal Grandfather   . Heart disease Brother 69  . Leukemia Brother   . Heart disease Brother 53  . COPD Sister   . Parkinson's disease Sister   . Hypertension Child   . Diabetes Child      Prior to Admission medications   Medication Sig Start Date End Date Taking? Authorizing Provider  acetaminophen (TYLENOL) 500 MG tablet Take 500 mg by mouth 2 (two) times daily as needed for mild  pain.     [provider]  amiodarone (PACERONE) 200 MG tablet Take 1 tablet (200 mg total) by mouth daily. 03/30/17   Sherran Needs, NP  atorvastatin (LIPITOR) 20 MG tablet Take 1 tablet by mouth daily. 06/26/15   [provider]  cetirizine (ZYRTEC) 10 MG tablet Take 10 mg by mouth daily.    [provider]  Cholecalciferol (VITAMIN D3 SUPER STRENGTH) 2000 units TABS Take 1 tablet by mouth daily.    [provider]  Cranberry 500 MG CAPS Take 500 mg by mouth daily.    [provider]  diazepam (VALIUM) 5 MG tablet Take 5 mg by mouth daily as needed for anxiety.    [provider]  diltiazem (CARDIZEM) 120 MG tablet Take 120 mg by mouth 1 day or 1 dose.     [provider]  estradiol (ESTRACE) 0.1 MG/GM vaginal cream Place 1 Applicatorful vaginally 2 (two) times a week. Wed and Sun    [provider]  hydrALAZINE (APRESOLINE) 10 MG tablet Take 10 mg by mouth as needed.     [provider]  hydrALAZINE (APRESOLINE) 25 MG tablet Take 1 tablet by mouth 3  (three) times daily.     [provider]  losartan (COZAAR) 50 MG tablet TAKE 1 TABLET DAILY 08/18/17   [provider]  Multiple Vitamins-Minerals (CENTRUM PO) Take 1 tablet by mouth daily. Not taking on Wed and Sun    [provider]  Probiotic Product (PROBIOTIC FORMULA) CAPS Take 1 capsule by mouth as needed.     [provider]  rivaroxaban (XARELTO) 15 MG TABS tablet Take 1 tablet (15 mg total) by mouth daily with supper. 05/04/16   Sherran Needs, NP    Allergies  Allergen Reactions  . Aspirin Anaphylaxis and Swelling    Throat Throat  . Other Anaphylaxis and Swelling    Walnuts, throat   . Strawberry Extract Anaphylaxis  . Codeine Other (See Comments)    hallucinations  . Enalapril Other (See Comments)    Cough, low blood pressure  . Melatonin Nausea Only  . Penicillin G Other (See Comments)    Does not help symptoms  . Shellfish Allergy     Other reaction(s): Other (See Comments)  . Shellfish-Derived Products Other (See Comments)    From allergy testing, IVP dye is fine  . Latex Rash    reddness    Physical Exam  Vitals  Blood pressure (!) 181/62, pulse 74, temperature 98.5 F (36.9 C), temperature source Oral, resp. rate 18, height 5\' 7"  (1.702 m), weight 77.6 kg (171 lb), SpO2 97 %.   1. General no acute distress, lying in bed  2. Normal affect and insight, Not Suicidal or Homicidal, Awake Alert, Oriented X 3.  3. No F.N deficits, patient is moving all extremities.  4. Ears and Eyes appear Normal, Conjunctivae clear, PERRLA. Moist Oral Mucosa.  5. Supple Neck, No JVD, No cervical lymphadenopathy appriciated, .  6. Symmetrical Chest wall movement, Good air movement bilaterally, CTAB.  7.  Irregularly irregular, No Gallops, Rubs or Murmurs, No Parasternal Heave.  8. Positive Bowel Sounds, Abdomen Soft, Non tender, No organomegaly appriciated,No rebound -guarding or rigidity.  9.  No Cyanosis, Normal Skin Turgor, No Skin  Rash or Bruise.  10. Good muscle tone,  joints appear normal , no effusions, Normal ROM.    Data Review  CBC Recent Labs  Lab 03/07/18 1410  WBC 9.4  HGB 8.8*  HCT 28.8*  PLT 301  MCV 79.1  MCH 24.2*  MCHC 30.6  RDW 16.1*  LYMPHSABS 1.7  MONOABS 1.4*  EOSABS 0.2  BASOSABS 0.1   ------------------------------------------------------------------------------------------------------------------  Chemistries  Recent Labs  Lab 03/07/18 1410  NA 135  K 4.0  CL 103  CO2 23  GLUCOSE 148*  BUN 19  CREATININE 1.20*  CALCIUM 8.6*  AST 36  ALT 26  ALKPHOS 63  BILITOT 0.5   ------------------------------------------------------------------------------------------------------------------ estimated creatinine clearance is 37.5 mL/min (A) (by C-G formula based on SCr of 1.2 mg/dL (H)). ------------------------------------------------------------------------------------------------------------------ No results for input(s): TSH, T4TOTAL, T3FREE, THYROIDAB in the last 72 hours.  Invalid input(s): FREET3   Coagulation profile No results for input(s): INR, PROTIME in the last 168 hours. ------------------------------------------------------------------------------------------------------------------- No results for input(s): DDIMER in the last 72 hours. -------------------------------------------------------------------------------------------------------------------  Cardiac Enzymes Recent Labs  Lab 03/07/18 1410  TROPONINI <0.03   ------------------------------------------------------------------------------------------------------------------ Invalid input(s): POCBNP   ---------------------------------------------------------------------------------------------------------------  Urinalysis    Component Value Date/Time   COLORURINE YELLOW 03/07/2018 1420   APPEARANCEUR HAZY (A) 03/07/2018 1420   LABSPEC 1.015 03/07/2018 1420   PHURINE 6.0 03/07/2018 1420    GLUCOSEU NEGATIVE 03/07/2018 1420   HGBUR NEGATIVE 03/07/2018 1420   BILIRUBINUR NEGATIVE 03/07/2018 1420   KETONESUR 15 (A) 03/07/2018 1420   PROTEINUR NEGATIVE 03/07/2018 1420   NITRITE NEGATIVE 03/07/2018 1420   LEUKOCYTESUR LARGE (A) 03/07/2018 1420    ----------------------------------------------------------------------------------------------------------------   Imaging results:   Dg Chest 2 View  Result Date: 03/07/2018 CLINICAL DATA:  Cough, shortness of breath for 2 weeks. History of hypertension, atrial fibrillation. EXAM: CHEST - 2 VIEW COMPARISON:  Chest radiograph March 06, 2016 FINDINGS: Cardiac silhouette is mildly enlarged and unchanged. Mildly calcified aortic knob. Pectus excavatum blurring RIGHT heart border. Faint nodular density RIGHT. LEFT lung base strandy densities and pleural thickening without pleural effusion. No pneumothorax. Soft tissue planes and included osseous structures are nonsuspicious. IMPRESSION: Faint nodular density projecting RIGHT lung base can reflect sub position of vascular shadows, atelectasis, pneumonia or, external to the lungs .LEFT lung base atelectasis/scarring. Stable cardiomegaly. Aortic Atherosclerosis (ICD10-I70.0). Electronically Signed   By: Elon Alas M.D.   On: 03/07/2018 13:59    My personal review of EKG: Normal sinus rhythm, 73 bpm, no acute changes, prolonged QT  Assessment & Plan  1.  GI bleed 2.  History of A. fib on Xarelto 3.  Anemia 4.  Hypertension  Plan  Discussed with Dr. Cristina Gong by accepting physician N.p.o. after midnight for endoscopy in a.m. Hold Xarelto Hold losartan and place on as needed hydralazine.   DVT Prophylaxis SCDs  AM Labs Ordered, also please review Full Orders  Family Communication: Admission, patients condition and plan of care including tests being ordered have been discussed with the patient and daughters who indicate understanding and agree with the plan and Code Status.  Code  Status full  Disposition Plan: Home  Time spent in minutes : 42 minutes  Condition GUARDED   @SIGNATURE @

## 2018-03-08 ENCOUNTER — Encounter (HOSPITAL_COMMUNITY): Payer: Self-pay | Admitting: *Deleted

## 2018-03-08 ENCOUNTER — Encounter (HOSPITAL_COMMUNITY)
Admission: EM | Disposition: A | Discharge status: Home / Self Care | Payer: Self-pay | Source: Home / Self Care | Attending: Internal Medicine

## 2018-03-08 ENCOUNTER — Inpatient Hospital Stay (HOSPITAL_COMMUNITY): Payer: Medicare Other | Admitting: Anesthesiology

## 2018-03-08 DIAGNOSIS — D62 Acute posthemorrhagic anemia: Secondary | ICD-10-CM

## 2018-03-08 DIAGNOSIS — I481 Persistent atrial fibrillation: Secondary | ICD-10-CM

## 2018-03-08 DIAGNOSIS — D649 Anemia, unspecified: Secondary | ICD-10-CM

## 2018-03-08 DIAGNOSIS — K922 Gastrointestinal hemorrhage, unspecified: Secondary | ICD-10-CM

## 2018-03-08 DIAGNOSIS — N179 Acute kidney failure, unspecified: Secondary | ICD-10-CM

## 2018-03-08 DIAGNOSIS — I10 Essential (primary) hypertension: Secondary | ICD-10-CM

## 2018-03-08 HISTORY — PX: ESOPHAGOGASTRODUODENOSCOPY (EGD) WITH PROPOFOL: SHX5813

## 2018-03-08 LAB — IRON AND TIBC
Iron: 30 ug/dL (ref 28–170)
Saturation Ratios: 6 % — ABNORMAL LOW (ref 10.4–31.8)
TIBC: 484 ug/dL — ABNORMAL HIGH (ref 250–450)
UIBC: 454 ug/dL

## 2018-03-08 LAB — RETICULOCYTES
RBC.: 3.68 MIL/uL — AB (ref 3.87–5.11)
RETIC CT PCT: 1.4 % (ref 0.4–3.1)
Retic Count, Absolute: 51.5 10*3/uL (ref 19.0–186.0)

## 2018-03-08 LAB — CBC
HCT: 24.8 % — ABNORMAL LOW (ref 36.0–46.0)
Hemoglobin: 7.6 g/dL — ABNORMAL LOW (ref 12.0–15.0)
MCH: 24.4 pg — AB (ref 26.0–34.0)
MCHC: 30.6 g/dL (ref 30.0–36.0)
MCV: 79.5 fL (ref 78.0–100.0)
PLATELETS: 284 10*3/uL (ref 150–400)
RBC: 3.12 MIL/uL — AB (ref 3.87–5.11)
RDW: 16.1 % — AB (ref 11.5–15.5)
WBC: 8.3 10*3/uL (ref 4.0–10.5)

## 2018-03-08 LAB — PREPARE RBC (CROSSMATCH)

## 2018-03-08 LAB — PROTIME-INR
INR: 1.08
PROTHROMBIN TIME: 13.9 s (ref 11.4–15.2)

## 2018-03-08 LAB — HEMOGLOBIN AND HEMATOCRIT, BLOOD
HCT: 30.7 % — ABNORMAL LOW (ref 36.0–46.0)
Hemoglobin: 9.3 g/dL — ABNORMAL LOW (ref 12.0–15.0)

## 2018-03-08 LAB — HEMOGLOBIN: HEMOGLOBIN: 9.2 g/dL — AB (ref 12.0–15.0)

## 2018-03-08 LAB — ABO/RH: ABO/RH(D): O POS

## 2018-03-08 LAB — FERRITIN: FERRITIN: 7 ng/mL — AB (ref 11–307)

## 2018-03-08 SURGERY — ESOPHAGOGASTRODUODENOSCOPY (EGD) WITH PROPOFOL
Anesthesia: Monitor Anesthesia Care

## 2018-03-08 MED ORDER — PROPOFOL 10 MG/ML IV BOLUS
INTRAVENOUS | Status: AC
  Filled 2018-03-08: qty 40

## 2018-03-08 MED ORDER — PROPOFOL 500 MG/50ML IV EMUL
INTRAVENOUS | Status: DC | PRN
  Administered 2018-03-08: 125 ug/kg/min via INTRAVENOUS

## 2018-03-08 MED ORDER — LINACLOTIDE 145 MCG PO CAPS
290.0000 ug | ORAL_CAPSULE | Freq: Once | ORAL | Status: AC
  Administered 2018-03-08: 290 ug via ORAL
  Filled 2018-03-08: qty 2

## 2018-03-08 MED ORDER — PEG 3350-KCL-NA BICARB-NACL 420 G PO SOLR
4000.0000 mL | Freq: Once | ORAL | Status: AC
  Administered 2018-03-08: 4000 mL via ORAL

## 2018-03-08 MED ORDER — PROPOFOL 10 MG/ML IV BOLUS
INTRAVENOUS | Status: DC | PRN
  Administered 2018-03-08: 20 mg via INTRAVENOUS

## 2018-03-08 MED ORDER — HYDROCORTISONE 1 % EX CREA
TOPICAL_CREAM | Freq: Four times a day (QID) | CUTANEOUS | Status: DC
  Administered 2018-03-09: 14:00:00 via TOPICAL
  Filled 2018-03-08: qty 28

## 2018-03-08 MED ORDER — HYDRALAZINE HCL 20 MG/ML IJ SOLN
5.0000 mg | Freq: Four times a day (QID) | INTRAMUSCULAR | Status: DC | PRN
  Administered 2018-03-09: 5 mg via INTRAVENOUS
  Filled 2018-03-08: qty 1

## 2018-03-08 MED ORDER — LACTATED RINGERS IV SOLN
INTRAVENOUS | Status: AC | PRN
  Administered 2018-03-08: 1000 mL via INTRAVENOUS

## 2018-03-08 MED ORDER — SODIUM CHLORIDE 0.9 % IV SOLN: INTRAVENOUS | Status: DC

## 2018-03-08 MED ORDER — LIDOCAINE 2% (20 MG/ML) 5 ML SYRINGE
INTRAMUSCULAR | Status: DC | PRN
  Administered 2018-03-08: 100 mg via INTRAVENOUS

## 2018-03-08 MED ORDER — SODIUM CHLORIDE 0.9 % IV SOLN: Freq: Once | INTRAVENOUS | Status: DC

## 2018-03-08 SURGICAL SUPPLY — 14 items

## 2018-03-08 NOTE — Transfer of Care (Signed)
Immediate Anesthesia Transfer of Care Note  Patient: Sarah Armstrong  Procedure(s) Performed: ESOPHAGOGASTRODUODENOSCOPY (EGD) WITH PROPOFOL (N/A )  Patient Location: PACU and Endoscopy Unit  Anesthesia Type:MAC  Level of Consciousness: awake and patient cooperative  Airway & Oxygen Therapy: Patient Spontanous Breathing and Patient connected to nasal cannula oxygen  Post-op Assessment: Report given to RN and Post -op Vital signs reviewed and stable  Post vital signs: Reviewed and stable  Last Vitals:  Vitals Value Taken Time  BP    Temp    Pulse 69 03/08/2018  2:33 PM  Resp 20 03/08/2018  2:33 PM  SpO2 98 % 03/08/2018  2:33 PM  Vitals shown include unvalidated device data.  Last Pain:  Vitals:   03/08/18 1331  TempSrc: Oral  PainSc: 0-No pain         Complications: No apparent anesthesia complications

## 2018-03-08 NOTE — Anesthesia Preprocedure Evaluation (Addendum)
Anesthesia Evaluation  Patient identified by MRN, date of birth, ID band Patient awake    Reviewed: Allergy & Precautions, NPO status , Patient's Chart, lab work & pertinent test results  Airway Mallampati: II  TM Distance: >3 FB Neck ROM: Full    Dental no notable dental hx.    Pulmonary neg pulmonary ROS,    Pulmonary exam normal breath sounds clear to auscultation       Cardiovascular hypertension, Normal cardiovascular exam Rhythm:Regular Rate:Normal     Neuro/Psych CVA negative psych ROS   GI/Hepatic negative GI ROS, Neg liver ROS,   Endo/Other  negative endocrine ROS  Renal/GU negative Renal ROS  negative genitourinary   Musculoskeletal negative musculoskeletal ROS (+)   Abdominal   Peds negative pediatric ROS (+)  Hematology  (+) Blood dyscrasia, anemia , On xarelto   Anesthesia Other Findings   Reproductive/Obstetrics negative OB ROS                            Anesthesia Physical Anesthesia Plan  ASA: III  Anesthesia Plan: MAC   Post-op Pain Management:    Induction: Intravenous  PONV Risk Score and Plan: 0  Airway Management Planned: Nasal Cannula  Additional Equipment:   Intra-op Plan:   Post-operative Plan:   Informed Consent: I have reviewed the patients History and Physical, chart, labs and discussed the procedure including the risks, benefits and alternatives for the proposed anesthesia with the patient or authorized representative who has indicated his/her understanding and acceptance.   Dental advisory given  Plan Discussed with: CRNA and Surgeon  Anesthesia Plan Comments:         Anesthesia Quick Evaluation

## 2018-03-08 NOTE — Consult Note (Signed)
Referring Provider:   Dr. Sandi Mariscal Primary Care Physician:  Allie Dimmer, MD Primary Gastroenterologist:  Dr. Lorenza Burton [facility] [place])  Reason for Consultation:  Heme positive stool and anemia  HPI: Sarah Armstrong is a 84 y.o. female on Xarelto for the past couple of years because of atrial fibrillation, no prior history of ulcer disease or GI bleeding, presented with a several week history of shortness of breath and weakness and was found to have significant microcytic anemia, with a presenting hemoglobin of 8.8 (MCV 79) which has fallen overnight to 7.6 following hydration.  Her stool was heme positive but reportedly non-melenic, and the patient in recent weeks has possibly some slightly dark stools but not frank melena.    She has a history of occasional nocturnal reflux symptoms, for which she uses Zantac and occasional Tums or Mylanta, but no other localizing GI tract symptoms.  Not on any ulcerogenic medication such as aspirin or nonsteroidals.    Did have endoscopy about 2 years ago in [facility] following her husband's death when she had nausea and significant weight loss, possibly related to the use of melatonin; at that time, she was found to have a sliding hiatal hernia but apparently no other significant abnormalities, by her report.  The patient also indicates that she had colonoscopy in [facility] in 2009, at which time two small benign polyps were removed, and further routine surveillance examinations were not felt to be required based on her age.   Past Medical History:  Diagnosis Date  . Atrial fibrillation ([place])   . Cancer ([place]) skin  . GERD (gastroesophageal reflux disease)   . Hyperlipidemia   . Hypertension   . Stroke [facility])     Past Surgical History:  Procedure Laterality Date  . CARDIOVERSION N/A 03/04/2016   Procedure: CARDIOVERSION;  Surgeon: Sueanne Margarita, MD;  Location: [place];  Service: Cardiovascular;  Laterality: N/A;  . CATARACT EXTRACTION   2015  . COLONOSCOPY  2009  . TONSILLECTOMY  1953  . TOTAL ABDOMINAL HYSTERECTOMY  1983    Prior to Admission medications   Medication Sig Start Date End Date Taking? Authorizing Provider  acetaminophen (TYLENOL) 500 MG tablet Take 500 mg by mouth 2 (two) times daily as needed for mild pain.    Yes [provider]  amiodarone (PACERONE) 200 MG tablet Take 1 tablet (200 mg total) by mouth daily. 03/30/17  Yes Sherran Needs, NP  atorvastatin (LIPITOR) 20 MG tablet Take 1 tablet by mouth daily. 06/26/15  Yes [provider]  cetirizine (ZYRTEC) 10 MG tablet Take 10 mg by mouth daily.   Yes [provider]  Cholecalciferol (VITAMIN D3 SUPER STRENGTH) 2000 units TABS Take 1 tablet by mouth daily.   Yes [provider]  diltiazem (CARDIZEM) 120 MG tablet Take 120 mg by mouth 1 day or 1 dose.    Yes [provider]  estradiol (ESTRACE) 0.1 MG/GM vaginal cream Place 1 Applicatorful vaginally 2 (two) times a week. Wed and Sun   Yes [provider]  hydrALAZINE (APRESOLINE) 25 MG tablet Take 1 tablet by mouth 3 (three) times daily.    Yes [provider]  losartan (COZAAR) 50 MG tablet TAKE 1 TABLET PO DAILY 08/18/17  Yes [provider]  Multiple Vitamins-Minerals (CENTRUM PO) Take 1 tablet by mouth daily. Not taking on Wed and Sun   Yes [provider]  ranitidine (ZANTAC) 150 MG tablet Take 150 mg by mouth every  evening.   Yes [provider]  rivaroxaban (XARELTO) 15 MG TABS tablet Take 1 tablet (15 mg total) by mouth daily with supper. 05/04/16  Yes Sherran Needs, NP  diazepam (VALIUM) 5 MG tablet Take 5 mg by mouth daily as needed for anxiety.    [provider]  hydrALAZINE (APRESOLINE) 10 MG tablet Take 10 mg by mouth 3 (three) times daily as needed (high blood pressure).     [provider]  Probiotic Product (PROBIOTIC FORMULA) CAPS Take 1 capsule by mouth daily as needed (prevent  infection).     [provider]    Current Facility-Administered Medications  Medication Dose Route Frequency Provider Last Rate Last Dose  . 0.9 %  sodium chloride infusion   Intravenous Continuous Merton Border, MD 50 mL/hr at 03/07/18 2042    . 0.9 %  sodium chloride infusion   Intravenous Once Aileen Fass, Tammi Klippel, MD      . acetaminophen (TYLENOL) tablet 650 mg  650 mg Oral Q6H PRN Merton Border, MD   650 mg at 03/07/18 2041   Or  . acetaminophen (TYLENOL) suppository 650 mg  650 mg Rectal Q6H PRN Merton Border, MD      . albuterol (PROVENTIL) (2.5 MG/3ML) 0.083% nebulizer solution 5 mg  5 mg Nebulization Once Merton Border, MD      . amiodarone (PACERONE) tablet 200 mg  200 mg Oral Daily Merton Border, MD      . atorvastatin (LIPITOR) tablet 20 mg  20 mg Oral QHS Merton Border, MD   20 mg at 03/07/18 2135  . diazepam (VALIUM) tablet 5 mg  5 mg Oral Daily PRN Merton Border, MD   5 mg at 03/07/18 2135  . diltiazem (CARDIZEM) tablet 120 mg  120 mg Oral Daily Merton Border, MD      . hydrALAZINE (APRESOLINE) injection 5 mg  5 mg Intravenous Q6H PRN Merton Border, MD      . hydrALAZINE (APRESOLINE) tablet 25 mg  25 mg Oral TID Merton Border, MD   25 mg at 03/07/18 2135  . HYDROcodone-acetaminophen (NORCO/VICODIN) 5-325 MG per tablet 1-2 tablet  1-2 tablet Oral Q4H PRN Merton Border, MD      . loratadine (CLARITIN) tablet 10 mg  10 mg Oral Daily Merton Border, MD   10 mg at 03/07/18 2135  . ondansetron (ZOFRAN) tablet 4 mg  4 mg Oral Q6H PRN Merton Border, MD       Or  . ondansetron (ZOFRAN) injection 4 mg  4 mg Intravenous Q6H PRN Merton Border, MD      . zolpidem (AMBIEN) tablet 5 mg  5 mg Oral QHS PRN Merton Border, MD        Allergies as of 03/07/2018 - Review Complete 03/07/2018  Allergen Reaction Noted  . Aspirin Anaphylaxis and Swelling 11/25/2009  . Other Anaphylaxis and Swelling 02/13/2016  . Strawberry extract Anaphylaxis 02/13/2016  . Codeine Other (See Comments) 02/13/2016  . Enalapril Other  (See Comments) 03/03/2016  . Melatonin Nausea Only 11/12/2016  . Penicillin g Other (See Comments) 02/13/2016  . Shellfish allergy  11/25/2009  . Shellfish-derived products Other (See Comments) 02/13/2016  . Latex Rash 09/09/2016    Family History  Problem Relation Age of Onset  . Hypertension Mother   . Heart disease Father        CHF  . Stroke Maternal Grandmother   . Cancer Maternal Grandfather   . Heart disease Brother 50  . Leukemia Brother   .  Heart disease Brother 68  . COPD Sister   . Parkinson's disease Sister   . Hypertension Child   . Diabetes Child     Social History   Socioeconomic History  . Marital status: Unknown    Spouse name: Not on file  . Number of children: 2  . Years of education: Not on file  . Highest education level: Not on file  Occupational History  . Not on file  Social Needs  . Financial resource strain: Not on file  . Food insecurity:    Worry: Not on file    Inability: Not on file  . Transportation needs:    Medical: Not on file    Non-medical: Not on file  Tobacco Use  . Smoking status: Never Smoker  . Smokeless tobacco: Never Used  Substance and Sexual Activity  . Alcohol use: No    Alcohol/week: 0.0 oz  . Drug use: No  . Sexual activity: Not on file  Lifestyle  . Physical activity:    Days per week: Not on file    Minutes per session: Not on file  . Stress: Not on file  Relationships  . Social connections:    Talks on phone: Not on file    Gets together: Not on file    Attends religious service: Not on file    Active member of club or organization: Not on file    Attends meetings of clubs or organizations: Not on file    Relationship status: Not on file  . Intimate partner violence:    Fear of current or ex partner: Not on file    Emotionally abused: Not on file    Physically abused: Not on file    Forced sexual activity: Not on file  Other Topics Concern  . Not on file  Social History Narrative   Lives alone.   Husband died Mar 07, 2016    Review of Systems: Comfortable in bed, no abdominal pain, no chest pain or shortness of breath  Physical Exam: Vital signs in last 24 hours: Temp:  [97.8 F (36.6 C)-98.5 F (36.9 C)] 98.1 F (36.7 C) (04/10 0422) Pulse Rate:  [60-78] 69 (04/10 0738) Resp:  [15-22] 19 (04/10 0422) BP: (138-195)/(52-62) 160/57 (04/10 0738) SpO2:  1988/03/07 %-97 %] 97 % (04/10 0422) Weight:  [77.6 kg (171 lb)] 77.6 kg (171 lb) (04/09 1329) Last BM Date: 03/06/18 General:   Alert,  Well-developed, well-nourished, pleasant and cooperative in NAD, looks younger than her age, somewhat pale but not at all shocky in appearance Head:  Normocephalic and atraumatic. Eyes:  Sclera clear, no icterus.   Conjunctiva pink despite anemia.. Mouth:   No ulcerations or lesions.  Oropharynx pink & moist. Neck:   No masses or thyromegaly. Lungs: Diminished breath sounds but no wheezes heard Heart:   Regular rate and rhythm; no murmurs, clicks, rubs,  or gallops. Abdomen:  Soft, nontender, mildly tympanitic, and nondistended. No masses, hepatosplenomegaly or ventral hernias noted.  Msk:   Symmetrical without gross deformities. Extremities:   Without clubbing, cyanosis, or edema. Neurologic:  Alert and coherent;  grossly normal neurologically. Skin:  Intact without significant lesions or rashes. Cervical Nodes:  No significant cervical adenopathy. Psych:   Alert and cooperative. Normal mood and affect.  Intake/Output from previous day: No intake/output data recorded. Intake/Output this shift: No intake/output data recorded.  Lab Results: Recent Labs    03/07/18 1410 03/07/18 2010-03-07 03/08/18 0413  WBC 9.4  --  8.3  HGB 8.8*  8.5* 7.6*  HCT 28.8*  --  24.8*  PLT 301  --  284   BMET Recent Labs    03/07/18 1410  NA 135  K 4.0  CL 103  CO2 23  GLUCOSE 148*  BUN 19  CREATININE 1.20*  CALCIUM 8.6*   LFT Recent Labs    03/07/18 1410  PROT 6.8  ALBUMIN 3.6  AST 36  ALT 26  ALKPHOS 63   BILITOT 0.5   PT/INR Recent Labs    03/08/18 0413  LABPROT 13.9  INR 1.08    Studies/Results: Dg Chest 2 View  Result Date: 03/07/2018 CLINICAL DATA:  Cough, shortness of breath for 2 weeks. History of hypertension, atrial fibrillation. EXAM: CHEST - 2 VIEW COMPARISON:  Chest radiograph March 06, 2016 FINDINGS: Cardiac silhouette is mildly enlarged and unchanged. Mildly calcified aortic knob. Pectus excavatum blurring RIGHT heart border. Faint nodular density RIGHT. LEFT lung base strandy densities and pleural thickening without pleural effusion. No pneumothorax. Soft tissue planes and included osseous structures are nonsuspicious. IMPRESSION: Faint nodular density projecting RIGHT lung base can reflect sub position of vascular shadows, atelectasis, pneumonia or, external to the lungs .LEFT lung base atelectasis/scarring. Stable cardiomegaly. Aortic Atherosclerosis (ICD10-I70.0). Electronically Signed   By: Elon Alas M.D.   On: 03/07/2018 13:59    Impression: 1.  Chronic progressive severe anemia characterized by symptoms of weakness and shortness of breath with microcytic indices 2.  Chronic anticoagulation for atrial fibrillation (Xarelto) 3.  Heme positive stool  Plan: I discussed various options with the patient and her daughter, [name], at the bedside.  I feel the best course of action would be to perform upper endoscopy later today, after she receives her transfusion so as to optimize stability prior to the procedure.  This procedure would be relatively safe and easy for the patient.  I have reviewed the risks with the patient and her daughter and they are agreeable to proceed.  If the endoscopy is unrevealing for source of the patient's heme positivity and anemia, we will then have to consider whether to proceed to colonoscopy, and perhaps even a capsule endoscopy, at a later time to complete her GI work-up.  I do feel the patient is fit to undergo colonoscopy, should that be  necessary.  It would then be a question of timing, whether to do it while she is still in the hospital so we have the benefit of that information prior to resumption of anticoagulation, or whether to try to further optimize the patient's status by allowing her hemoglobin to "hopefully" rise over the next several weeks with iron supplementation.   LOS: 1 day   [place] V  03/08/2018, 9:52 AM   Pager [phone] If no answer or after 5 PM call [phone]

## 2018-03-08 NOTE — Op Note (Signed)
[facility] Patient Name: Sarah Armstrong Procedure Date: 03/08/2018 MRN: [id number] Attending MD: Ronald Lobo , MD Date of Birth: 11-Jun-1931 CSN: [id number] Age: 84 Admit Type: Inpatient Procedure:                Upper GI endoscopy Indications:              Iron deficiency anemia secondary to chronic blood                            loss--6 gm drop in hgb over the past 6 mos, on                            Xarelto, progressive dyspnea/weakness over several                            weeks, heme positive, no clinically overt bleeding Providers:                Ronald Lobo, MD, Cleda Daub, RN, Cherylynn Ridges, Technician, Dione Booze, CRNA Referring MD:              Medicines:                Monitored Anesthesia Care Complications:            No immediate complications. Estimated Blood Loss:     Estimated blood loss was minimal. Procedure:                Pre-Anesthesia Assessment:                           - Prior to the procedure, a History and Physical                            was performed, and patient medications and                            allergies were reviewed. The patient's tolerance of                            previous anesthesia was also reviewed. The risks                            and benefits of the procedure and the sedation                            options and risks were discussed with the patient.                            All questions were answered, and informed consent                            was obtained. Prior Anticoagulants: The patient has  taken Xarelto (rivaroxaban), last dose was 2 days                            prior to procedure. ASA Grade Assessment: III - A                            patient with severe systemic disease. After                            reviewing the risks and benefits, the patient was                            deemed in satisfactory condition to  undergo the                            procedure.                           After obtaining informed consent, the endoscope was                            passed under direct vision. Throughout the                            procedure, the patient's blood pressure, pulse, and                            oxygen saturations were monitored continuously. The                            [id number]) scope was introduced through the                            mouth, and advanced to the second part of duodenum.                            The upper GI endoscopy was accomplished without                            difficulty. The patient tolerated the procedure                            well. Scope In: Scope Out: Findings:      The larynx was normal.      Multiple benign-appearing, stacked rings were found in the lower half of       the esophagus. These stenoses were traversed without any difficulty or       resistance.      A small hiatal hernia was present.      A widely patent and non-obstructing Schatzki ring was found at the       gastroesophageal junction.      The exam of the esophagus was otherwise normal.      Striped mildly erythematous mucosa without bleeding was found in the       gastric body and in the  gastric antrum.      No other significant abnormalities were identified in a careful       examination of the stomach.      The cardia and gastric fundus were normal on retroflexion.      The examined duodenum was normal. Biopsies were taken with a cold       forceps for histology. Impression:               - No source of anemia endoscopically evident.                           - Normal larynx.                           - Benign-appearing esophageal stenoses, which, by                            history, are essentially asymptomatic.                           - Small hiatal hernia.                           - Widely patent and non-obstructing Schatzki ring.                            - Erythematous mucosa in the gastric body and                            antrum, without erosive changes.                           - Normal examined duodenum. Biopsied to confirm                            absence of celiac disease as cause for patient's                            anemia. Moderate Sedation:      This patient was sedated with monitored anesthesia care, not moderate       sedation. Recommendation:           - Await pathology results.                           - Continue present medications.                           - Consider colonoscopic evaluation. Procedure Code(s):        --- Professional ---                           [phone], Esophagogastroduodenoscopy, flexible,                            transoral; with biopsy, single or multiple Diagnosis Code(s):        --- Professional ---  K22.2, Esophageal obstruction                           K31.89, Other diseases of stomach and duodenum                           D50.0, Iron deficiency anemia secondary to blood                            loss (chronic) CPT copyright 2017 American Medical Association. All rights reserved. The codes documented in this report are preliminary and upon coder review may  be revised to meet current compliance requirements. Ronald Lobo, MD 03/08/2018 2:46:24 PM This report has been signed electronically. Number of Addenda: 0

## 2018-03-08 NOTE — Progress Notes (Signed)
[facility] PROGRESS NOTE    Progress Note  Sarah Armstrong  DGU:[id number] DOB: 05-22-1931 DOA: 03/07/2018 PCP: Allie Dimmer, MD     Brief Narrative:   Brittnei Jagiello is an 84 y.o. female past medical history significant for atrial fibrillation on Xarelto presents with one-month history of shortness of breath, was found to have a hemoglobin of 8.5 in the ED.  Assessment/Plan:   Acute blood loss anemia/ GI bleed: Awaiting GI recommendations. Keep n.p.o. for possible endoscopy this morning. Continue to hold Xarelto.  His hemoglobin continues to drop we will transfuse 1 unit of packed red blood cells check a CBC post transfusional. Patient is currently symptomatic feels weak still mildly short of breath. IMA globin continues to drift down we will transfuse 1 unit of packed red blood cell check a CBC post transfusional.  Persistent atrial fibrillation (HCC) Rate control continue amiodarone.  Essential hypertension Mildly elevated in the setting of acute blood loss anemia and acute renal failure, will continue to hold antihypertensive medication. Continue IV fluid hydration.  AKI (acute kidney injury) ([place]) Likely prerenal in etiology now planning at baseline continue IV fluids.    DVT prophylaxis: scd Family Communication:daughter Disposition Plan/Barrier to D/C: home once Hbg stable. Code Status:     Code Status Orders  (From admission, onward)        Start     Ordered   03/07/18 1950  Full code  Continuous     03/07/18 1949    Code Status History    This patient has a current code status but no historical code status.    Advance Directive Documentation     Most Recent Value  Type of Advance Directive  Healthcare Power of Attorney, Living will  Pre-existing out of facility DNR order (yellow form or pink MOST form)  -  "MOST" Form in Place?  -        IV Access:    Peripheral IV   Procedures and diagnostic studies:   Dg Chest 2 View  Result Date:  03/07/2018 CLINICAL DATA:  Cough, shortness of breath for 2 weeks. History of hypertension, atrial fibrillation. EXAM: CHEST - 2 VIEW COMPARISON:  Chest radiograph March 06, 2016 FINDINGS: Cardiac silhouette is mildly enlarged and unchanged. Mildly calcified aortic knob. Pectus excavatum blurring RIGHT heart border. Faint nodular density RIGHT. LEFT lung base strandy densities and pleural thickening without pleural effusion. No pneumothorax. Soft tissue planes and included osseous structures are nonsuspicious. IMPRESSION: Faint nodular density projecting RIGHT lung base can reflect sub position of vascular shadows, atelectasis, pneumonia or, external to the lungs .LEFT lung base atelectasis/scarring. Stable cardiomegaly. Aortic Atherosclerosis (ICD10-I70.0). Electronically Signed   By: Elon Alas M.D.   On: 03/07/2018 13:59     Medical Consultants:    None.  Anti-Infectives:   None  Subjective:    [name] she feels weak and tired.  Objective:    Vitals:   03/07/18 1800 03/07/18 1936 03/08/18 0422 03/08/18 0738  BP: (!) 161/52 (!) 181/62 (!) 174/53 (!) 160/57  Pulse: 65 74 73 69  Resp: 15 18 19    Temp:  98.5 F (36.9 C) 98.1 F (36.7 C)   TempSrc:  Oral Oral   SpO2: 96% 97% 97%   Weight:      Height:       No intake or output data in the 24 hours ending 03/08/18 0833 Filed Weights   03/07/18 1329  Weight: 77.6 kg (171 lb)    Exam: General exam:  In no acute distress pale looking Respiratory system: Good air movement and clear to auscultation. Cardiovascular system: S1 & S2 heard, RRR.  Gastrointestinal system: Abdomen is nondistended, soft and nontender.  Central nervous system: Alert and oriented. No focal neurological deficits. Extremities: No pedal edema. Skin: No rashes, lesions or ulcers Psychiatry: Judgement and insight appear normal. Mood & affect appropriate.    Data Reviewed:    Labs: Basic Metabolic Panel: Recent Labs  Lab 03/07/18 1410  NA  135  K 4.0  CL 103  CO2 23  GLUCOSE 148*  BUN 19  CREATININE 1.20*  CALCIUM 8.6*   GFR Estimated Creatinine Clearance: 37.5 mL/min (A) (by C-G formula based on SCr of 1.2 mg/dL (H)). Liver Function Tests: Recent Labs  Lab 03/07/18 1410  AST 36  ALT 26  ALKPHOS 63  BILITOT 0.5  PROT 6.8  ALBUMIN 3.6   No results for input(s): LIPASE, AMYLASE in the last 168 hours. No results for input(s): AMMONIA in the last 168 hours. Coagulation profile Recent Labs  Lab 03/08/18 0413  INR 1.08    CBC: Recent Labs  Lab 03/07/18 1410 03/07/18 2011 03/08/18 0413  WBC 9.4  --  8.3  NEUTROABS 6.1  --   --   HGB 8.8* 8.5* 7.6*  HCT 28.8*  --  24.8*  MCV 79.1  --  79.5  PLT 301  --  284   Cardiac Enzymes: Recent Labs  Lab 03/07/18 1410  TROPONINI <0.03   BNP (last 3 results) No results for input(s): PROBNP in the last 8760 hours. CBG: No results for input(s): GLUCAP in the last 168 hours. D-Dimer: No results for input(s): DDIMER in the last 72 hours. Hgb A1c: No results for input(s): HGBA1C in the last 72 hours. Lipid Profile: No results for input(s): CHOL, HDL, LDLCALC, TRIG, CHOLHDL, LDLDIRECT in the last 72 hours. Thyroid function studies: No results for input(s): TSH, T4TOTAL, T3FREE, THYROIDAB in the last 72 hours.  Invalid input(s): FREET3 Anemia work up: No results for input(s): VITAMINB12, FOLATE, FERRITIN, TIBC, IRON, RETICCTPCT in the last 72 hours. Sepsis Labs: Recent Labs  Lab 03/07/18 1410 03/08/18 0413  WBC 9.4 8.3   Microbiology No results found for this or any previous visit (from the past 240 hour(s)).   Medications:   . albuterol  5 mg Nebulization Once  . amiodarone  200 mg Oral Daily  . atorvastatin  20 mg Oral QHS  . diltiazem  120 mg Oral Daily  . hydrALAZINE  25 mg Oral TID  . loratadine  10 mg Oral Daily   Continuous Infusions: . sodium chloride 50 mL/hr at 03/07/18 2042      LOS: 1 day   [place]  [facility] Pager [phone]  *Please refer to [email or web address], password [email or web address] to get updated schedule on who will round on this patient, as hospitalists switch teams weekly. If 7PM-7AM, please contact night-coverage at [email or web address], password [email or web address] for any overnight needs.  03/08/2018, 8:33 AM

## 2018-03-08 NOTE — H&P (View-Only) (Signed)
Referring Provider:   Dr. Sandi Mariscal Primary Care Physician:  Allie Dimmer, MD Primary Gastroenterologist:  Dr. Lorenza Burton [facility] [place])  Reason for Consultation:  Heme positive stool and anemia  HPI: Nikaela Coyne is a 84 y.o. female on Xarelto for the past couple of years because of atrial fibrillation, no prior history of ulcer disease or GI bleeding, presented with a several week history of shortness of breath and weakness and was found to have significant microcytic anemia, with a presenting hemoglobin of 8.8 (MCV 79) which has fallen overnight to 7.6 following hydration.  Her stool was heme positive but reportedly non-melenic, and the patient in recent weeks has possibly some slightly dark stools but not frank melena.    She has a history of occasional nocturnal reflux symptoms, for which she uses Zantac and occasional Tums or Mylanta, but no other localizing GI tract symptoms.  Not on any ulcerogenic medication such as aspirin or nonsteroidals.    Did have endoscopy about 2 years ago in [facility] following her husband's death when she had nausea and significant weight loss, possibly related to the use of melatonin; at that time, she was found to have a sliding hiatal hernia but apparently no other significant abnormalities, by her report.  The patient also indicates that she had colonoscopy in [facility] in 2009, at which time two small benign polyps were removed, and further routine surveillance examinations were not felt to be required based on her age.   Past Medical History:  Diagnosis Date  . Atrial fibrillation ([place])   . Cancer ([place]) skin  . GERD (gastroesophageal reflux disease)   . Hyperlipidemia   . Hypertension   . Stroke [facility])     Past Surgical History:  Procedure Laterality Date  . CARDIOVERSION N/A 03/04/2016   Procedure: CARDIOVERSION;  Surgeon: Sueanne Margarita, MD;  Location: [place];  Service: Cardiovascular;  Laterality: N/A;  . CATARACT EXTRACTION   2015  . COLONOSCOPY  2009  . TONSILLECTOMY  1953  . TOTAL ABDOMINAL HYSTERECTOMY  1983    Prior to Admission medications   Medication Sig Start Date End Date Taking? Authorizing Provider  acetaminophen (TYLENOL) 500 MG tablet Take 500 mg by mouth 2 (two) times daily as needed for mild pain.    Yes [provider]  amiodarone (PACERONE) 200 MG tablet Take 1 tablet (200 mg total) by mouth daily. 03/30/17  Yes Sherran Needs, NP  atorvastatin (LIPITOR) 20 MG tablet Take 1 tablet by mouth daily. 06/26/15  Yes [provider]  cetirizine (ZYRTEC) 10 MG tablet Take 10 mg by mouth daily.   Yes [provider]  Cholecalciferol (VITAMIN D3 SUPER STRENGTH) 2000 units TABS Take 1 tablet by mouth daily.   Yes [provider]  diltiazem (CARDIZEM) 120 MG tablet Take 120 mg by mouth 1 day or 1 dose.    Yes [provider]  estradiol (ESTRACE) 0.1 MG/GM vaginal cream Place 1 Applicatorful vaginally 2 (two) times a week. Wed and Sun   Yes [provider]  hydrALAZINE (APRESOLINE) 25 MG tablet Take 1 tablet by mouth 3 (three) times daily.    Yes [provider]  losartan (COZAAR) 50 MG tablet TAKE 1 TABLET PO DAILY 08/18/17  Yes [provider]  Multiple Vitamins-Minerals (CENTRUM PO) Take 1 tablet by mouth daily. Not taking on Wed and Sun   Yes [provider]  ranitidine (ZANTAC) 150 MG tablet Take 150 mg by mouth every  evening.   Yes [provider]  rivaroxaban (XARELTO) 15 MG TABS tablet Take 1 tablet (15 mg total) by mouth daily with supper. 05/04/16  Yes Sherran Needs, NP  diazepam (VALIUM) 5 MG tablet Take 5 mg by mouth daily as needed for anxiety.    [provider]  hydrALAZINE (APRESOLINE) 10 MG tablet Take 10 mg by mouth 3 (three) times daily as needed (high blood pressure).     [provider]  Probiotic Product (PROBIOTIC FORMULA) CAPS Take 1 capsule by mouth daily as needed (prevent  infection).     [provider]    Current Facility-Administered Medications  Medication Dose Route Frequency Provider Last Rate Last Dose  . 0.9 %  sodium chloride infusion   Intravenous Continuous Merton Border, MD 50 mL/hr at 03/07/18 2042    . 0.9 %  sodium chloride infusion   Intravenous Once Aileen Fass, Tammi Klippel, MD      . acetaminophen (TYLENOL) tablet 650 mg  650 mg Oral Q6H PRN Merton Border, MD   650 mg at 03/07/18 2041   Or  . acetaminophen (TYLENOL) suppository 650 mg  650 mg Rectal Q6H PRN Merton Border, MD      . albuterol (PROVENTIL) (2.5 MG/3ML) 0.083% nebulizer solution 5 mg  5 mg Nebulization Once Merton Border, MD      . amiodarone (PACERONE) tablet 200 mg  200 mg Oral Daily Merton Border, MD      . atorvastatin (LIPITOR) tablet 20 mg  20 mg Oral QHS Merton Border, MD   20 mg at 03/07/18 2135  . diazepam (VALIUM) tablet 5 mg  5 mg Oral Daily PRN Merton Border, MD   5 mg at 03/07/18 2135  . diltiazem (CARDIZEM) tablet 120 mg  120 mg Oral Daily Merton Border, MD      . hydrALAZINE (APRESOLINE) injection 5 mg  5 mg Intravenous Q6H PRN Merton Border, MD      . hydrALAZINE (APRESOLINE) tablet 25 mg  25 mg Oral TID Merton Border, MD   25 mg at 03/07/18 2135  . HYDROcodone-acetaminophen (NORCO/VICODIN) 5-325 MG per tablet 1-2 tablet  1-2 tablet Oral Q4H PRN Merton Border, MD      . loratadine (CLARITIN) tablet 10 mg  10 mg Oral Daily Merton Border, MD   10 mg at 03/07/18 2135  . ondansetron (ZOFRAN) tablet 4 mg  4 mg Oral Q6H PRN Merton Border, MD       Or  . ondansetron (ZOFRAN) injection 4 mg  4 mg Intravenous Q6H PRN Merton Border, MD      . zolpidem (AMBIEN) tablet 5 mg  5 mg Oral QHS PRN Merton Border, MD        Allergies as of 03/07/2018 - Review Complete 03/07/2018  Allergen Reaction Noted  . Aspirin Anaphylaxis and Swelling 11/25/2009  . Other Anaphylaxis and Swelling 02/13/2016  . Strawberry extract Anaphylaxis 02/13/2016  . Codeine Other (See Comments) 02/13/2016  . Enalapril Other  (See Comments) 03/03/2016  . Melatonin Nausea Only 11/12/2016  . Penicillin g Other (See Comments) 02/13/2016  . Shellfish allergy  11/25/2009  . Shellfish-derived products Other (See Comments) 02/13/2016  . Latex Rash 09/09/2016    Family History  Problem Relation Age of Onset  . Hypertension Mother   . Heart disease Father        CHF  . Stroke Maternal Grandmother   . Cancer Maternal Grandfather   . Heart disease Brother 76  . Leukemia Brother   .  Heart disease Brother 49  . COPD Sister   . Parkinson's disease Sister   . Hypertension Child   . Diabetes Child     Social History   Socioeconomic History  . Marital status: Unknown    Spouse name: Not on file  . Number of children: 2  . Years of education: Not on file  . Highest education level: Not on file  Occupational History  . Not on file  Social Needs  . Financial resource strain: Not on file  . Food insecurity:    Worry: Not on file    Inability: Not on file  . Transportation needs:    Medical: Not on file    Non-medical: Not on file  Tobacco Use  . Smoking status: Never Smoker  . Smokeless tobacco: Never Used  Substance and Sexual Activity  . Alcohol use: No    Alcohol/week: 0.0 oz  . Drug use: No  . Sexual activity: Not on file  Lifestyle  . Physical activity:    Days per week: Not on file    Minutes per session: Not on file  . Stress: Not on file  Relationships  . Social connections:    Talks on phone: Not on file    Gets together: Not on file    Attends religious service: Not on file    Active member of club or organization: Not on file    Attends meetings of clubs or organizations: Not on file    Relationship status: Not on file  . Intimate partner violence:    Fear of current or ex partner: Not on file    Emotionally abused: Not on file    Physically abused: Not on file    Forced sexual activity: Not on file  Other Topics Concern  . Not on file  Social History Narrative   Lives alone.   Husband died 03/01/16    Review of Systems: Comfortable in bed, no abdominal pain, no chest pain or shortness of breath  Physical Exam: Vital signs in last 24 hours: Temp:  [97.8 F (36.6 C)-98.5 F (36.9 C)] 98.1 F (36.7 C) (04/10 0422) Pulse Rate:  [60-78] 69 (04/10 0738) Resp:  [15-22] 19 (04/10 0422) BP: (138-195)/(52-62) 160/57 (04/10 0738) SpO2:  01[birth year]1988 %-97 %] 97 % (04/10 0422) Weight:  [77.6 kg (171 lb)] 77.6 kg (171 lb) (04/09 1329) Last BM Date: 03/06/18 General:   Alert,  Well-developed, well-nourished, pleasant and cooperative in NAD, looks younger than her age, somewhat pale but not at all shocky in appearance Head:  Normocephalic and atraumatic. Eyes:  Sclera clear, no icterus.   Conjunctiva pink despite anemia.. Mouth:   No ulcerations or lesions.  Oropharynx pink & moist. Neck:   No masses or thyromegaly. Lungs: Diminished breath sounds but no wheezes heard Heart:   Regular rate and rhythm; no murmurs, clicks, rubs,  or gallops. Abdomen:  Soft, nontender, mildly tympanitic, and nondistended. No masses, hepatosplenomegaly or ventral hernias noted.  Msk:   Symmetrical without gross deformities. Extremities:   Without clubbing, cyanosis, or edema. Neurologic:  Alert and coherent;  grossly normal neurologically. Skin:  Intact without significant lesions or rashes. Cervical Nodes:  No significant cervical adenopathy. Psych:   Alert and cooperative. Normal mood and affect.  Intake/Output from previous day: No intake/output data recorded. Intake/Output this shift: No intake/output data recorded.  Lab Results: Recent Labs    03/07/18 1410 03/07/18 03-01-2010 03/08/18 0413  WBC 9.4  --  8.3  HGB 8.8*  8.5* 7.6*  HCT 28.8*  --  24.8*  PLT 301  --  284   BMET Recent Labs    03/07/18 1410  NA 135  K 4.0  CL 103  CO2 23  GLUCOSE 148*  BUN 19  CREATININE 1.20*  CALCIUM 8.6*   LFT Recent Labs    03/07/18 1410  PROT 6.8  ALBUMIN 3.6  AST 36  ALT 26  ALKPHOS 63   BILITOT 0.5   PT/INR Recent Labs    03/08/18 0413  LABPROT 13.9  INR 1.08    Studies/Results: Dg Chest 2 View  Result Date: 03/07/2018 CLINICAL DATA:  Cough, shortness of breath for 2 weeks. History of hypertension, atrial fibrillation. EXAM: CHEST - 2 VIEW COMPARISON:  Chest radiograph March 06, 2016 FINDINGS: Cardiac silhouette is mildly enlarged and unchanged. Mildly calcified aortic knob. Pectus excavatum blurring RIGHT heart border. Faint nodular density RIGHT. LEFT lung base strandy densities and pleural thickening without pleural effusion. No pneumothorax. Soft tissue planes and included osseous structures are nonsuspicious. IMPRESSION: Faint nodular density projecting RIGHT lung base can reflect sub position of vascular shadows, atelectasis, pneumonia or, external to the lungs .LEFT lung base atelectasis/scarring. Stable cardiomegaly. Aortic Atherosclerosis (ICD10-I70.0). Electronically Signed   By: Elon Alas M.D.   On: 03/07/2018 13:59    Impression: 1.  Chronic progressive severe anemia characterized by symptoms of weakness and shortness of breath with microcytic indices 2.  Chronic anticoagulation for atrial fibrillation (Xarelto) 3.  Heme positive stool  Plan: I discussed various options with the patient and her daughter, [name], at the bedside.  I feel the best course of action would be to perform upper endoscopy later today, after she receives her transfusion so as to optimize stability prior to the procedure.  This procedure would be relatively safe and easy for the patient.  I have reviewed the risks with the patient and her daughter and they are agreeable to proceed.  If the endoscopy is unrevealing for source of the patient's heme positivity and anemia, we will then have to consider whether to proceed to colonoscopy, and perhaps even a capsule endoscopy, at a later time to complete her GI work-up.  I do feel the patient is fit to undergo colonoscopy, should that be  necessary.  It would then be a question of timing, whether to do it while she is still in the hospital so we have the benefit of that information prior to resumption of anticoagulation, or whether to try to further optimize the patient's status by allowing her hemoglobin to "hopefully" rise over the next several weeks with iron supplementation.   LOS: 1 day   [place] [facility]  03/08/2018, 9:52 AM   Pager [phone] If no answer or after 5 PM call [phone]

## 2018-03-08 NOTE — Progress Notes (Signed)
Patient's EGD was unrevealing for source of anemia, so we plan to proceed to colonoscopic evaluation tomorrow morning.  Risks have been reviewed with the patient and her daughter and she is agreeable.  Prep orders written.  [name], M.D. Pager [phone] If no answer or after 5 PM call [phone]

## 2018-03-08 NOTE — Anesthesia Procedure Notes (Signed)
Procedure Name: MAC Date/Time: 03/08/2018 2:11 PM Performed by: Dione Booze, CRNA Pre-anesthesia Checklist: Patient identified, Emergency Drugs available, Suction available and Patient being monitored Patient Re-evaluated:Patient Re-evaluated prior to induction Oxygen Delivery Method: Nasal cannula Placement Confirmation: positive ETCO2

## 2018-03-09 ENCOUNTER — Encounter (HOSPITAL_COMMUNITY): Payer: Self-pay | Admitting: Gastroenterology

## 2018-03-09 ENCOUNTER — Encounter (HOSPITAL_COMMUNITY)
Admission: EM | Disposition: A | Discharge status: Home / Self Care | Payer: Self-pay | Source: Home / Self Care | Attending: Internal Medicine

## 2018-03-09 ENCOUNTER — Inpatient Hospital Stay (HOSPITAL_COMMUNITY): Payer: Medicare Other | Admitting: Anesthesiology

## 2018-03-09 ENCOUNTER — Telehealth: Payer: Self-pay | Admitting: Cardiology

## 2018-03-09 HISTORY — PX: GIVENS CAPSULE STUDY: SHX5432

## 2018-03-09 HISTORY — PX: COLONOSCOPY WITH PROPOFOL: SHX5780

## 2018-03-09 LAB — TYPE AND SCREEN
ABO/RH(D): O POS
ANTIBODY SCREEN: NEGATIVE
UNIT DIVISION: 0

## 2018-03-09 LAB — FOLATE RBC
FOLATE, HEMOLYSATE: 575.5 ng/mL
FOLATE, RBC: 1906 ng/mL (ref >498)
HEMATOCRIT: 30.2 % — AB (ref 34.0–46.6)

## 2018-03-09 LAB — BPAM RBC
Blood Product Expiration Date: 201905082359
ISSUE DATE / TIME: 201904101017
Unit Type and Rh: 5100

## 2018-03-09 LAB — HEMOGLOBIN: HEMOGLOBIN: 9.1 g/dL — AB (ref 12.0–15.0)

## 2018-03-09 SURGERY — COLONOSCOPY WITH PROPOFOL
Anesthesia: Monitor Anesthesia Care

## 2018-03-09 SURGERY — IMAGING PROCEDURE, GI TRACT, INTRALUMINAL, VIA CAPSULE
Anesthesia: LOCAL

## 2018-03-09 MED ORDER — SODIUM CHLORIDE 0.9 % IV SOLN
510.0000 mg | Freq: Once | INTRAVENOUS | Status: AC
  Administered 2018-03-09: 510 mg via INTRAVENOUS
  Filled 2018-03-09: qty 17

## 2018-03-09 MED ORDER — SODIUM CHLORIDE 0.9 % IV SOLN: INTRAVENOUS | Status: DC

## 2018-03-09 MED ORDER — PROPOFOL 10 MG/ML IV BOLUS
INTRAVENOUS | Status: AC
  Filled 2018-03-09: qty 40

## 2018-03-09 MED ORDER — MORPHINE SULFATE (PF) 4 MG/ML IV SOLN: 1.0000 mg | INTRAVENOUS | Status: DC | PRN

## 2018-03-09 MED ORDER — PROPOFOL 10 MG/ML IV BOLUS
INTRAVENOUS | Status: DC | PRN
  Administered 2018-03-09 (×8): 50 mg via INTRAVENOUS

## 2018-03-09 MED ORDER — FERROUS SULFATE 325 (65 FE) MG PO TABS
325.0000 mg | ORAL_TABLET | Freq: Three times a day (TID) | ORAL | Status: DC
  Administered 2018-03-10 (×2): 325 mg via ORAL
  Filled 2018-03-09 (×2): qty 1

## 2018-03-09 MED ORDER — LACTATED RINGERS IV SOLN
INTRAVENOUS | Status: DC | PRN
  Administered 2018-03-09: 10:00:00 via INTRAVENOUS

## 2018-03-09 MED ORDER — LACTATED RINGERS IV SOLN
INTRAVENOUS | Status: DC
  Administered 2018-03-09: 1000 mL via INTRAVENOUS

## 2018-03-09 MED ORDER — LIDOCAINE 2% (20 MG/ML) 5 ML SYRINGE
INTRAMUSCULAR | Status: DC | PRN
  Administered 2018-03-09: 60 mg via INTRAVENOUS

## 2018-03-09 SURGICAL SUPPLY — 22 items

## 2018-03-09 SURGICAL SUPPLY — 1 items: TOWEL COTTON PACK 4EA (MISCELLANEOUS) ×4 IMPLANT

## 2018-03-09 NOTE — Anesthesia Postprocedure Evaluation (Signed)
Anesthesia Post Note  Patient: Sarah Armstrong  Procedure(s) Performed: COLONOSCOPY WITH PROPOFOL (N/A )     Patient location during evaluation: PACU Anesthesia Type: MAC Level of consciousness: awake and alert Pain management: pain level controlled Vital Signs Assessment: post-procedure vital signs reviewed and stable Respiratory status: spontaneous breathing, nonlabored ventilation, respiratory function stable and patient connected to nasal cannula oxygen Cardiovascular status: stable and blood pressure returned to baseline Postop Assessment: no apparent nausea or vomiting Anesthetic complications: no    Last Vitals:  Vitals:   03/09/18 1100 03/09/18 1113  BP: (!) 178/52 (!) 169/45  Pulse: 79 76  Resp: (!) 22 19  Temp:    SpO2: 99% 96%    Last Pain:  Vitals:   03/09/18 1113  TempSrc:   PainSc: 0-No pain                 Daylan Juhnke EDWARD

## 2018-03-09 NOTE — Transfer of Care (Signed)
Immediate Anesthesia Transfer of Care Note  Patient: Sarah Armstrong  Procedure(s) Performed: COLONOSCOPY WITH PROPOFOL (N/A )  Patient Location: PACU and Endoscopy Unit  Anesthesia Type:MAC  Level of Consciousness: awake and alert   Airway & Oxygen Therapy: Patient Spontanous Breathing and Patient connected to face mask oxygen  Post-op Assessment: Report given to RN and Post -op Vital signs reviewed and stable  Post vital signs: Reviewed and stable  Last Vitals:  Vitals Value Taken Time  BP 146/43 03/09/2018 10:44 AM  Temp 36.4 C 03/09/2018 10:44 AM  Pulse 72 03/09/2018 10:45 AM  Resp 18 03/09/2018 10:45 AM  SpO2 100 % 03/09/2018 10:45 AM  Vitals shown include unvalidated device data.  Last Pain:  Vitals:   03/09/18 1044  TempSrc: Oral  PainSc: 0-No pain         Complications: No apparent anesthesia complications

## 2018-03-09 NOTE — Anesthesia Postprocedure Evaluation (Signed)
Anesthesia Post Note  Patient: Sarah Armstrong  Procedure(s) Performed: ESOPHAGOGASTRODUODENOSCOPY (EGD) WITH PROPOFOL (N/A )     Patient location during evaluation: PACU Anesthesia Type: MAC Level of consciousness: awake and alert Pain management: pain level controlled Vital Signs Assessment: post-procedure vital signs reviewed and stable Respiratory status: spontaneous breathing, nonlabored ventilation, respiratory function stable and patient connected to nasal cannula oxygen Cardiovascular status: stable and blood pressure returned to baseline Postop Assessment: no apparent nausea or vomiting Anesthetic complications: no    Last Vitals:  Vitals:   03/08/18 2120 03/09/18 0532  BP: (!) 167/56 (!) 185/62  Pulse: 73 77  Resp: 19 20  Temp: 36.6 C 36.7 C  SpO2: 99% 99%    Last Pain:  Vitals:   03/09/18 0532  TempSrc: Oral  PainSc:                  [name] S

## 2018-03-09 NOTE — Interval H&P Note (Signed)
History and Physical Interval Note:  03/09/2018 8:59 AM  Sarah Armstrong  has presented today for surgery, with the diagnosis of heme positive stool and anemia  The various methods of treatment have been discussed with the patient and family. After consideration of risks, benefits and other options for treatment, the patient has consented to  Procedure(s): COLONOSCOPY WITH PROPOFOL (N/A) as a surgical intervention .  The patient's history has been reviewed, patient examined, no change in status, stable for surgery.  I have reviewed the patient's chart and labs.  Questions were answered to the patient's satisfaction.     [name]

## 2018-03-09 NOTE — Telephone Encounter (Signed)
Spoke with Pt's daughter who reports her mother is currently admitted in the hospital and was told to consult with her Cardiologists in regards to her Xarelto dosage. Daughter states her mother had to have 1 unit of PRBC due to hgb dropping to 7.6 and positive for blood in stool. Per Dr. Cristina Gong notes from colonoscopy results, it states there were no source of bleeding found and pt may resume her anticoagulation dose starting tomorrow evening. Routed to Pharmacist for recommendation.

## 2018-03-09 NOTE — Anesthesia Procedure Notes (Signed)
Procedure Name: MAC Date/Time: 03/09/2018 10:02 AM Performed by: Cynda Familia, CRNA Pre-anesthesia Checklist: Patient identified, Suction available, Patient being monitored, Timeout performed and Emergency Drugs available Patient Re-evaluated:Patient Re-evaluated prior to induction Oxygen Delivery Method: Simple face mask Placement Confirmation: positive ETCO2 and breath sounds checked- equal and bilateral Dental Injury: Teeth and Oropharynx as per pre-operative assessment

## 2018-03-09 NOTE — Progress Notes (Signed)
[facility] PROGRESS NOTE    Progress Note  Sarah Armstrong  ZDG:[id number] DOB: [birth year] DOA: 03/07/2018 PCP: Allie Dimmer, MD     Brief Narrative:   Sarah Armstrong is an 84 y.o. female past medical history significant for atrial fibrillation on Xarelto presents with one-month history of shortness of breath, was found to have a hemoglobin of 8.5 in the ED.  Assessment/Plan:   Acute blood loss anemia/ GI bleed: She is status post EGD and colonoscopy that showed no source of bleeding she will start her capsule endoscopy study today. Continue to hold Xarelto.  HBg is improved and 9.0.  No signs of overt bleeding. Ferritin of 7 we will give him IV iron and she will continue oral sulfate as an outpatient will need to follow-up with PCP.  Persistent atrial fibrillation (HCC) Rate control continue amiodarone. Can resume Xarelto as an outpatient.  Essential hypertension Mildly elevated in the setting of acute blood loss anemia and acute renal failure, will continue to hold antihypertensive medication. Continue IV fluid hydration.  AKI (acute kidney injury) ([place]) Likely prerenal in etiology now planning at baseline continue IV fluids.    DVT prophylaxis: scd Family Communication:daughter Disposition Plan/Barrier to D/C: home once Hbg stable. Code Status:     Code Status Orders  (From admission, onward)        Start     Ordered   03/07/18 1950  Full code  Continuous     03/07/18 1949    Code Status History    This patient has a current code status but no historical code status.    Advance Directive Documentation     Most Recent Value  Type of Advance Directive  Healthcare Power of Attorney, Living will  Pre-existing out of facility DNR order (yellow form or pink MOST form)  -  "MOST" Form in Place?  -        IV Access:    Peripheral IV   Procedures and diagnostic studies:   Dg Chest 2 View  Result Date: 03/07/2018 CLINICAL DATA:  Cough, shortness of  breath for 2 weeks. History of hypertension, atrial fibrillation. EXAM: CHEST - 2 VIEW COMPARISON:  Chest radiograph March 06, 2016 FINDINGS: Cardiac silhouette is mildly enlarged and unchanged. Mildly calcified aortic knob. Pectus excavatum blurring RIGHT heart border. Faint nodular density RIGHT. LEFT lung base strandy densities and pleural thickening without pleural effusion. No pneumothorax. Soft tissue planes and included osseous structures are nonsuspicious. IMPRESSION: Faint nodular density projecting RIGHT lung base can reflect sub position of vascular shadows, atelectasis, pneumonia or, external to the lungs .LEFT lung base atelectasis/scarring. Stable cardiomegaly. Aortic Atherosclerosis (ICD10-I70.0). Electronically Signed   By: Elon Alas M.D.   On: 03/07/2018 13:59     Medical Consultants:    None.  Anti-Infectives:   None  Subjective:    Sarah Armstrong no complains  Objective:    Vitals:   03/09/18 0846 03/09/18 1044 03/09/18 1100 03/09/18 1113  BP: (!) 187/49 (!) 146/43 (!) 178/52 (!) 169/45  Pulse: 80 72 79 76  Resp: 12 (!) 21 (!) 22 19  Temp: 98.1 F (36.7 C) 97.6 F (36.4 C)    TempSrc: Oral Oral    SpO2: 94% 100% 99% 96%  Weight: 76.2 kg (168 lb)     Height: 5\' 7"  (1.702 m)       Intake/Output Summary (Last 24 hours) at 03/09/2018 1205 Last data filed at 03/09/2018 1047 Gross per 24 hour  Intake 1604.92 ml  Output  0 ml  Net 1604.92 ml   Filed Weights   03/07/18 1329 03/08/18 1331 03/09/18 0846  Weight: 77.6 kg (171 lb) 77.6 kg (171 lb) 76.2 kg (168 lb)    Exam: General exam: In no acute distress pale looking Respiratory system: Good air movement and clear to auscultation. Cardiovascular system: S1 & S2 heard, RRR.  Gastrointestinal system: Abdomen is nondistended, soft and nontender.  Central nervous system: Alert and oriented. No focal neurological deficits. Extremities: No pedal edema. Skin: No rashes, lesions or ulcers Psychiatry: Judgement  and insight appear normal. Mood & affect appropriate.    Data Reviewed:    Labs: Basic Metabolic Panel: Recent Labs  Lab 03/07/18 1410  NA 135  K 4.0  CL 103  CO2 23  GLUCOSE 148*  BUN 19  CREATININE 1.20*  CALCIUM 8.6*   GFR Estimated Creatinine Clearance: 37.1 mL/min (A) (by C-G formula based on SCr of 1.2 mg/dL (H)). Liver Function Tests: Recent Labs  Lab 03/07/18 1410  AST 36  ALT 26  ALKPHOS 63  BILITOT 0.5  PROT 6.8  ALBUMIN 3.6   No results for input(s): LIPASE, AMYLASE in the last 168 hours. No results for input(s): AMMONIA in the last 168 hours. Coagulation profile Recent Labs  Lab 03/08/18 0413  INR 1.08    CBC: Recent Labs  Lab 03/07/18 1410 03/07/18 2011 03/08/18 0413 03/08/18 1530 03/08/18 2008 03/09/18 0513  WBC 9.4  --  8.3  --   --   --   NEUTROABS 6.1  --   --   --   --   --   HGB 8.8* 8.5* 7.6* 9.3* 9.2* 9.1*  HCT 28.8*  --  24.8* 30.7*  --   --   MCV 79.1  --  79.5  --   --   --   PLT 301  --  284  --   --   --    Cardiac Enzymes: Recent Labs  Lab 03/07/18 1410  TROPONINI <0.03   BNP (last 3 results) No results for input(s): PROBNP in the last 8760 hours. CBG: No results for input(s): GLUCAP in the last 168 hours. D-Dimer: No results for input(s): DDIMER in the last 72 hours. Hgb A1c: No results for input(s): HGBA1C in the last 72 hours. Lipid Profile: No results for input(s): CHOL, HDL, LDLCALC, TRIG, CHOLHDL, LDLDIRECT in the last 72 hours. Thyroid function studies: No results for input(s): TSH, T4TOTAL, T3FREE, THYROIDAB in the last 72 hours.  Invalid input(s): FREET3 Anemia work up: Recent Labs    03/08/18 1530  FERRITIN 7*  TIBC 484*  IRON 30  RETICCTPCT 1.4   Sepsis Labs: Recent Labs  Lab 03/07/18 1410 03/08/18 0413  WBC 9.4 8.3   Microbiology No results found for this or any previous visit (from the past 240 hour(s)).   Medications:   . albuterol  5 mg Nebulization Once  . amiodarone  200 mg  Oral Daily  . atorvastatin  20 mg Oral QHS  . diltiazem  120 mg Oral Daily  . hydrALAZINE  25 mg Oral TID  . hydrocortisone cream   Topical QID  . loratadine  10 mg Oral Daily   Continuous Infusions: . sodium chloride 50 mL/hr at 03/08/18 1715  . sodium chloride        LOS: 2 days   Charlynne Cousins  [facility] Pager [phone]  *Please refer to [email or web address], password [email or web address] to get updated schedule on who will round on this  patient, as hospitalists switch teams weekly. If 7PM-7AM, please contact night-coverage at [email or web address], password [email or web address] for any overnight needs.  03/09/2018, 12:05 PM

## 2018-03-09 NOTE — Progress Notes (Signed)
Colonoscopy showed some sigmoid diverticulosis and several small polyps, which I removed.  However, no source for the patient's heme positive stool or anemia was identified.  Recommendations:  1.  Capsule endoscopy study today to check the small bowel 2.  May resume anticoagulation starting tomorrow evening (since the polyps were very small and were not removed with cautery, there should be very little risk of delayed bleeding) 3.  Patient may resume a regular diet approximately 6 hours after ingestion of the capsule  [name], M.D. Pager [phone] If no answer or after 5 PM call [phone]

## 2018-03-09 NOTE — Telephone Encounter (Signed)
Returned call to pt's daughter to advise of of Pharmacist recommendation. Verbalized understanding.

## 2018-03-09 NOTE — Telephone Encounter (Signed)
Patient currently following up by acute care (in-patient). They will manage her medication while in the hospital.   We will provide additional instructions as needed while patient discharge from hospital. Per GI doctor is okay to resume Xarelto but need to complete assessment in hospital prior to further recommendations.

## 2018-03-09 NOTE — Telephone Encounter (Signed)
New message   Patient's daughter calling from [facility], states patient is going to have procedure today, inpatient. She was told to call and  ask about holding medications. Please call.

## 2018-03-09 NOTE — Anesthesia Postprocedure Evaluation (Signed)
Anesthesia Post Note  Patient: Flois Provence  Procedure(s) Performed: COLONOSCOPY WITH PROPOFOL (N/A )     Patient location during evaluation: PACU Anesthesia Type: MAC Level of consciousness: awake and alert Pain management: pain level controlled Vital Signs Assessment: post-procedure vital signs reviewed and stable Respiratory status: spontaneous breathing, nonlabored ventilation, respiratory function stable and patient connected to nasal cannula oxygen Cardiovascular status: stable and blood pressure returned to baseline Postop Assessment: no apparent nausea or vomiting Anesthetic complications: no    Last Vitals:  Vitals:   03/09/18 1100 03/09/18 1113  BP: (!) 178/52 (!) 169/45  Pulse: 79 76  Resp: (!) 22 19  Temp:    SpO2: 99% 96%    Last Pain:  Vitals:   03/09/18 1113  TempSrc:   PainSc: 0-No pain                 Yohann Curl EDWARD     

## 2018-03-09 NOTE — Progress Notes (Signed)
Patient ingested givens capsule for small bowel endoscopy 03/09/2018 at 1305. Patient is to remain NPO for 2 hours following ingestion. Patient is to wear equipment for 12 full hours. Endoscopy staff with collect equipment 03/10/18 AM.

## 2018-03-09 NOTE — Anesthesia Preprocedure Evaluation (Addendum)
Anesthesia Evaluation  Patient identified by MRN, date of birth, ID band Patient awake    Reviewed: Allergy & Precautions, NPO status , Patient's Chart, lab work & pertinent test results  Airway Mallampati: II  TM Distance: >3 FB Neck ROM: Full    Dental no notable dental hx. (+) Dental Advisory Given   Pulmonary neg pulmonary ROS,    Pulmonary exam normal breath sounds clear to auscultation       Cardiovascular hypertension, Normal cardiovascular exam Rhythm:Regular Rate:Normal     Neuro/Psych CVA negative psych ROS   GI/Hepatic negative GI ROS, Neg liver ROS,   Endo/Other  negative endocrine ROS  Renal/GU negative Renal ROS  negative genitourinary   Musculoskeletal negative musculoskeletal ROS (+)   Abdominal   Peds negative pediatric ROS (+)  Hematology  (+) Blood dyscrasia, anemia , On xarelto   Anesthesia Other Findings   Reproductive/Obstetrics negative OB ROS                            Anesthesia Physical  Anesthesia Plan  ASA: III  Anesthesia Plan: MAC   Post-op Pain Management:    Induction: Intravenous  PONV Risk Score and Plan: 0  Airway Management Planned: Nasal Cannula  Additional Equipment:   Intra-op Plan:   Post-operative Plan:   Informed Consent: I have reviewed the patients History and Physical, chart, labs and discussed the procedure including the risks, benefits and alternatives for the proposed anesthesia with the patient or authorized representative who has indicated his/her understanding and acceptance.   Dental advisory given  Plan Discussed with: CRNA and Surgeon  Anesthesia Plan Comments:         Anesthesia Quick Evaluation

## 2018-03-09 NOTE — Op Note (Signed)
[facility] Patient Name: Sarah Armstrong Procedure Date: 03/09/2018 MRN: [id number] Attending MD: Ronald Lobo , MD Date of Birth: 23-Aug-1931 CSN: [id number] Age: 84 Admit Type: Inpatient Procedure:                Colonoscopy Indications:              Last colonoscopy 10 years ago, , Iron deficiency                            anemia secondary to chronic blood loss ([place],                            [place]) while on Xarelto, with drop in hgb from 13 to                            7.6 over the past 6 months Providers:                Ronald Lobo, MD, Elmer Ramp. Hinson, RN, Janie                            Billups, Technician, Glenis Smoker, CRNA Referring MD:              Medicines:                Monitored Anesthesia Care Complications:            No immediate complications. Estimated Blood Loss:     Estimated blood loss: none. Estimated blood loss                            was minimal. Procedure:                Pre-Anesthesia Assessment:                           - Prior to the procedure, a History and Physical                            was performed, and patient medications and                            allergies were reviewed. The patient's tolerance of                            previous anesthesia was also reviewed. The risks                            and benefits of the procedure and the sedation                            options and risks were discussed with the patient.                            All questions were answered, and informed consent  was obtained. Prior Anticoagulants: The patient has                            taken Xarelto (rivaroxaban), last dose was 3 days                            prior to procedure. ASA Grade Assessment: III - A                            patient with severe systemic disease. After                            reviewing the risks and benefits, the patient was                            deemed in  satisfactory condition to undergo the                            procedure.                           After obtaining informed consent, the colonoscope                            was passed under direct vision. Throughout the                            procedure, the patient's blood pressure, pulse, and                            oxygen saturations were monitored continuously. The                            [id number] ([id number]) Pediatric colonoscope was                            introduced through the anus and advanced to the the                            terminal ileum. The colonoscopy was performed                            without difficulty. The patient tolerated the                            procedure well. The quality of the bowel                            preparation was excellent. Scope In: 10:12:30 AM Scope Out: 10:35:25 AM Scope Withdrawal Time: 0 hours 13 minutes 37 seconds  Total Procedure Duration: 0 hours 22 minutes 55 seconds  Findings:      Three sessile polyps were found in the transverse colon and cecum. The       polyps were 4 to 6 mm in size.  These polyps were removed with a cold       snare. Resection and retrieval were complete. Estimated blood loss was       minimal.      A few medium-mouthed diverticula were found in the sigmoid colon.      The exam was otherwise normal throughout the examined colon. No masses,       colitis, vascular malformations, or inflammation were noted.      The terminal ileum appeared normal.      The retroflexed view of the distal rectum and anal verge was normal and       showed no anal or rectal abnormalities. Impression:               - No source of anemia endoscopically evident. No                            blood in the colonic lumen.                           - Three 4 to 6 mm polyps in the transverse colon                            and in the cecum, removed with a cold snare.                            Resected and  retrieved.                           - Diverticulosis in the sigmoid colon.                           - The examined portion of the ileum was normal.                           - The distal rectum and anal verge are normal on                            retroflexion view. Moderate Sedation:      This patient was sedated with monitored anesthesia care, not moderate       sedation. Recommendation:           - Await pathology results.                           - To visualize the small bowel, perform video                            capsule endoscopy later today. Procedure Code(s):        --- Professional ---                           [phone], Colonoscopy, flexible; with removal of                            tumor(s), polyp(s), or other lesion(s) by snare  technique Diagnosis Code(s):        --- Professional ---                           D12.3, Benign neoplasm of transverse colon (hepatic                            flexure or splenic flexure)                           D12.0, Benign neoplasm of cecum                           D50.0, Iron deficiency anemia secondary to blood                            loss (chronic) CPT copyright 2017 [employer]. All rights reserved. The codes documented in this report are preliminary and upon coder review may  be revised to meet current compliance requirements. Ronald Lobo, MD 03/09/2018 10:59:54 AM This report has been signed electronically. Number of Addenda: 0

## 2018-03-10 ENCOUNTER — Encounter (HOSPITAL_COMMUNITY): Payer: Self-pay | Admitting: Gastroenterology

## 2018-03-10 LAB — HEMOGLOBIN AND HEMATOCRIT, BLOOD
HCT: 28.9 % — ABNORMAL LOW (ref 36.0–46.0)
HEMOGLOBIN: 8.8 g/dL — AB (ref 12.0–15.0)

## 2018-03-10 MED ORDER — POLYETHYLENE GLYCOL 3350 17 G PO PACK: 17.0000 g | PACK | Freq: Every day | ORAL | Status: DC

## 2018-03-10 MED ORDER — FERROUS SULFATE 325 (65 FE) MG PO TABS: 325.0000 mg | ORAL_TABLET | Freq: Three times a day (TID) | ORAL | 3 refills | Status: DC

## 2018-03-10 MED ORDER — POLYETHYLENE GLYCOL 3350 17 G PO PACK: 17.0000 g | PACK | Freq: Every day | ORAL | 0 refills | Status: DC

## 2018-03-10 NOTE — Evaluation (Signed)
Physical Therapy Evaluation Patient Details Name: Sarah Armstrong MRN: [id number] DOB: 04/07/1931 Today's Date: 03/10/2018   History of Present Illness  Carlea Badour is an 84 y.o. female past medical history significant for atrial fibrillation on Xarelto presents with one-month history of shortness of breath, was found to have a hemoglobin of 8.5 in the ED.  Clinical Impression  Pt admitted with above diagnosis. Pt currently with functional limitations due to the deficits listed below (see PT Problem List).   Pt is quite independent at her baseline, amb with cane--currently pt demonstrates decr dynamic standing balance,  fatigues rapidly, only able to amb ~ 15' with min assist, HHA +cane for support; recommend HHPT, may need RW for home use if pt does not progress quickly; would likely benefit from one more day in current setting, encouraged pt to be OOB as much as possible, up for meals etc; will continue to follow   Pt will benefit from skilled PT to increase their independence and safety with mobility to allow discharge to the venue listed below.       Follow Up Recommendations Home health PT;Supervision for mobility/OOB(may need incr supervision initially)    Equipment Recommendations  None recommended by PT(?may need RW )    Recommendations for Other Services       Precautions / Restrictions Precautions Precautions: Fall Restrictions Weight Bearing Restrictions: No      Mobility  Bed Mobility               General bed mobility comments: pt on EOB  Transfers Overall transfer level: Needs assistance Equipment used: Straight cane Transfers: Sit to/from Stand Sit to Stand: Min guard;Min assist         General transfer comment: cues for hand placement  Ambulation/Gait Ambulation/Gait assistance: Min guard;Min assist   Assistive device: 1 person hand held assist;Straight cane Gait Pattern/deviations: Step-through pattern;Decreased stride length;Trunk flexed Gait  velocity: decr   General Gait Details: min to min/guard for balance and safety; pt is unsteady and requiring HHA + cane  for balance, attempting to "furniture walk" without HHA  Stairs            Wheelchair Mobility    Modified Rankin (Stroke Patients Only)       Balance Overall balance assessment: Needs assistance Sitting-balance support: Feet supported;No upper extremity supported Sitting balance-Leahy Scale: Good     Standing balance support: Single extremity supported;During functional activity Standing balance-Leahy Scale: Fair Standing balance comment: reliant on at least unilateral UE support for dynamic activities                             Pertinent Vitals/Pain Pain Assessment: No/denies pain    Home Living Family/patient expects to be discharged to:: Private residence Living Arrangements: Alone Available Help at Discharge: Family Type of Home: House       Home Layout: One level Home Equipment: Kasandra Knudsen - single point Additional Comments: drives, does her own shopping, meals, laundry etc; dtr lives across the street--dtr is a Sport and exercise psychologist for Hosp[employer]     Prior Function Level of Independence: Independent               Hand Dominance        Extremity/Trunk Assessment   Upper Extremity Assessment Upper Extremity Assessment: Generalized weakness    Lower Extremity Assessment Lower Extremity Assessment: Generalized weakness       Communication   Communication: No difficulties  Cognition Arousal/Alertness:  Awake/alert Behavior During Therapy: WFL for tasks assessed/performed Overall Cognitive Status: Within Functional Limits for tasks assessed                                        General Comments      Exercises     Assessment/Plan    PT Assessment Patient needs continued PT services  PT Problem List Decreased strength;Decreased activity tolerance;Decreased balance;Decreased mobility       PT Treatment  Interventions DME instruction;Gait training;Functional mobility training;Therapeutic activities;Patient/family education;Therapeutic exercise;Balance training    PT Goals (Current goals can be found in the Care Plan section)  Acute Rehab PT Goals Patient Stated Goal: home soon, get stronger PT Goal Formulation: With patient Time For Goal Achievement: 03/24/18 Potential to Achieve Goals: Good    Frequency Min 3X/week   Barriers to discharge        Co-evaluation               AM-PAC PT "6 Clicks" Daily Activity  Outcome Measure Difficulty turning over in bed (including adjusting bedclothes, sheets and blankets)?: A Little Difficulty moving from lying on back to sitting on the side of the bed? : A Little Difficulty sitting down on and standing up from a chair with arms (e.g., wheelchair, bedside commode, etc,.)?: Unable Help needed moving to and from a bed to chair (including a wheelchair)?: A Little Help needed walking in hospital room?: A Lot Help needed climbing 3-5 steps with a railing? : A Lot 6 Click Score: 14    End of Session Equipment Utilized During Treatment: Gait belt Activity Tolerance: Patient limited by fatigue Patient left: in chair;with family/visitor present   PT Visit Diagnosis: Muscle weakness (generalized) (M62.81);Unsteadiness on feet (R26.81)    Time: 8638-1771 PT Time Calculation (min) (ACUTE ONLY): 26 min   Charges:   PT Evaluation $PT Eval Low Complexity: 1 Low PT Treatments $Gait Training: 8-22 mins   PT G CodesKenyon Ana, PT Pager: [phone] 03/10/2018   Kenyon Ana 03/10/2018, 1:10 PM

## 2018-03-10 NOTE — Progress Notes (Signed)
[facility] PROGRESS NOTE    Progress Note  Sarah Armstrong  GXQ:[id number] DOB: July 30, 1931 DOA: 03/07/2018 PCP: Allie Dimmer, MD     Brief Narrative:   Sarah Armstrong is an 84 y.o. female past medical history significant for atrial fibrillation on Xarelto presents with one-month history of shortness of breath, was found to have a hemoglobin of 8.5 in the ED.  Assessment/Plan:   Acute blood loss anemia/ GI bleed: Status post EGD and colonoscopy that showed no source of bleeding. Capsule endoscopy performed, she has had no signs of further bleeding hemoglobin has been stable. Physical therapy has been consulted for evaluation.  Persistent atrial fibrillation (HCC) Rate control continue amiodarone. Can resume Xarelto as an outpatient.  Essential hypertension No changes made to her medications  AKI (acute kidney injury) ([place]) Likely prerenal in etiology now planning at baseline continue IV fluids.   DVT prophylaxis: scd Family Communication:daughter Disposition Plan/Barrier to D/C: home once capsule endocoscopy done Code Status:     Code Status Orders  (From admission, onward)        Start     Ordered   03/07/18 1950  Full code  Continuous     03/07/18 1949    Code Status History    This patient has a current code status but no historical code status.    Advance Directive Documentation     Most Recent Value  Type of Advance Directive  Healthcare Power of Attorney, Living will  Pre-existing out of facility DNR order (yellow form or pink MOST form)  -  "MOST" Form in Place?  -        IV Access:    Peripheral IV   Procedures and diagnostic studies:   No results found.   Medical Consultants:    None.  Anti-Infectives:   None  Subjective:    Sarah Armstrong no complains  Objective:    Vitals:   03/09/18 1339 03/09/18 2055 03/10/18 0505 03/10/18 0920  BP: (!) 163/57 (!) 167/55 (!) 138/50 (!) 166/51  Pulse: 76 82 71 77  Resp: 18 17 (!) 22 18    Temp: 98 F (36.7 C) 98.7 F (37.1 C) 98.9 F (37.2 C) 97.9 F (36.6 C)  TempSrc: Oral  Oral Oral  SpO2: 95% 98% 94% 93%  Weight:      Height:        Intake/Output Summary (Last 24 hours) at 03/10/2018 0958 Last data filed at 03/10/2018 0535 Gross per 24 hour  Intake 2159.17 ml  Output 0 ml  Net 2159.17 ml   Filed Weights   03/07/18 1329 03/08/18 1331 03/09/18 0846  Weight: 77.6 kg (171 lb) 77.6 kg (171 lb) 76.2 kg (168 lb)    Exam: General exam: In no acute distress pale looking Respiratory system: Good air movement and clear to auscultation. Cardiovascular system: S1 & S2 heard, RRR.  Gastrointestinal system: Abdomen is nondistended, soft and nontender.  Central nervous system: Alert and oriented. No focal neurological deficits. Extremities: No pedal edema. Skin: No rashes, lesions or ulcers Psychiatry: Judgement and insight appear normal. Mood & affect appropriate.    Data Reviewed:    Labs: Basic Metabolic Panel: Recent Labs  Lab 03/07/18 1410  NA 135  K 4.0  CL 103  CO2 23  GLUCOSE 148*  BUN 19  CREATININE 1.20*  CALCIUM 8.6*   GFR Estimated Creatinine Clearance: 37.1 mL/min (A) (by C-G formula based on SCr of 1.2 mg/dL (H)). Liver Function Tests: Recent Labs  Lab 03/07/18  1410  AST 36  ALT 26  ALKPHOS 63  BILITOT 0.5  PROT 6.8  ALBUMIN 3.6   No results for input(s): LIPASE, AMYLASE in the last 168 hours. No results for input(s): AMMONIA in the last 168 hours. Coagulation profile Recent Labs  Lab 03/08/18 0413  INR 1.08    CBC: Recent Labs  Lab 03/07/18 1410 03/07/18 2011 03/08/18 0413 03/08/18 1530 03/08/18 2008 03/09/18 0513  WBC 9.4  --  8.3  --   --   --   NEUTROABS 6.1  --   --   --   --   --   HGB 8.8* 8.5* 7.6* 9.3* 9.2* 9.1*  HCT 28.8*  --  24.8* 30.7*  30.2*  --   --   MCV 79.1  --  79.5  --   --   --   PLT 301  --  284  --   --   --    Cardiac Enzymes: Recent Labs  Lab 03/07/18 1410  TROPONINI <0.03   BNP  (last 3 results) No results for input(s): PROBNP in the last 8760 hours. CBG: No results for input(s): GLUCAP in the last 168 hours. D-Dimer: No results for input(s): DDIMER in the last 72 hours. Hgb A1c: No results for input(s): HGBA1C in the last 72 hours. Lipid Profile: No results for input(s): CHOL, HDL, LDLCALC, TRIG, CHOLHDL, LDLDIRECT in the last 72 hours. Thyroid function studies: No results for input(s): TSH, T4TOTAL, T3FREE, THYROIDAB in the last 72 hours.  Invalid input(s): FREET3 Anemia work up: Recent Labs    03/08/18 1530  FERRITIN 7*  TIBC 484*  IRON 30  RETICCTPCT 1.4   Sepsis Labs: Recent Labs  Lab 03/07/18 1410 03/08/18 0413  WBC 9.4 8.3   Microbiology No results found for this or any previous visit (from the past 240 hour(s)).   Medications:   . albuterol  5 mg Nebulization Once  . amiodarone  200 mg Oral Daily  . atorvastatin  20 mg Oral QHS  . diltiazem  120 mg Oral Daily  . ferrous sulfate  325 mg Oral TID WC  . hydrALAZINE  25 mg Oral TID  . hydrocortisone cream   Topical QID  . loratadine  10 mg Oral Daily   Continuous Infusions: . sodium chloride 50 mL/hr at 03/08/18 1715  . sodium chloride        LOS: 3 days   Charlynne Cousins  [facility] Pager [phone]  *Please refer to [email or web address], password [email or web address] to get updated schedule on who will round on this patient, as hospitalists switch teams weekly. If 7PM-7AM, please contact night-coverage at [email or web address], password [email or web address] for any overnight needs.  03/10/2018, 9:58 AM

## 2018-03-10 NOTE — Discharge Summary (Signed)
Physician Discharge Summary  Aricka Goldberger [id number]:[id number] DOB: [birth year] DOA: 03/07/2018  PCP: Allie Dimmer, MD  Admit date: 03/07/2018 Discharge date: 03/10/2018  Admitted From: home Disposition:  Home  Recommendations for Outpatient Follow-up:  1. Follow up with PCP in 1-2 weeks 2. Please obtain BMP/CBC in one week   Home Health: [facility] Equipment/Devices:none   Discharge Condition:stable CODE STATUS:full Diet recommendation: Heart Healthy  Brief/Interim Summary: 84 y.o. female past medical history significant for atrial fibrillation on Xarelto presents with one-month history of shortness of breath, was found to have a hemoglobin of 8.5 in the ED  Discharge Diagnoses:  Active Problems:   Persistent atrial fibrillation (HCC)   Essential hypertension   GI bleed   AKI (acute kidney injury) ([place])   Acute blood loss anemia  Blood loss anemia of unknown source/GI bleed: On admission her FOBT was positive due to her drop in the hemoglobin new onset weakness and shortness of breath and the fact that her hemoglobin wnet from 12-8 she was admitted to the hospital, GI was consulted to perform an EGD, colonoscopy and a capsule endoscopy which were all unremarkable.  With no source of bleeding identified.  Next Physical therapy was consulted recommended home health PT. GI recommended to restart her Xarelto. Anemia panel was checked that showed a ferritin of 7 she was given IV iron in place and iron supplementation 3 times a day with MiraLAX for constipation.  Persistent atrial fibrillation: She remained rate control her Xarelto was held for 2 days it could be resumed as an outpatient per GI recommendations.  Essential hypertension: No changes were made to her medication.  Acute kidney injury: Likely prerenal in etiology resolved with IV fluid hydration.   Discharge Instructions  Discharge Instructions    Diet - low sodium heart healthy   Complete by:  As directed    Increase  activity slowly   Complete by:  As directed      Allergies as of 03/10/2018      Reactions   Aspirin Anaphylaxis, Swelling   Throat Throat   Other Anaphylaxis, Swelling   Walnuts, throat    Strawberry Extract Anaphylaxis   Codeine Other (See Comments)   hallucinations   Enalapril Other (See Comments)   Cough, low blood pressure   Melatonin Nausea Only   Penicillin G Other (See Comments)   Does not help symptoms   Shellfish Allergy    Other reaction(s): Other (See Comments)   Shellfish-derived Products Other (See Comments)   From allergy testing, IVP dye is fine   Latex Rash   reddness      Medication List    TAKE these medications   acetaminophen 500 MG tablet Commonly known as:  TYLENOL Take 500 mg by mouth 2 (two) times daily as needed for mild pain.   amiodarone 200 MG tablet Commonly known as:  PACERONE Take 1 tablet (200 mg total) by mouth daily.   atorvastatin 20 MG tablet Commonly known as:  LIPITOR Take 1 tablet by mouth daily.   CENTRUM PO Take 1 tablet by mouth daily. Not taking on Wed and Sun   cetirizine 10 MG tablet Commonly known as:  ZYRTEC Take 10 mg by mouth daily.   diazepam 5 MG tablet Commonly known as:  VALIUM Take 5 mg by mouth daily as needed for anxiety.   diltiazem 120 MG tablet Commonly known as:  CARDIZEM Take 120 mg by mouth 1 day or 1 dose.   estradiol 0.1 MG/GM vaginal cream Commonly  known as:  ESTRACE Place 1 Applicatorful vaginally 2 (two) times a week. Wed and Sun   ferrous sulfate 325 (65 FE) MG tablet Take 1 tablet (325 mg total) by mouth 3 (three) times daily with meals.   hydrALAZINE 25 MG tablet Commonly known as:  APRESOLINE Take 1 tablet by mouth 3 (three) times daily.   hydrALAZINE 10 MG tablet Commonly known as:  APRESOLINE Take 10 mg by mouth 3 (three) times daily as needed (high blood pressure).   losartan 50 MG tablet Commonly known as:  COZAAR TAKE 1 TABLET PO DAILY   polyethylene glycol  packet Commonly known as:  MIRALAX / GLYCOLAX Take 17 g by mouth daily.   PROBIOTIC FORMULA Caps Take 1 capsule by mouth daily as needed (prevent infection).   ranitidine 150 MG tablet Commonly known as:  ZANTAC Take 150 mg by mouth every evening.   Rivaroxaban 15 MG Tabs tablet Commonly known as:  XARELTO Take 1 tablet (15 mg total) by mouth daily with supper.   VITAMIN D3 SUPER STRENGTH 2000 units Tabs Generic drug:  Cholecalciferol Take 1 tablet by mouth daily.       Allergies  Allergen Reactions  . Aspirin Anaphylaxis and Swelling    Throat Throat  . Other Anaphylaxis and Swelling    Walnuts, throat   . Strawberry Extract Anaphylaxis  . Codeine Other (See Comments)    hallucinations  . Enalapril Other (See Comments)    Cough, low blood pressure  . Melatonin Nausea Only  . Penicillin G Other (See Comments)    Does not help symptoms  . Shellfish Allergy     Other reaction(s): Other (See Comments)  . Shellfish-Derived Products Other (See Comments)    From allergy testing, IVP dye is fine  . Latex Rash    reddness    Consultations:  [facility]   Procedures/Studies: Dg Chest 2 View  Result Date: 03/07/2018 CLINICAL DATA:  Cough, shortness of breath for 2 weeks. History of hypertension, atrial fibrillation. EXAM: CHEST - 2 VIEW COMPARISON:  Chest radiograph March 06, 2016 FINDINGS: Cardiac silhouette is mildly enlarged and unchanged. Mildly calcified aortic knob. Pectus excavatum blurring RIGHT heart border. Faint nodular density RIGHT. LEFT lung base strandy densities and pleural thickening without pleural effusion. No pneumothorax. Soft tissue planes and included osseous structures are nonsuspicious. IMPRESSION: Faint nodular density projecting RIGHT lung base can reflect sub position of vascular shadows, atelectasis, pneumonia or, external to the lungs .LEFT lung base atelectasis/scarring. Stable cardiomegaly. Aortic Atherosclerosis (ICD10-I70.0). Electronically  Signed   By: Elon Alas M.D.   On: 03/07/2018 13:59      Subjective: She relates she feels back to baseline and has had no further black or red stools.  Discharge Exam: Vitals:   03/10/18 0920 03/10/18 1359  BP: (!) 166/51 (!) 143/50  Pulse: 77 76  Resp: 18 12  Temp: 97.9 F (36.6 C) 98.4 F (36.9 C)  SpO2: 93% 95%   Vitals:   03/09/18 2055 03/10/18 0505 03/10/18 0920 03/10/18 1359  BP: (!) 167/55 (!) 138/50 (!) 166/51 (!) 143/50  Pulse: 82 71 77 76  Resp: 17 (!) 22 18 12   Temp: 98.7 F (37.1 C) 98.9 F (37.2 C) 97.9 F (36.6 C) 98.4 F (36.9 C)  TempSrc:  Oral Oral Oral  SpO2: 98% 94% 93% 95%  Weight:      Height:        General: Pt is alert, awake, not in acute distress Cardiovascular: RRR, S1/S2 +,  no rubs, no gallops Respiratory: CTA bilaterally, no wheezing, no rhonchi Abdominal: Soft, NT, ND, bowel sounds + Extremities: no edema, no cyanosis    The results of significant diagnostics from this hospitalization (including imaging, microbiology, ancillary and laboratory) are listed below for reference.     Microbiology: No results found for this or any previous visit (from the past 240 hour(s)).   Labs: BNP (last 3 results) Recent Labs    03/07/18 1410  BNP 300.9*   Basic Metabolic Panel: Recent Labs  Lab 03/07/18 1410  NA 135  K 4.0  CL 103  CO2 23  GLUCOSE 148*  BUN 19  CREATININE 1.20*  CALCIUM 8.6*   Liver Function Tests: Recent Labs  Lab 03/07/18 1410  AST 36  ALT 26  ALKPHOS 63  BILITOT 0.5  PROT 6.8  ALBUMIN 3.6   No results for input(s): LIPASE, AMYLASE in the last 168 hours. No results for input(s): AMMONIA in the last 168 hours. CBC: Recent Labs  Lab 03/07/18 1410  03/08/18 0413 03/08/18 1530 03/08/18 2008 03/09/18 0513 03/10/18 1327  WBC 9.4  --  8.3  --   --   --   --   NEUTROABS 6.1  --   --   --   --   --   --   HGB 8.8*   < > 7.6* 9.3* 9.2* 9.1* 8.8*  HCT 28.8*  --  24.8* 30.7*  30.2*  --   --   28.9*  MCV 79.1  --  79.5  --   --   --   --   PLT 301  --  284  --   --   --   --    < > = values in this interval not displayed.   Cardiac Enzymes: Recent Labs  Lab 03/07/18 1410  TROPONINI <0.03   BNP: Invalid input(s): POCBNP CBG: No results for input(s): GLUCAP in the last 168 hours. D-Dimer No results for input(s): DDIMER in the last 72 hours. Hgb A1c No results for input(s): HGBA1C in the last 72 hours. Lipid Profile No results for input(s): CHOL, HDL, LDLCALC, TRIG, CHOLHDL, LDLDIRECT in the last 72 hours. Thyroid function studies No results for input(s): TSH, T4TOTAL, T3FREE, THYROIDAB in the last 72 hours.  Invalid input(s): FREET3 Anemia work up Recent Labs    03/08/18 1530  FERRITIN 7*  TIBC 484*  IRON 30  RETICCTPCT 1.4   Urinalysis    Component Value Date/Time   COLORURINE YELLOW 03/07/2018 1420   APPEARANCEUR HAZY (A) 03/07/2018 1420   LABSPEC 1.015 03/07/2018 1420   PHURINE 6.0 03/07/2018 1420   GLUCOSEU NEGATIVE 03/07/2018 1420   HGBUR NEGATIVE 03/07/2018 1420   BILIRUBINUR NEGATIVE 03/07/2018 1420   KETONESUR 15 (A) 03/07/2018 1420   PROTEINUR NEGATIVE 03/07/2018 1420   NITRITE NEGATIVE 03/07/2018 1420   LEUKOCYTESUR LARGE (A) 03/07/2018 1420   Sepsis Labs Invalid input(s): PROCALCITONIN,  WBC,  LACTICIDVEN Microbiology No results found for this or any previous visit (from the past 240 hour(s)).   Time coordinating discharge: 35 minutes  SIGNED:   Charlynne Cousins, MD  [facility] 03/10/2018, 2:20 PM Pager   If 7PM-7AM, please contact night-coverage [email or web address] Password [email or web address]

## 2018-03-10 NOTE — Care Management Important Message (Signed)
Important Message  Patient Details  Name: Sarah Armstrong MRN: [id number] Date of Birth: 09-25-1931   Medicare Important Message Given:  Yes    Kerin Salen 03/10/2018, 10:40 AMImportant Message  Patient Details  Name: Sarah Armstrong MRN: [id number] Date of Birth: 07-24-1931   Medicare Important Message Given:  Yes    Kerin Salen 03/10/2018, 10:40 AM

## 2018-03-10 NOTE — Progress Notes (Signed)
Patient to be discharged home today. Went over discharge instructions with patient and daughter. IV was removed. Prescriptions given to patient. Patient aware of the need to follow-up.

## 2018-03-10 NOTE — Progress Notes (Signed)
Small bowel pill cam study was performed for severe symptomatic anemia, FOBT positive stool and iron deficiency. EGD and colonoscopy were unremarkable. Duodenal biopsies were unremarkable for celiac disease.  Findings: The PillCam was noted at the GE junction on to almost 2 hours into the study, exited the stomach into the small bowel at 3 hours and 33 minutes, rapid transit was noted into the cecum at 4 hours and 32 minutes.  Mucosa of the entire small bowel appeared unremarkable. There was no evidence of active or recent bleeding, AVMs, ulceration or significant lesion.   Recommendations: Unremarkable small bowel study. Review the possibilities of other causes of iron deficiency as no obvious GI source noted on EGD, capsule endoscopy and colonoscopy. Monitor H&H and transfuse as needed. Continue iron supplementation.  Okay to resume diet. Okay to start anticoagulation as needed. Okay to discharge from GI standpoint, discussed with patient's hospitalist-Feliz Marguarite Arbour, MD  Ronnette Juniper, M.D.

## 2018-03-10 NOTE — Progress Notes (Signed)
Home health orders received and choice offered for home health services. [facility]. Information faxed to [phone]). Marney Doctor RN,BSN,NCM [phone]

## 2018-03-12 LAB — VITAMIN B1: Vitamin B1 (Thiamine): 133.8 nmol/L (ref 66.5–200.0)

## 2018-03-23 ENCOUNTER — Other Ambulatory Visit (HOSPITAL_COMMUNITY): Payer: Self-pay | Admitting: Nurse Practitioner

## 2018-03-25 IMAGING — US US RENAL
1 series · 14 of 25 positions shown · non-contrast
Comparison: None available

CLINICAL DATA: Hypertension

EXAM:
RENAL/URINARY TRACT ULTRASOUND
RENAL DUPLEX DOPPLER ULTRASOUND

[Series 1: us renal · 0.22mm/px · 14 of 57 slices shown]
[im 1/57]
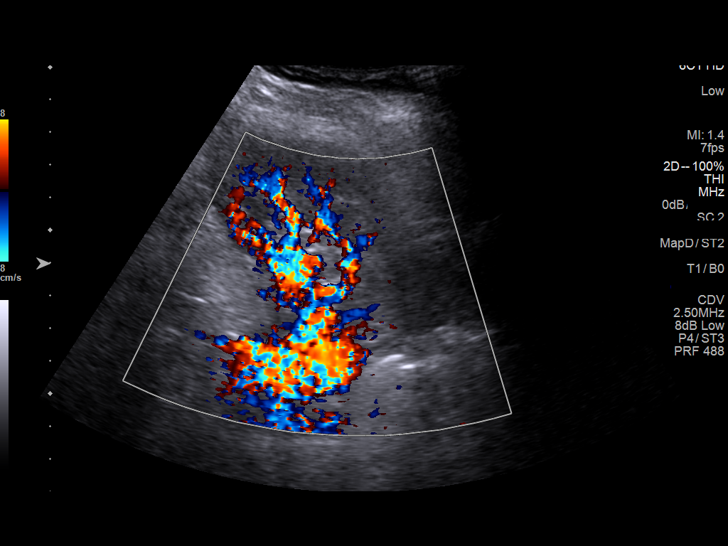
[im 5/57]
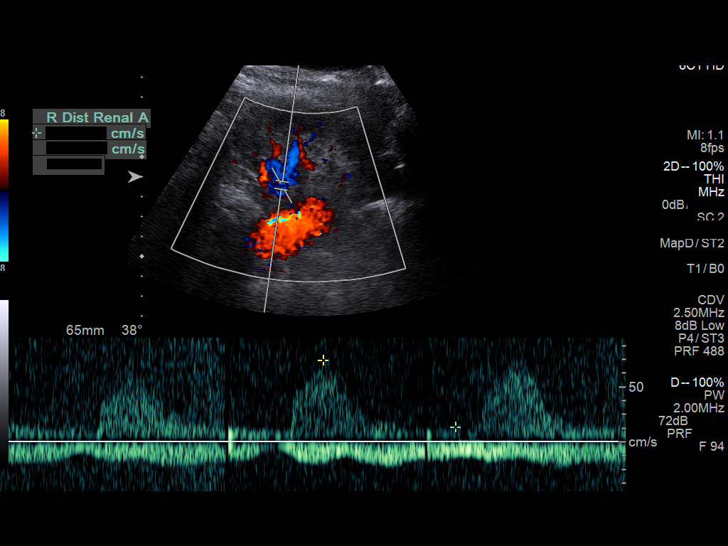
[im 10/57]
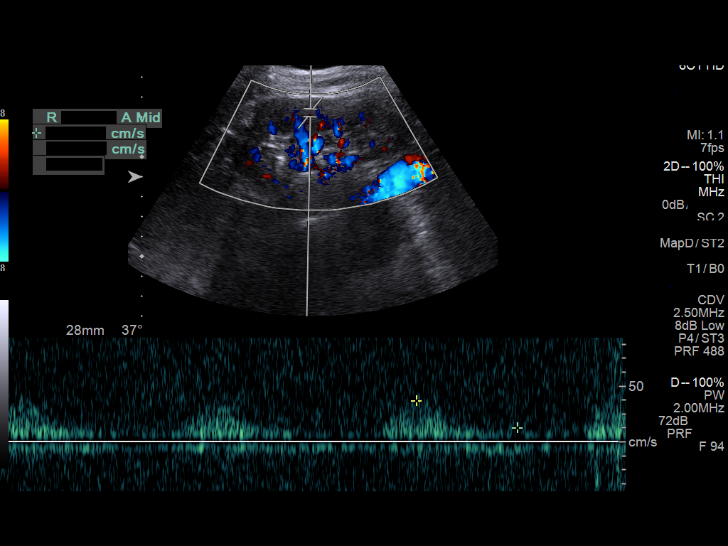
[im 15/57]
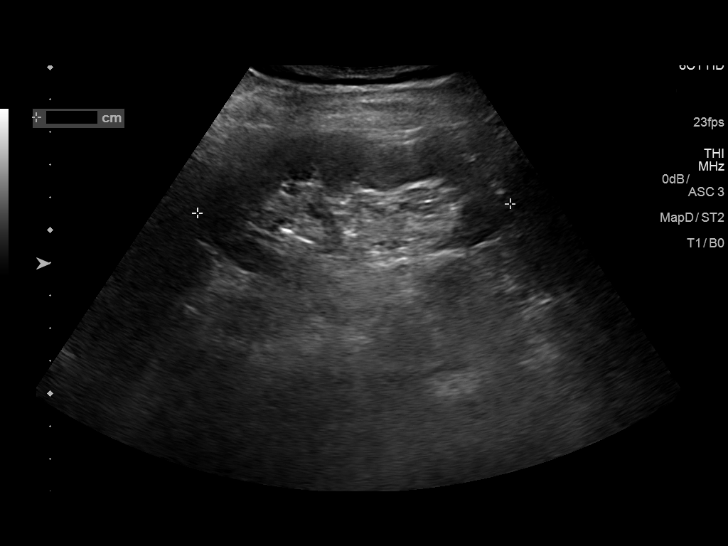
[im 19/57]
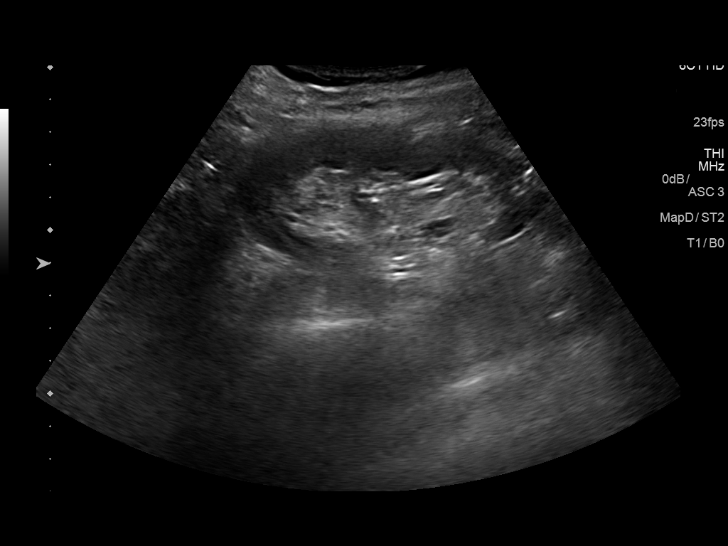
[im 22/57]
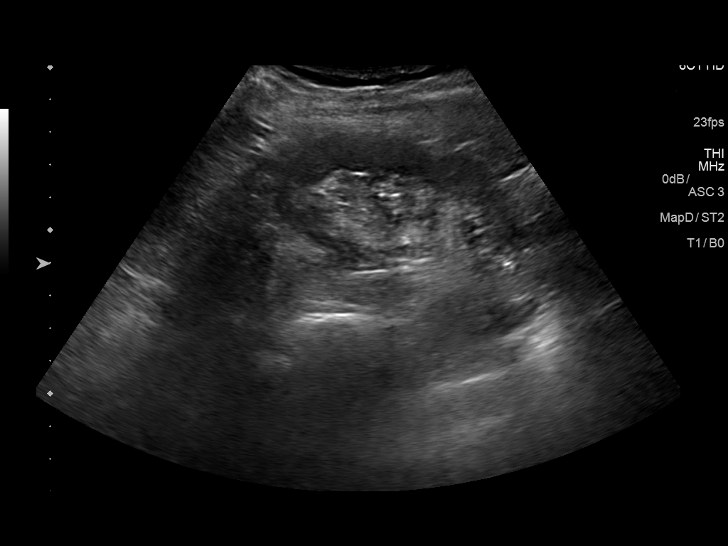
[im 26/57]
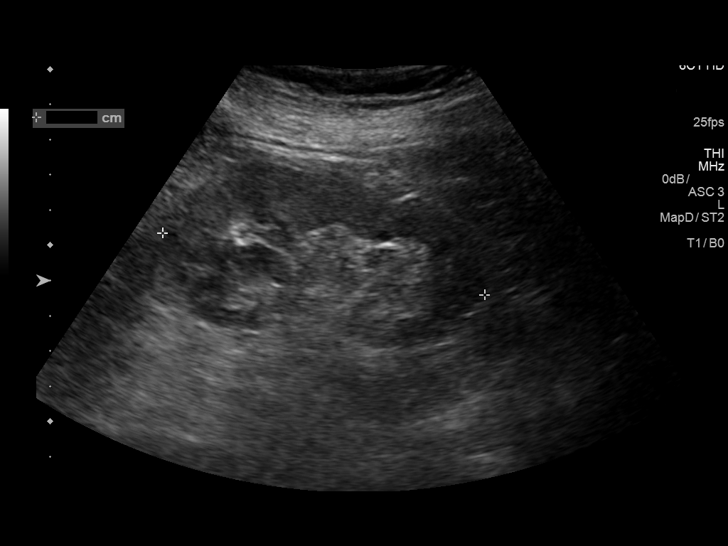
[im 31/57]
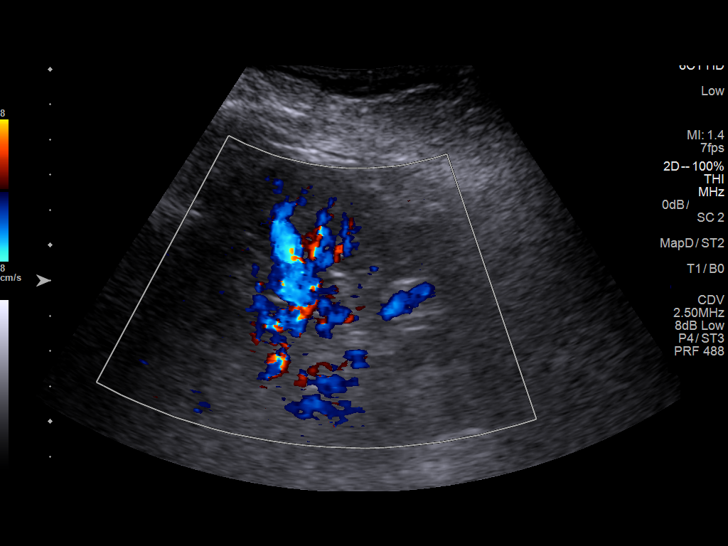
[im 36/57]
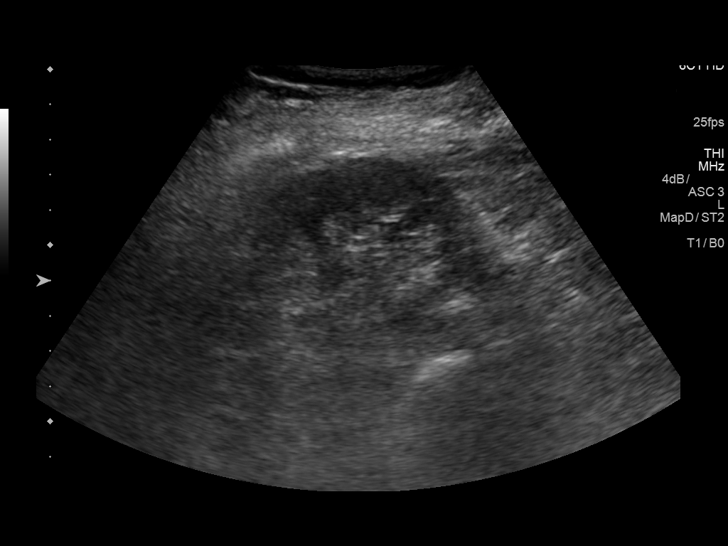
[im 38/57]
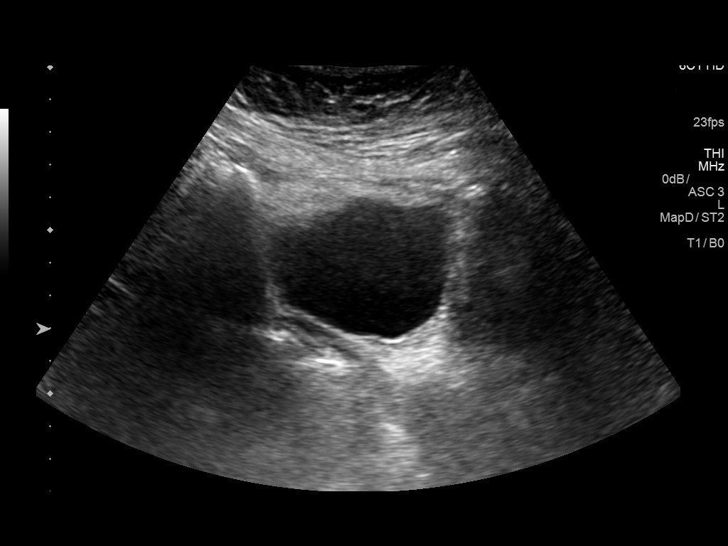
[im 43/57]
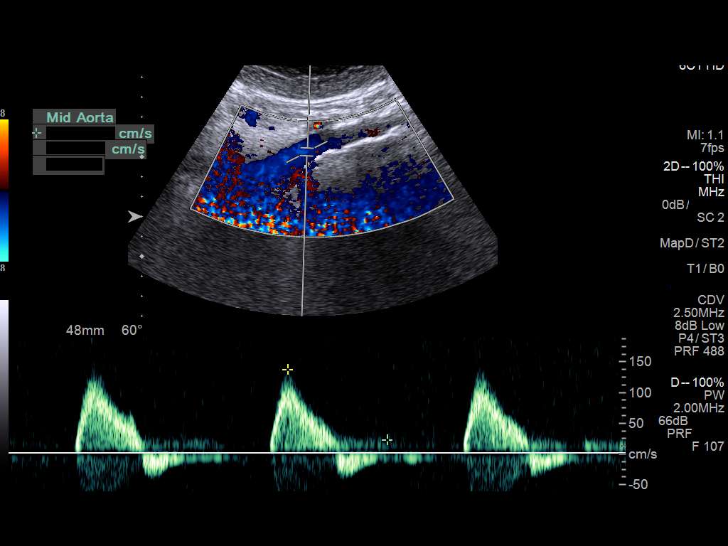
[im 47/57]
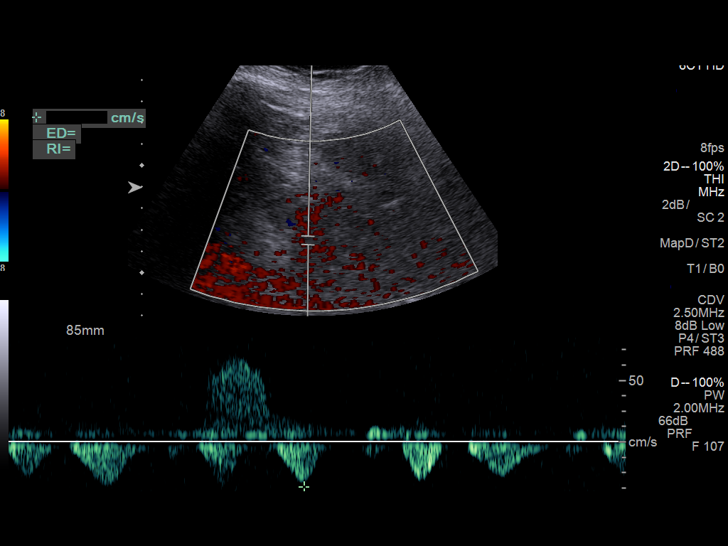
[im 52/57]
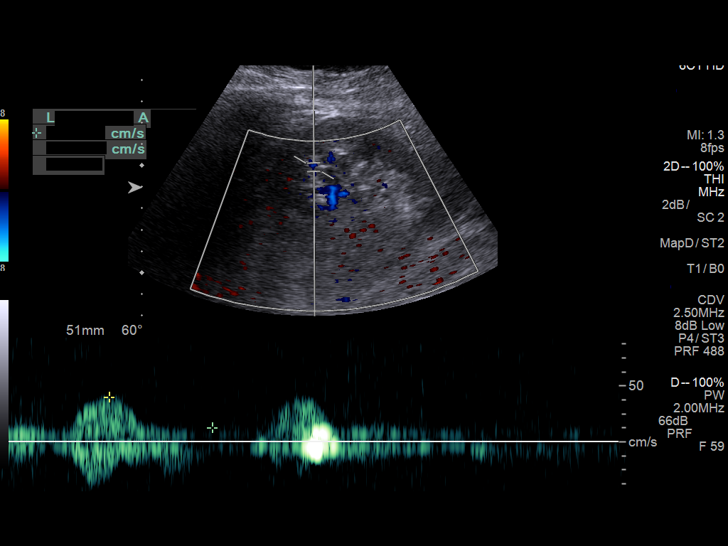
[im 57/57]
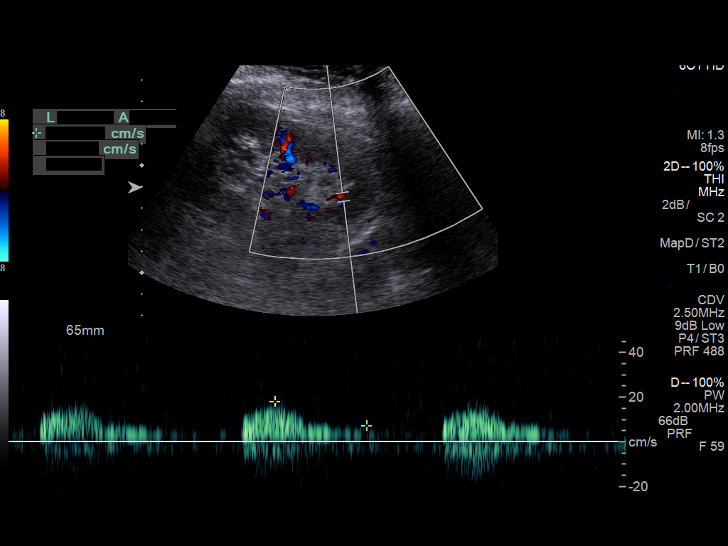

[14 of 25 positions shown; findings below may reference images not displayed]

FINDINGS: Right Kidney:

Length: 9.6 cm. Echogenicity within normal limits. No mass or
hydronephrosis visualized.

Left Kidney:

Length: 10.3 cm. Echogenicity begin normal limits. No significant
mass or hydronephrosis. Right upper pole hypoechoic parapelvic cysts
suspected.

Bladder:  Unremarkable

RENAL DUPLEX ULTRASOUND

Right Renal Artery Velocities:

Origin:  76 cm/sec

Mid:  64 cm/sec

Hilum:  74 cm/sec

Interlobar:  47 cm/sec

Arcuate:  35 Cm/sec

Left Renal Artery Velocities:

Origin:  68 cm/sec

Mid:  84 cm/sec

Hilum:  70 cm/sec

Interlobar:  48 cm/sec

Arcuate:  23 cm/sec

Aortic Velocity:  138 Cm/sec

Right Renal-Aortic Ratios:

Origin:

Mid:

Hilum:

Interlobar:

Arcuate:

Left Renal-Aortic Ratios:

Origin:

Mid:

Hilum:

Interlobar:

Arcuate:

No evidence of renal artery stenosis by ultrasound criteria. Renal
veins appear patent. No acute finding by renal ultrasound.
IMPRESSION: Negative for renal artery stenosis. No acute finding by renal
ultrasound.

## 2018-04-07 ENCOUNTER — Ambulatory Visit: Payer: Medicare Other | Admitting: Cardiology

## 2018-04-10 ENCOUNTER — Ambulatory Visit: Payer: Medicare Other | Admitting: Cardiology

## 2018-04-12 NOTE — Progress Notes (Signed)
Cardiology Office Note   Date:  04/13/2018   ID:  Sarah Armstrong, DOB 06-23-1931, MRN [id number] atrial f  PCP:  Allie Dimmer, MD  Cardiologist:   Minus Breeding, MD   Chief Complaint  Patient presents with  . Atrial Fibrillation     History of Present Illness: Sarah Armstrong is a 84 y.o. female who presents for evaluation of HTN.  I have seen her for atrial fibrillation. She was found to have this in January of 2017.  She had a renal ultrasound that was normal and negative metanephrines.   Since I last saw her she was in the hospital for treatment of GI bleed.  I reviewed these records for this visit.   Xarelto was held.  She had no source of bleeding identified.  She had AKI.    She has been back on Xarelto and he has had no obvious bleeding although her stools are black because of iron.  She is going to have her hemoglobin followed closely.  She is not having any acute symptoms.  In particular she does not feel palpitations, presyncope or syncope.  She is not having any chest pressure, neck or arm discomfort.  She thinks her blood pressure is well controlled.  She has had no further weight gain or edema.  Past Medical History:  Diagnosis Date  . Atrial fibrillation ([place])   . Cancer ([place]) skin  . GERD (gastroesophageal reflux disease)   . Hyperlipidemia   . Hypertension   . Stroke [facility])     Past Surgical History:  Procedure Laterality Date  . CARDIOVERSION N/A 03/04/2016   Procedure: CARDIOVERSION;  Surgeon: Sueanne Margarita, MD;  Location: [place];  Service: Cardiovascular;  Laterality: N/A;  . CATARACT EXTRACTION  2015  . COLONOSCOPY  2009  . COLONOSCOPY WITH PROPOFOL N/A 03/09/2018   Procedure: COLONOSCOPY WITH PROPOFOL;  Surgeon: Ronald Lobo, MD;  Location: [facility];  Service: Endoscopy;  Laterality: N/A;  . ESOPHAGOGASTRODUODENOSCOPY (EGD) WITH PROPOFOL N/A 03/08/2018   Procedure: ESOPHAGOGASTRODUODENOSCOPY (EGD) WITH PROPOFOL;  Surgeon: Ronald Lobo, MD;   Location: [facility];  Service: Endoscopy;  Laterality: N/A;  . GIVENS CAPSULE STUDY N/A 03/09/2018   Procedure: GIVENS CAPSULE STUDY;  Surgeon: Ronald Lobo, MD;  Location[facility];  Service: Endoscopy;  Laterality: N/A;  . TONSILLECTOMY  1953  . TOTAL ABDOMINAL HYSTERECTOMY  1983     Current Outpatient Medications  Medication Sig Dispense Refill  . acetaminophen (TYLENOL) 500 MG tablet Take 500 mg by mouth 2 (two) times daily as needed for mild pain.     Marland Kitchen amiodarone (PACERONE) 200 MG tablet TAKE 1 TABLET DAILY 90 tablet 3  . atorvastatin (LIPITOR) 20 MG tablet Take 1 tablet by mouth daily.    . cetirizine (ZYRTEC) 10 MG tablet Take 10 mg by mouth daily.    . Cholecalciferol (VITAMIN D3 SUPER STRENGTH) 2000 units TABS Take 1 tablet by mouth daily.    . diazepam (VALIUM) 5 MG tablet Take 5 mg by mouth daily as needed for anxiety.    Marland Kitchen diltiazem (CARDIZEM) 120 MG tablet Take 120 mg by mouth 1 day or 1 dose.     . estradiol (ESTRACE) 0.1 MG/GM vaginal cream Place 1 Applicatorful vaginally 2 (two) times a week. Wed and Sun    . ferrous sulfate 325 (65 FE) MG tablet Take 1 tablet (325 mg total) by mouth 3 (three) times daily with meals. 90 tablet 3  . hydrALAZINE (APRESOLINE) 10 MG tablet Take 10  mg by mouth 3 (three) times daily as needed (high blood pressure).     . hydrALAZINE (APRESOLINE) 25 MG tablet Take 1 tablet by mouth 3 (three) times daily.     Marland Kitchen losartan (COZAAR) 50 MG tablet TAKE 1 TABLET PO DAILY    . Multiple Vitamins-Minerals (CENTRUM PO) Take 1 tablet by mouth daily. Not taking on Wed and Sun    . polyethylene glycol (MIRALAX / GLYCOLAX) packet Take 17 g by mouth daily. 14 each 0  . Probiotic Product (PROBIOTIC FORMULA) CAPS Take 1 capsule by mouth daily as needed (prevent infection).     . ranitidine (ZANTAC) 150 MG tablet Take 150 mg by mouth every evening.    . rivaroxaban (XARELTO) 15 MG TABS tablet Take 1 tablet (15 mg total) by mouth daily with supper. 90 tablet  3   No current facility-administered medications for this visit.     Allergies:   Aspirin; Other; Strawberry extract; Codeine; Enalapril; Melatonin; Penicillin g; Shellfish allergy; Shellfish-derived products; and Latex    ROS:  Please see the history of present illness.   Otherwise, review of systems are positive twitching of her left leg at night..   All other systems are reviewed and negative.    PHYSICAL EXAM: VS:  BP (!) 152/66 (BP Location: Left Arm, Patient Position: Sitting, Cuff Size: Normal)   Pulse 80   Ht 5\' 7"  (1.702 m)   Wt 171 lb (77.6 kg)   BMI 26.78 kg/m  , BMI Body mass index is 26.78 kg/m.  GENERAL:  Well appearing NECK:  No jugular venous distention, waveform within normal limits, carotid upstroke brisk and symmetric, right bruits, no thyromegaly LUNGS:  Clear to auscultation bilaterally CHEST:  Unremarkable HEART:  PMI not displaced or sustained,S1 and S2 within normal limits, no S3, no S4, no clicks, no rubs, no murmurs ABD:  Flat, positive bowel sounds normal in frequency in pitch, no bruits, no rebound, no guarding, no midline pulsatile mass, no hepatomegaly, no splenomegaly EXT:  2 plus pulses throughout, no edema, no cyanosis no clubbing    EKG:  EKG is not ordered today. NA   Recent Labs: 03/07/2018: ALT 26; B Natriuretic Peptide 188.3; BUN 19; Creatinine, Ser 1.20; Potassium 4.0; Sodium 135 03/08/2018: Platelets 284 03/10/2018: Hemoglobin 8.8    Lipid Panel No results found for: CHOL, TRIG, HDL, CHOLHDL, VLDL, LDLCALC, LDLDIRECT    Wt Readings from Last 3 Encounters:  04/13/18 171 lb (77.6 kg)  03/09/18 168 lb (76.2 kg)  02/24/18 171 lb 3.2 oz (77.7 kg)      Other studies Reviewed: Additional studies/ records that were reviewed today include:   Labs Review of the above records demonstrates:       ASSESSMENT AND PLAN:   ATRIAL FIB:  Sarah Armstrong has a CHA2DS2 - VASc score of 4 with a risk of stroke of 4%.    I had another long  discussion with her about the fact that I am trying to control her rhythm and rate if she goes into fibrillation but that she still needs the anticoagulation.  She seems to understand this.  AKI:  This was noted in the hospital.  However, I see that her creatinine is now fairly well-preserved.  However, because of fluctuating creatinine I have decided to use a lower dose of the anticoagulant.  HTN:   The blood pressure is at target. No change in medications is indicated. We will continue with therapeutic lifestyle changes (TLC).  MR/TR:  This was mild and I will follow this clinically.   WEAKNESS:   She does not sleep well.  This is likely to explain some of her problem.  I did stop the beta-blocker previously and this might of helped.  No further work-up is planned.   DYSLIPIDEMIA:    She will continue the meds as listed.  Her LDL was 80.  HDL was 67.  BRUIT:   She had carotid Dopplers today and I will check for the final results.  Hospital records reviewed for this appt.     Current medicines are reviewed at length with the patient today.  The patient does not have concerns regarding medicines.  The following changes have been made:   None  Labs/ tests ordered today include:  None  No orders of the defined types were placed in this encounter.    Disposition:   FU with me 6 months.  Signed, Minus Breeding, MD  04/13/2018 6:08 PM    [place]

## 2018-04-13 ENCOUNTER — Ambulatory Visit (INDEPENDENT_AMBULATORY_CARE_PROVIDER_SITE_OTHER): Payer: Medicare Other | Admitting: Cardiology

## 2018-04-13 ENCOUNTER — Encounter: Payer: Self-pay | Admitting: Cardiology

## 2018-04-13 ENCOUNTER — Ambulatory Visit (HOSPITAL_COMMUNITY)
Admission: RE | Admit: 2018-04-13 | Discharge: 2018-04-13 | Disposition: A | Discharge status: Home / Self Care | Payer: Medicare Other | Source: Ambulatory Visit | Attending: Cardiology | Admitting: Cardiology

## 2018-04-13 ENCOUNTER — Encounter (HOSPITAL_COMMUNITY): Payer: Medicare Other

## 2018-04-13 VITALS — BP 152/66 | HR 80 | Ht 67.0 in | Wt 171.0 lb

## 2018-04-13 DIAGNOSIS — E785 Hyperlipidemia, unspecified: Secondary | ICD-10-CM | POA: Insufficient documentation

## 2018-04-13 DIAGNOSIS — I10 Essential (primary) hypertension: Secondary | ICD-10-CM

## 2018-04-13 DIAGNOSIS — R531 Weakness: Secondary | ICD-10-CM | POA: Diagnosis not present

## 2018-04-13 DIAGNOSIS — Z8673 Personal history of transient ischemic attack (TIA), and cerebral infarction without residual deficits: Secondary | ICD-10-CM | POA: Insufficient documentation

## 2018-04-13 DIAGNOSIS — I6523 Occlusion and stenosis of bilateral carotid arteries: Secondary | ICD-10-CM | POA: Insufficient documentation

## 2018-04-13 DIAGNOSIS — K922 Gastrointestinal hemorrhage, unspecified: Secondary | ICD-10-CM | POA: Diagnosis not present

## 2018-04-13 DIAGNOSIS — I779 Disorder of arteries and arterioles, unspecified: Secondary | ICD-10-CM | POA: Diagnosis present

## 2018-04-13 DIAGNOSIS — I480 Paroxysmal atrial fibrillation: Secondary | ICD-10-CM

## 2018-04-13 NOTE — Patient Instructions (Signed)

## 2018-04-21 ENCOUNTER — Telehealth: Payer: Self-pay | Admitting: General Practice

## 2018-04-21 ENCOUNTER — Telehealth: Payer: Self-pay | Admitting: Cardiology

## 2018-04-21 NOTE — Telephone Encounter (Signed)
Pt is calling   Stating she want to know her Carotid test results from last week

## 2018-04-21 NOTE — Telephone Encounter (Signed)
Not needed - error

## 2018-04-21 NOTE — Telephone Encounter (Signed)
Pt calling in wanting to know the result of her Carotid test. Pt informed that Dr, Warren Lacy has to review the result and inter pert them, then he will have his nurse contact her back with results. Pt verbalized understanding.

## 2018-04-25 NOTE — Telephone Encounter (Signed)
Pt made aware of her carotid doppler

## 2018-04-25 NOTE — Telephone Encounter (Signed)
Follow up      Calling to get carotid results

## 2018-04-25 NOTE — Telephone Encounter (Signed)
Returned call to patient advised carotid doppler results not available.Advised results were not sent to Dr.Hochrein due to a technical problem.Results will be sent to Dr.Hochrein.Advised we will call back after he reviews.

## 2018-08-29 ENCOUNTER — Other Ambulatory Visit: Payer: Self-pay | Admitting: Cardiology

## 2019-01-08 ENCOUNTER — Other Ambulatory Visit: Payer: Self-pay | Admitting: Cardiology

## 2019-03-15 ENCOUNTER — Other Ambulatory Visit (HOSPITAL_COMMUNITY): Payer: Self-pay | Admitting: Cardiology

## 2019-03-15 NOTE — Telephone Encounter (Signed)
pacerone refilled. 

## 2019-04-02 ENCOUNTER — Telehealth: Payer: Self-pay | Admitting: Cardiology

## 2019-04-02 NOTE — Telephone Encounter (Signed)
Appointment moved to 05/05

## 2019-04-02 NOTE — Telephone Encounter (Signed)
New Message   Please call patient  to get setup for virtual appointment

## 2019-04-02 NOTE — Progress Notes (Signed)
Virtual Visit via Telephone Note   This visit type was conducted due to national recommendations for restrictions regarding the COVID-19 Pandemic (e.g. social distancing) in an effort to limit this patient's exposure and mitigate transmission in our community.  Due to her co-morbid illnesses, this patient is at least at moderate risk for complications without adequate follow up.  This format is felt to be most appropriate for this patient at this time.  The patient did not have access to video technology/had technical difficulties with video requiring transitioning to audio format only (telephone).  All issues noted in this document were discussed and addressed.  No physical exam could be performed with this format.  Please refer to the patient's chart for her  consent to telehealth for [facility].   Date:  04/03/2019   ID:  [name], DOB Sep 12, 1931, MRN [id number]  Patient Location: Home Provider Location: Home  PCP:  Allie Dimmer, MD  Cardiologist:  Minus Breeding, MD  Electrophysiologist:  None   Evaluation Performed:  Follow-Up Visit  Chief Complaint:  Atrial fib   History of Present Illness:    Sarah Armstrong is a 85 y.o. female who presents for evaluation of HTN AND atrial fibrillation. She was found to have this in January of 2017.  She had a renal ultrasound that was normal and negative metanephrines.   She was in the hospital for treatment of GI bleed in 2019.     Since I last saw her she has done well.  The patient denies any new symptoms such as chest discomfort, neck or arm discomfort. There has been no new shortness of breath, PND or orthopnea. There have been no reported palpitations, presyncope or syncope.  She might at times feel her heart racing a little bit when she wakes up in the morning.  However, she says this settles down.  She taking her blood thinner not having any problems.  I reviewed labs and she is not anemic.  The patient does not have symptoms concerning  for COVID-19 infection (fever, chills, cough, or new shortness of breath).    Past Medical History:  Diagnosis Date  . Atrial fibrillation ([place])   . Cancer ([place]) skin  . GERD (gastroesophageal reflux disease)   . Hyperlipidemia   . Hypertension   . Stroke [facility])    Past Surgical History:  Procedure Laterality Date  . CARDIOVERSION N/A 03/04/2016   Procedure: CARDIOVERSION;  Surgeon: Sueanne Margarita, MD;  Location: [place];  Service: Cardiovascular;  Laterality: N/A;  . CATARACT EXTRACTION  2015  . COLONOSCOPY  2009  . COLONOSCOPY WITH PROPOFOL N/A 03/09/2018   Procedure: COLONOSCOPY WITH PROPOFOL;  Surgeon: Ronald Lobo, MD;  [facility];  Service: Endoscopy;  Laterality: N/A;  . ESOPHAGOGASTRODUODENOSCOPY (EGD) WITH PROPOFOL N/A 03/08/2018   Procedure: ESOPHAGOGASTRODUODENOSCOPY (EGD) WITH PROPOFOL;  Surgeon: Ronald Lobo, MD;  [facility];  Service: Endoscopy;  Laterality: N/A;  . GIVENS CAPSULE STUDY N/A 03/09/2018   Procedure: GIVENS CAPSULE STUDY;  Surgeon: Ronald Lobo, MD;  [facility];  Service: Endoscopy;  Laterality: N/A;  . TONSILLECTOMY  1953  . TOTAL ABDOMINAL HYSTERECTOMY  1983     Prior to Admission medications   Medication Sig Start Date End Date Taking? Authorizing Provider  acetaminophen (TYLENOL) 500 MG tablet Take 500 mg by mouth 2 (two) times daily as needed for mild pain.    Yes [provider]  amiodarone (PACERONE) 200 MG tablet TAKE 1 TABLET DAILY 03/15/19  Yes  Minus Breeding, MD  atorvastatin (LIPITOR) 20 MG tablet Take 1 tablet by mouth daily. 06/26/15  Yes [provider]  CARTIA XT 120 MG 24 hr capsule TAKE 1 CAPSULE DAILY 08/29/18  Yes Minus Breeding, MD  cetirizine (ZYRTEC) 10 MG tablet Take 10 mg by mouth daily.   Yes [provider]  Cholecalciferol (VITAMIN D3 SUPER STRENGTH) 2000 units TABS Take 1 tablet by mouth daily.   Yes [provider]  diazepam (VALIUM) 5 MG tablet  Take 5 mg by mouth daily as needed for anxiety.   Yes [provider]  diltiazem (CARDIZEM) 120 MG tablet Take 120 mg by mouth 1 day or 1 dose.    Yes [provider]  estradiol (ESTRACE) 0.1 MG/GM vaginal cream Place 1 Applicatorful vaginally 2 (two) times a week. Wed and Sun   Yes [provider]  ferrous sulfate 325 (65 FE) MG tablet Take 1 tablet (325 mg total) by mouth 3 (three) times daily with meals. 03/10/18  Yes Charlynne Cousins, MD  hydrALAZINE (APRESOLINE) 10 MG tablet Take 10 mg by mouth 3 (three) times daily as needed (high blood pressure).    Yes [provider]  hydrALAZINE (APRESOLINE) 25 MG tablet TAKE 1 TABLET THREE TIMES A DAY AND AS NEEDED 01/09/19  Yes Minus Breeding, MD  losartan (COZAAR) 50 MG tablet TAKE 1 TABLET PO DAILY 08/18/17  Yes [provider]  Multiple Vitamins-Minerals (CENTRUM PO) Take 1 tablet by mouth daily. Not taking on Wed and Sun   Yes [provider]  Probiotic Product (PROBIOTIC FORMULA) CAPS Take 1 capsule by mouth daily as needed (prevent infection).    Yes [provider]  rivaroxaban (XARELTO) 15 MG TABS tablet Take 1 tablet (15 mg total) by mouth daily with supper. 05/04/16  Yes Sherran Needs, NP  Cranberry (RA CRANBERRY) 500 MG CAPS Take by mouth 3 (three) times a week.    [provider]  famotidine (PEPCID) 20 MG tablet Take by mouth.    [provider]     Allergies:   Aspirin; Other; Strawberry extract; Codeine; Enalapril; Melatonin; Penicillin g; Shellfish allergy; Shellfish-derived products; and Latex   Social History   Tobacco Use  . Smoking status: Never Smoker  . Smokeless tobacco: Never Used  Substance Use Topics  . Alcohol use: No    Alcohol/week: 0.0 standard drinks  . Drug use: No     Family Hx: The patient's family history includes COPD in her sister; Cancer in her maternal grandfather; Diabetes in her child; Heart disease in her father; Heart  disease (age of onset: 34) in her brother and brother; Hypertension in her child and mother; Leukemia in her brother; Parkinson's disease in her sister; Stroke in her maternal grandmother.  ROS:   Please see the history of present illness.    As stated in the HPI and negative for all other systems.   Prior CV studies:   The following studies were reviewed today:  None  Labs/Other Tests and Data Reviewed:    EKG:  No ECG reviewed.  Recent Labs: No results found for requested labs within last 8760 hours.   Recent Lipid Panel No results found for: CHOL, TRIG, HDL, CHOLHDL, LDLCALC, LDLDIRECT  Wt Readings from Last 3 Encounters:  04/03/19 162 lb 12.8 oz (73.8 kg)  04/13/18 171 lb (77.6 kg)  03/09/18 168 lb (76.2 kg)     Objective:    Vital Signs:  BP (!) 155/53  Pulse 74   Ht 5\' 7"  (1.702 m)   Wt 162 lb 12.8 oz (73.8 kg)   BMI 25.50 kg/m    VITAL SIGNS:  reviewed  ASSESSMENT & PLAN:    ATRIAL FIB:  Ms. Luxe Cuadros has a CHA2DS2 - VASc score of 4 with a risk of stroke of 4%.   She is up-to-date with her amiodarone labs.  TSH and liver were normal.  Tolerating blood thinner.  Her last EKG was sinus rhythm and I suspect it still is but if not it is not symptomatic fibrillation.  I do not think any change in therapy is indicated.  AKI:   Creatinine was 1.06.  This was in April.  No change in therapy.  HTN:   The blood pressure is mildly elevated.  However, overall this is well controlled.  Her daughter is a Marine scientist who takes care of things so I would not change her medicines.   MR/TR:   This was mild and I will follow this clinically.  BRUIT:   She had no significant plaque on Doppler last year.  No further imaging.    COVID-19 Education: The signs and symptoms of COVID-19 were discussed with the patient and how to seek care for testing (follow up with PCP or arrange E-visit).  The importance of social distancing was discussed today.  Time:   Today, I have spent 16  minutes with the patient with telehealth technology discussing the above problems.     Medication Adjustments/Labs and Tests Ordered: Current medicines are reviewed at length with the patient today.  Concerns regarding medicines are outlined above.   Tests Ordered: No orders of the defined types were placed in this encounter.   Medication Changes: No orders of the defined types were placed in this encounter.   Disposition:  Follow up me in one year  Signed, Minus Breeding, MD  04/03/2019 11:15 AM    [place]

## 2019-04-03 ENCOUNTER — Encounter: Payer: Self-pay | Admitting: Cardiology

## 2019-04-03 ENCOUNTER — Telehealth (INDEPENDENT_AMBULATORY_CARE_PROVIDER_SITE_OTHER): Payer: Medicare Other | Admitting: Cardiology

## 2019-04-03 VITALS — BP 155/53 | HR 74 | Ht 67.0 in | Wt 162.8 lb

## 2019-04-03 DIAGNOSIS — Z7189 Other specified counseling: Secondary | ICD-10-CM | POA: Diagnosis not present

## 2019-04-03 DIAGNOSIS — I10 Essential (primary) hypertension: Secondary | ICD-10-CM | POA: Diagnosis not present

## 2019-04-03 DIAGNOSIS — I480 Paroxysmal atrial fibrillation: Secondary | ICD-10-CM

## 2019-04-03 NOTE — Patient Instructions (Signed)

## 2019-04-13 ENCOUNTER — Ambulatory Visit: Payer: Medicare Other | Admitting: Cardiology

## 2019-08-23 ENCOUNTER — Telehealth: Payer: Self-pay | Admitting: Cardiology

## 2019-08-23 NOTE — Telephone Encounter (Signed)
° °  Patient calling to SOB    Pt c/o Shortness Of Breath: STAT if SOB developed within the last 24 hours or pt is noticeably SOB on the phone  1. Are you currently SOB (can you hear that pt is SOB on the phone)? no  2. How long have you been experiencing SOB? 3-4 months  3. Are you SOB when sitting or when up moving around? moving around  4. Are you currently experiencing any other symptoms? no

## 2019-08-25 NOTE — Progress Notes (Signed)
Cardiology Office Note   Date:  08/27/2019   ID:  Sarah Armstrong, DOB [birth year], MRN [id number] atrial f  PCP:  Allie Dimmer, MD  Cardiologist:   Minus Breeding, MD   Chief Complaint  Patient presents with  . Shortness of Breath     History of Present Illness: Sarah Armstrong is a 85 y.o. female who presents for evaluation of HTN and atrial fibrillation. She was found to have this in January of 2017. She had a renal ultrasound that was normal and negative metanephrines. She was in the hospital for treatment of GI bleed in 2019.     She called because she said her heart rate has been going up and she was having a little more shortness of breath.  This appointment was made to discuss that.  She reports that her heart rate has been going up.  She will be relaxed and then get up and do a little bit around the house in the morning and her heart rate will go up to 95.  She sits down and it starts to go back down into the 70s.  She will get a little bit short of breath with this activity.  However, she knows that not atrial fibrillation.  She can feel this.  She may have some fluctuations of her blood pressure but it does not sound like these of the extremes and she is not having any presyncope or syncope or sustained hypertension.  She is not having any resting shortness of breath, PND or orthopnea.  She gets around.  Here in the office she uses a cane.  Past Medical History:  Diagnosis Date  . Atrial fibrillation ([place])   . Cancer ([place]) skin  . GERD (gastroesophageal reflux disease)   . Hyperlipidemia   . Hypertension   . Stroke [facility])     Past Surgical History:  Procedure Laterality Date  . CARDIOVERSION N/A 03/04/2016   Procedure: CARDIOVERSION;  Surgeon: Sueanne Margarita, MD;  Location: [place];  Service: Cardiovascular;  Laterality: N/A;  . CATARACT EXTRACTION  2015  . COLONOSCOPY  2009  . COLONOSCOPY WITH PROPOFOL N/A 03/09/2018   Procedure: COLONOSCOPY WITH PROPOFOL;  Surgeon:  Ronald Lobo, MD;  [facility];  Service: Endoscopy;  Laterality: N/A;  . ESOPHAGOGASTRODUODENOSCOPY (EGD) WITH PROPOFOL N/A 03/08/2018   Procedure: ESOPHAGOGASTRODUODENOSCOPY (EGD) WITH PROPOFOL;  Surgeon: Ronald Lobo, MD;  [facility];  Service: Endoscopy;  Laterality: N/A;  . GIVENS CAPSULE STUDY N/A 03/09/2018   Procedure: GIVENS CAPSULE STUDY;  Surgeon: Ronald Lobo, MD;  [facility];  Service: Endoscopy;  Laterality: N/A;  . TONSILLECTOMY  1953  . TOTAL ABDOMINAL HYSTERECTOMY  1983     Current Outpatient Medications  Medication Sig Dispense Refill  . acetaminophen (TYLENOL) 500 MG tablet Take 500 mg by mouth 2 (two) times daily as needed for mild pain.     Marland Kitchen amiodarone (PACERONE) 200 MG tablet TAKE 1 TABLET DAILY 90 tablet 1  . atorvastatin (LIPITOR) 20 MG tablet Take 1 tablet by mouth daily.    Marland Kitchen CARTIA XT 120 MG 24 hr capsule TAKE 1 CAPSULE DAILY 90 capsule 3  . cetirizine (ZYRTEC) 10 MG tablet Take 10 mg by mouth daily.    . Cholecalciferol (VITAMIN D3 SUPER STRENGTH) 2000 units TABS Take 1 tablet by mouth daily.    . Cranberry (RA CRANBERRY) 500 MG CAPS Take by mouth 3 (three) times a week.    . Cranberry 500 MG CAPS Take 500 mg  by mouth 3 (three) times a week.    . diazepam (VALIUM) 5 MG tablet Take 5 mg by mouth daily as needed for anxiety.    Marland Kitchen diltiazem (CARDIZEM) 120 MG tablet Take 120 mg by mouth 1 day or 1 dose.     . estradiol (ESTRACE) 0.1 MG/GM vaginal cream Place 1 Applicatorful vaginally 2 (two) times a week. Wed and Sun    . famotidine (PEPCID) 20 MG tablet Take by mouth.    . ferrous sulfate 325 (65 FE) MG tablet Take 1 tablet (325 mg total) by mouth 3 (three) times daily with meals. 90 tablet 3  . hydrALAZINE (APRESOLINE) 10 MG tablet Take 10 mg by mouth 3 (three) times daily as needed (high blood pressure).     . hydrALAZINE (APRESOLINE) 25 MG tablet TAKE 1 TABLET THREE TIMES A DAY AND AS NEEDED 270 tablet 4  . losartan  (COZAAR) 50 MG tablet TAKE 1 TABLET PO DAILY    . Multiple Vitamins-Minerals (CENTRUM PO) Take 1 tablet by mouth daily. Not taking on Wed and Sun    . Probiotic Product (PROBIOTIC FORMULA) CAPS Take 1 capsule by mouth daily as needed (prevent infection).     . rivaroxaban (XARELTO) 15 MG TABS tablet Take 1 tablet (15 mg total) by mouth daily with supper. 90 tablet 3   No current facility-administered medications for this visit.     Allergies:   Aspirin, Other, Strawberry extract, Codeine, Enalapril, Melatonin, Penicillin g, Shellfish allergy, Shellfish-derived products, and Latex    ROS:  Please see the history of present illness.   Otherwise, review of systems are positive twitching of her left leg at night..   All other systems are reviewed and negative.    PHYSICAL EXAM: VS:  BP (!) 138/54   Pulse 88   Ht 5\' 7"  (1.702 m)   Wt 164 lb (74.4 kg)   BMI 25.69 kg/m  , BMI Body mass index is 25.69 kg/m.  GENERAL:  Well appearing NECK:  No jugular venous distention, waveform within normal limits, carotid upstroke brisk and symmetric, right bruits, no thyromegaly LUNGS:  Clear to auscultation bilaterally CHEST:  Unremarkable HEART:  PMI not displaced or sustained,S1 and S2 within normal limits, no S3, no S4, no clicks, no rubs, no murmurs ABD:  Flat, positive bowel sounds normal in frequency in pitch, no bruits, no rebound, no guarding, no midline pulsatile mass, no hepatomegaly, no splenomegaly EXT:  2 plus pulses throughout, no edema, no cyanosis no clubbing    EKG:  EKG is  ordered today. Sinus rhythm, rate 88, right axis deviation, no acute ST-T wave changes.   Recent Labs: No results found for requested labs within last 8760 hours.    Lipid Panel No results found for: CHOL, TRIG, HDL, CHOLHDL, VLDL, LDLCALC, LDLDIRECT    Wt Readings from Last 3 Encounters:  08/27/19 164 lb (74.4 kg)  04/03/19 162 lb 12.8 oz (73.8 kg)  04/13/18 171 lb (77.6 kg)      Other studies  Reviewed: Additional studies/ records that were reviewed today include:   Labs Review of the above records demonstrates:    NA   ASSESSMENT AND PLAN:   ATRIAL FIB:  Sarah Armstrong has a CHA2DS2 - VASc score of 4 with a risk of stroke of 4%.    The patient  tolerates this rhythm and rate control and anticoagulation. We will continue with the meds as listed.  She has had no symptomatic paroxysms.  No  change in therapy is indicated.  Of note she is up-to-date with her TSH and liver enzymes.  AKI:   I did review her recent labs and she is up-to-date with these and has only very slightly elevated creatinine.  However, because it has fluctuated we are using a lower dose of her go back.  HTN:   The blood pressure is at target.  She tends to not take her hydralazine sometimes.  We talked about this and that she could avoid it if it is very very low but it does not sound like this is usually the case.  For the most part I think her blood pressure fluctuations are okay and I am not going to make any changes.   MR/TR:     This was mild previously.  To follow this clinically.  DYSLIPIDEMIA:    She will continue the meds as listed.  Her LDL was 80.  HDL was 67.  BRUIT:   She had mild plaque in May of last year.  No change in therapy.    DYSPNEA: She complained of dyspnea walking around the office her saturation stayed in the 93% range did not really fall.  No change in therapy.   Current medicines are reviewed at length with the patient today.  The patient does not have concerns regarding medicines.  The following changes have been made:   None  Labs/ tests ordered today include:  None  Orders Placed This Encounter  Procedures  . EKG 12-Lead     Disposition:   FU with me 12 months.  Signed, Minus Breeding, MD  08/27/2019 10:35 AM    [place]

## 2019-08-27 ENCOUNTER — Encounter: Payer: Self-pay | Admitting: Cardiology

## 2019-08-27 ENCOUNTER — Other Ambulatory Visit: Payer: Self-pay

## 2019-08-27 ENCOUNTER — Ambulatory Visit (INDEPENDENT_AMBULATORY_CARE_PROVIDER_SITE_OTHER): Payer: Medicare Other | Admitting: Cardiology

## 2019-08-27 VITALS — BP 138/54 | HR 88 | Ht 67.0 in | Wt 164.0 lb

## 2019-08-27 DIAGNOSIS — E785 Hyperlipidemia, unspecified: Secondary | ICD-10-CM

## 2019-08-27 DIAGNOSIS — I10 Essential (primary) hypertension: Secondary | ICD-10-CM

## 2019-08-27 DIAGNOSIS — I480 Paroxysmal atrial fibrillation: Secondary | ICD-10-CM

## 2019-08-27 NOTE — Patient Instructions (Signed)
Medication Instructions:  Your physician recommends that you continue on your current medications as directed. Please refer to the Current Medication list given to you today.  If you need a refill on your cardiac medications before your next appointment, please call your pharmacy.   Lab work: NONE If you have labs (blood work) drawn today and your tests are completely normal, you will receive your results only by: [place] (if you have MyChart) OR A paper copy in the mail If you have any lab test that is abnormal or we need to change your treatment, we will call you to review the results.  Testing/Procedures: NONE  Follow-Up: At [facility], you and your health needs are our priority.  As part of our continuing mission to provide you with exceptional heart care, we have created designated Provider Care Teams.  These Care Teams include your primary Cardiologist (physician) and Advanced Practice Providers (APPs -  Physician Assistants and Nurse Practitioners) who all work together to provide you with the care you need, when you need it. You will need a follow up appointment in 12 months.  Please call our office 2 months in advance to schedule this appointment.  You may see Minus Breeding, MD or one of the following Advanced Practice Providers on your designated Care Team:   Rosaria Ferries, PA-C Jory Sims, DNP, ANP

## 2019-09-10 ENCOUNTER — Other Ambulatory Visit (HOSPITAL_COMMUNITY): Payer: Self-pay | Admitting: Cardiology

## 2019-09-14 ENCOUNTER — Other Ambulatory Visit: Payer: Self-pay | Admitting: Cardiology

## 2019-12-10 ENCOUNTER — Telehealth: Payer: Self-pay | Admitting: Cardiology

## 2019-12-10 NOTE — Telephone Encounter (Signed)
   Primary Cardiologist: Minus Breeding, MD  Chart reviewed as part of pre-operative protocol coverage. Simple dental extractions are considered low risk procedures per guidelines and generally do not require any specific cardiac clearance. It is also generally accepted that for simple extractions and dental cleanings, there is no need to interrupt blood thinner therapy.   SBE prophylaxis is not required for the patient.  I will route this recommendation to the requesting party via Epic fax function and remove from pre-op pool.  Please call with questions.  Darreld Mclean, PA-C 12/10/2019, 3:52 PM

## 2019-12-10 NOTE — Telephone Encounter (Signed)
For one tooth extraction, she should not need to hold her blood thinner. I have sent this recommendation and the completed pre-op form to her Dentist. Do mind just updating patient as well?  Thank you!

## 2019-12-10 NOTE — Telephone Encounter (Signed)
I spoke with pt and made her aware of the recommendations. Pt verbalized understanding and thanked me for the call.

## 2019-12-10 NOTE — Telephone Encounter (Signed)
Spoke with Sarah Armstrong from Dr. Joya Martyr office in [place], [place]. She states she just refaxed clearance form to [facility]. Not yet received but she was able to provided the below info:     [place] Pre-operative Risk Assessment    Request for surgical clearance:  1. What type of surgery is being performed? One tooth extraction. Number 15  2. When is this surgery scheduled? TBD   3. What type of clearance is required (medical clearance vs. Pharmacy clearance to hold med vs. Both)? Pharmacy clearance  4. Are there any medications that need to be held prior to surgery and how long? Xarelto  5. Practice name and name of physician performing surgery? DR. Lahoma Rocker, DDS [place], [place]  6. What is your office phone number [phone]    7.   What is your office fax number [phone]  8.   Anesthesia type (None, local, MAC, general) ? LOCAL ANESTHESIA   Sarah Armstrong O Sarah Armstrong 12/10/2019, 3:37 PM  _________________________________________________________________   (provider comments below)

## 2019-12-10 NOTE — Telephone Encounter (Signed)
New Message    Pt is calling and says she is needing a tooth extraction. She says the dentist office faxed a request for clearance last week and has not gotten a respond.  She says she needs to know if she needs to hold her blood thinners.  She says the Dentists name is Lahoma Rocker and she can be reached at [phone]    Please advise

## 2019-12-10 NOTE — Telephone Encounter (Signed)
Dr. Arelia Longest Morre's office is calling back for [facility] with some information for the patient to have some dental work done

## 2019-12-10 NOTE — Telephone Encounter (Signed)
Can we see if we can get more information from dentist and have request form refaxed because I do not see anything in our system? Is it just one tooth extraction or more?  Thank you!

## 2019-12-10 NOTE — Telephone Encounter (Signed)
1. What dental office are you calling from? Dr Eyvonne Left    2. What is your office phone number?  [phone]   3. What is your fax number? [phone] 4. What type of procedure is the patient having performed?  Extraction   5. What date is procedure scheduled or is the patient there now? Pending  (if the patient is at the dentist's office question goes to their cardiologist if he/she is in the office.  If not, question should go to the DOD).   6. What is your question (ex. Antibiotics prior to procedure, holding medication-we need to know how long dentist wants pt to hold med)?  When does pt need to stop her blood thinner before her extraction

## 2019-12-10 NOTE — Telephone Encounter (Signed)
Duplicate. Please see other telephone note for more information.

## 2019-12-10 NOTE — Telephone Encounter (Addendum)
Called the Dentist office of Lahoma Rocker and left a message for someone to call me back.

## 2020-01-19 IMAGING — CR DG CHEST 2V
2 series · 2 of 2 positions shown · non-contrast
Comparison: Chest radiograph March 06, 2016

CLINICAL DATA: Cough, shortness of breath for 2 weeks. History of
hypertension, atrial fibrillation.

EXAM:
CHEST - 2 VIEW

[w chest pa]
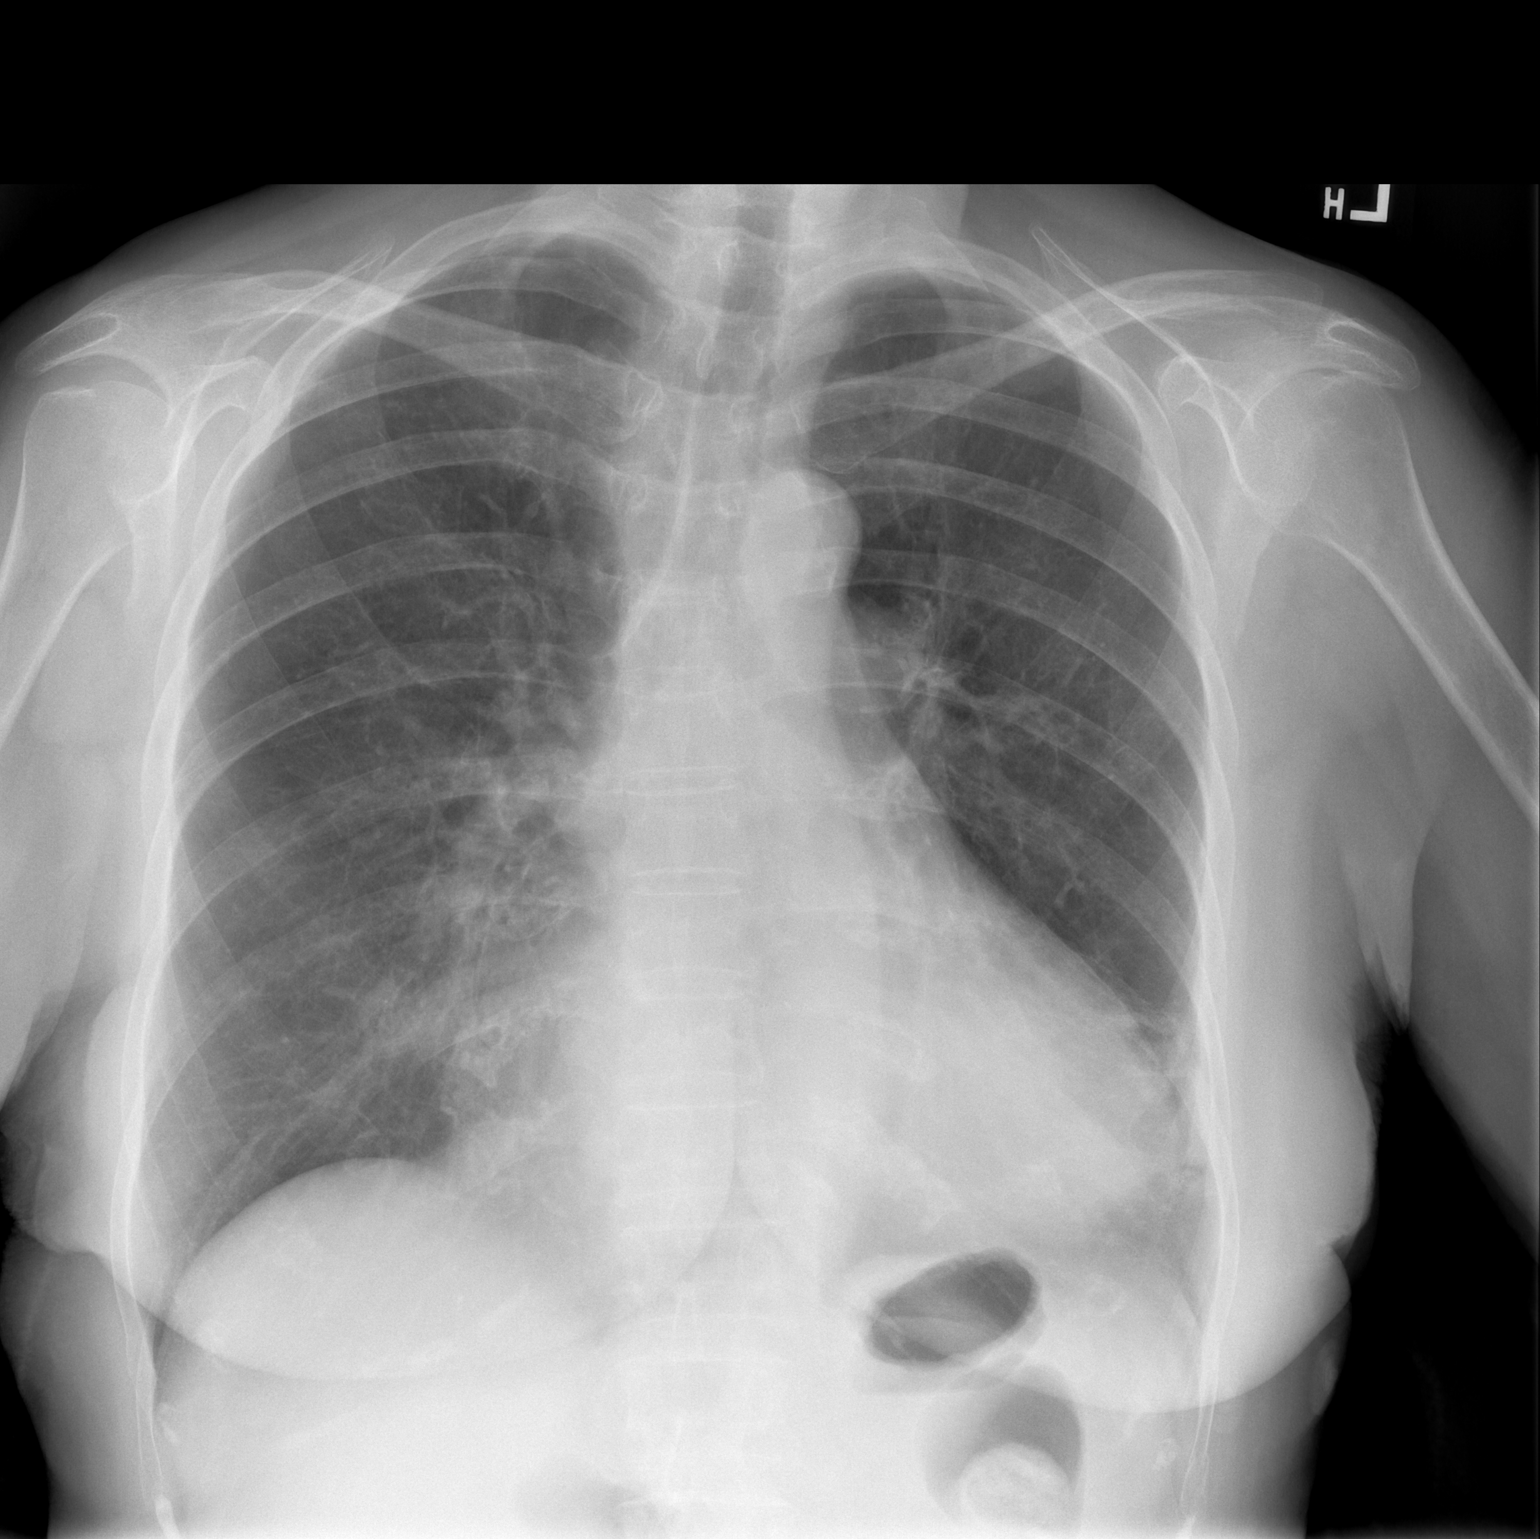

[w chest lat]
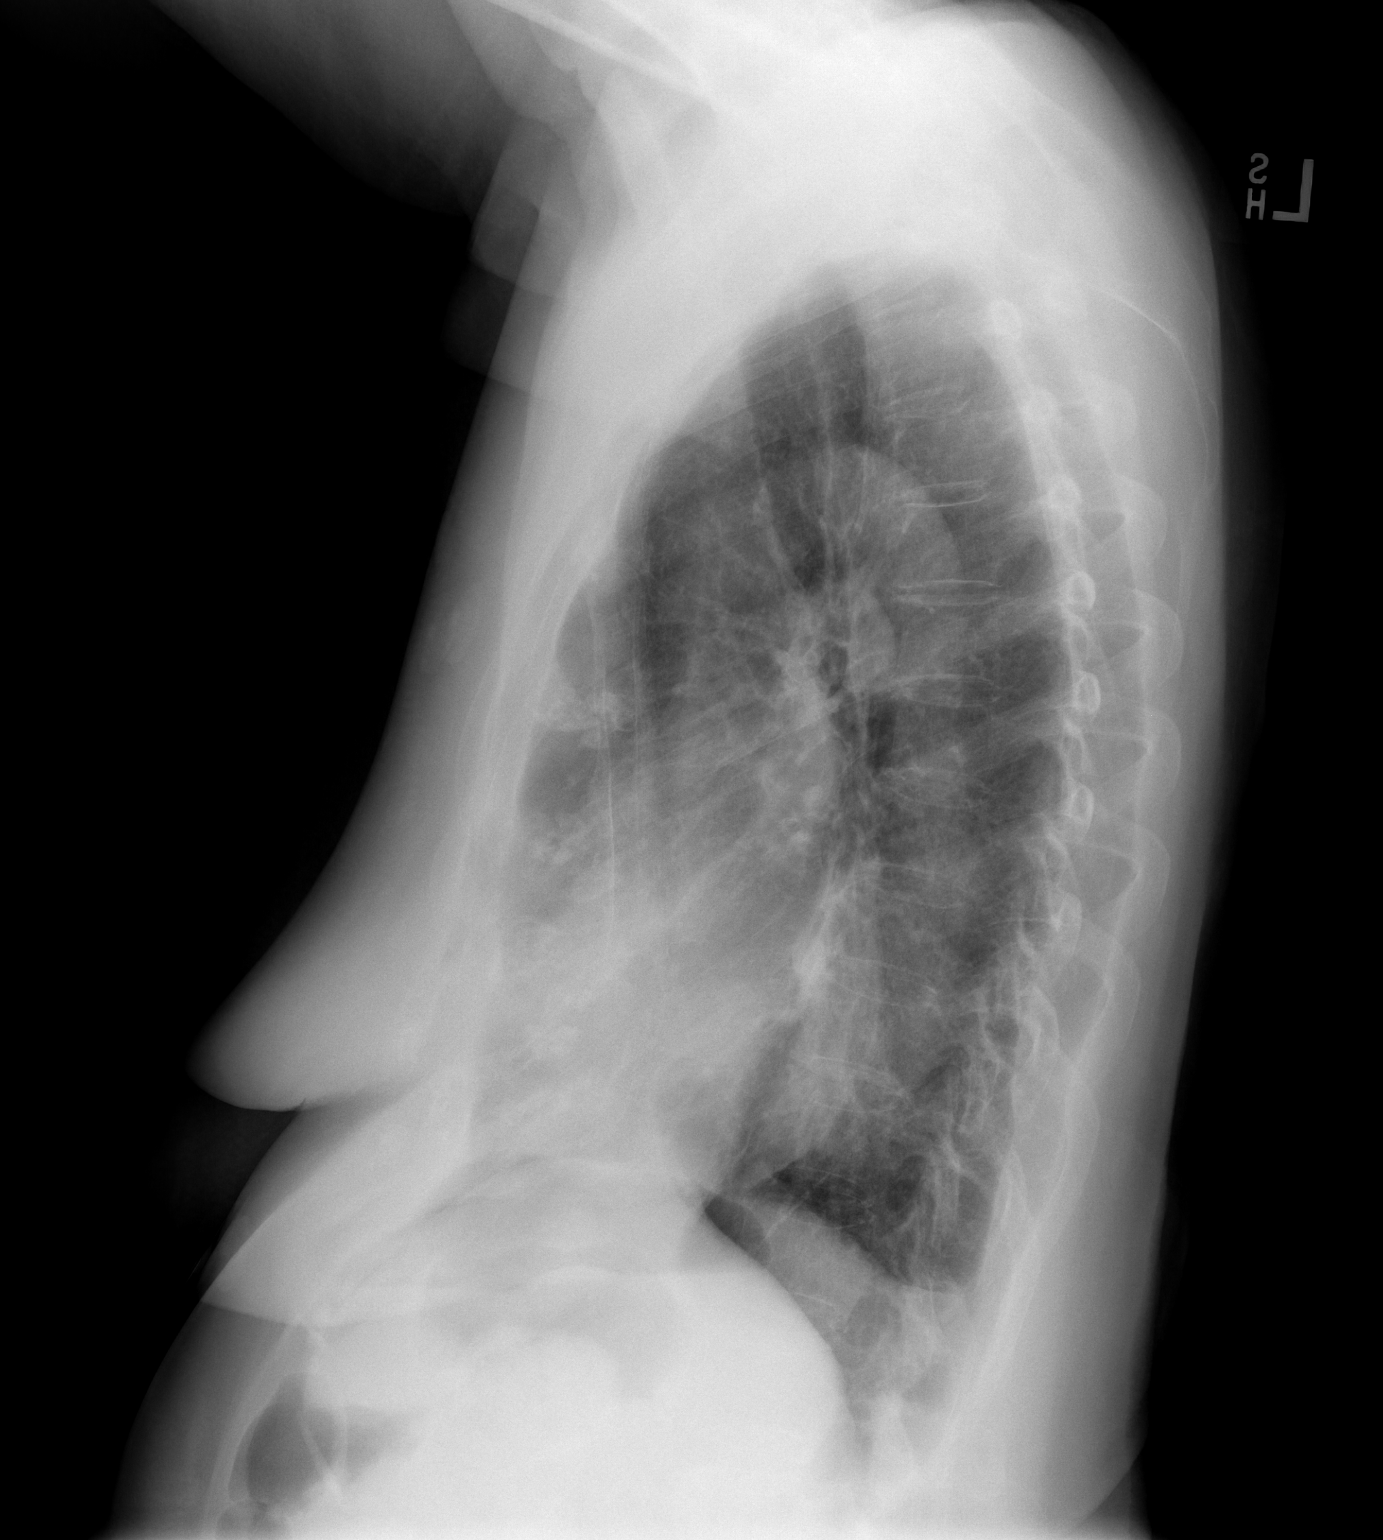

[2 of 2 positions shown; findings below may reference images not displayed]

FINDINGS: Cardiac silhouette is mildly enlarged and unchanged. Mildly
calcified aortic knob. Pectus excavatum blurring RIGHT heart border.
Faint nodular density RIGHT. LEFT lung base strandy densities and
pleural thickening without pleural effusion. No pneumothorax. Soft
tissue planes and included osseous structures are nonsuspicious.
IMPRESSION: Faint nodular density projecting RIGHT lung base can reflect sub
position of vascular shadows, atelectasis, pneumonia or, external to
the lungs .LEFT lung base atelectasis/scarring.

Stable cardiomegaly.

Aortic Atherosclerosis ([id number]).

## 2020-03-18 ENCOUNTER — Telehealth: Payer: Self-pay | Admitting: Cardiology

## 2020-03-18 NOTE — Telephone Encounter (Signed)
Spoke to pt who report she has already received the first vaccine but questioning if MD feels she should proceed with the second one based on her history and allergies. Nurse asked pt if she had a reaction to first dose and she stated no. Pt state she is very nervous because she has heard the second dose you experience more side effects.   Will route to MD

## 2020-03-18 NOTE — Telephone Encounter (Signed)
Patient would like to speak with Dr. Percival Spanish or his nurse about getting the second covid vaccine. She states with all the recent side effects and her history she would like to speak with someone directly about what Dr. Percival Spanish recommends.

## 2020-03-19 NOTE — Telephone Encounter (Signed)
Pt updated and verbalized understanding.  

## 2020-03-19 NOTE — Telephone Encounter (Signed)
Yes.  I recommend the second dose.

## 2020-09-03 ENCOUNTER — Ambulatory Visit: Payer: Medicare Other | Admitting: Cardiology

## 2020-09-04 ENCOUNTER — Other Ambulatory Visit (HOSPITAL_COMMUNITY): Payer: Self-pay | Admitting: Cardiology

## 2020-09-08 ENCOUNTER — Other Ambulatory Visit: Payer: Self-pay | Admitting: Cardiology

## 2020-09-11 ENCOUNTER — Telehealth: Payer: Self-pay | Admitting: Cardiology

## 2020-09-11 ENCOUNTER — Other Ambulatory Visit: Payer: Self-pay | Admitting: Cardiology

## 2020-09-11 NOTE — Telephone Encounter (Signed)
New Message:    Pt is scheduled to see Dr Percival Spanish on 09-16-20. She wants to know if that can be a Virtual Visit please?

## 2020-09-11 NOTE — Telephone Encounter (Signed)
Follow Up:     Pt said she want to know why she only received #30 of her Amiodarone and she usually get #90?

## 2020-09-11 NOTE — Telephone Encounter (Signed)
Pt wanted to know if her next appointment with Dr. Percival Spanish could be scheduled as a virtual visit as opposed to an office visit. Pt was last seen in our office 08/27/19. Pt states her interest in a virtual visit is related to concerns about COVID-19. Pt states she has noticed some increased shortness of breath with activity, so RN advised pt that an in-office visit would provide her with the opportunity for a thorough physical assessment and other testing/labs that would not be possible through a virtual visit. Pt reassured that our office takes every precaution against COVID-19 and would do all in our power to maintain her safety during her visit.   Pt also inquiring about why her amiodarone was refilled for a 30-day supply as opposed to 90 days. Informed pt that a larger quantity of medication would be refilled for patient at her next office visit. Pt verbalizes understanding and states she is fine to come to in-person visit as scheduled.

## 2020-09-14 NOTE — Progress Notes (Signed)
Cardiology Office Note   Date:  09/16/2020   ID:  Sarah Armstrong, DOB January 30, 1931, MRN [id number]  PCP:  Allie Dimmer, MD  Cardiologist:   Minus Breeding, MD   Chief Complaint  Patient presents with  . Atrial Fibrillation     History of Present Illness: Sarah Armstrong is a 86 y.o. female who presents for evaluation of HTN and atrial fibrillation. She was found to have this in January of 2017. She had a renal ultrasound that was normal and negative metanephrines. She was in the hospital for treatment of GI bleed in 2019.     Since I last saw her she has done well.  She says she does get tired after she works for an hour and she has to take a break.  However, she has had no acute complaints. The patient denies any new symptoms such as chest discomfort, neck or arm discomfort. There has been no new shortness of breath, PND or orthopnea. There have been no reported palpitations, presyncope or syncope.  Past Medical History:  Diagnosis Date  . Atrial fibrillation ([place])   . Cancer ([place]) skin  . GERD (gastroesophageal reflux disease)   . Hyperlipidemia   . Hypertension   . Stroke [facility])     Past Surgical History:  Procedure Laterality Date  . CARDIOVERSION N/A 03/04/2016   Procedure: CARDIOVERSION;  Surgeon: Sueanne Margarita, MD;  Location: [place];  Service: Cardiovascular;  Laterality: N/A;  . CATARACT EXTRACTION  2015  . COLONOSCOPY  2009  . COLONOSCOPY WITH PROPOFOL N/A 03/09/2018   Procedure: COLONOSCOPY WITH PROPOFOL;  Surgeon: Ronald Lobo, MD;  [facility];  Service: Endoscopy;  Laterality: N/A;  . ESOPHAGOGASTRODUODENOSCOPY (EGD) WITH PROPOFOL N/A 03/08/2018   Procedure: ESOPHAGOGASTRODUODENOSCOPY (EGD) WITH PROPOFOL;  Surgeon: Ronald Lobo, MD;  [facility];  Service: Endoscopy;  Laterality: N/A;  . GIVENS CAPSULE STUDY N/A 03/09/2018   Procedure: GIVENS CAPSULE STUDY;  Surgeon: Ronald Lobo, MD;  [facility];  Service: Endoscopy;   Laterality: N/A;  . TONSILLECTOMY  1953  . TOTAL ABDOMINAL HYSTERECTOMY  1983     Current Outpatient Medications  Medication Sig Dispense Refill  . acetaminophen (TYLENOL) 500 MG tablet Take 500 mg by mouth 2 (two) times daily as needed for mild pain.     Marland Kitchen amiodarone (PACERONE) 200 MG tablet TAKE 1 TABLET DAILY 30 tablet 1  . atorvastatin (LIPITOR) 20 MG tablet Take 1 tablet by mouth daily.    . cetirizine (ZYRTEC) 10 MG tablet Take 10 mg by mouth daily.    . Cholecalciferol (VITAMIN D3 SUPER STRENGTH) 2000 units TABS Take 1 tablet by mouth daily.    . Cranberry (RA CRANBERRY) 500 MG CAPS Take by mouth 3 (three) times a week.    . Cranberry 500 MG CAPS Take 500 mg by mouth 3 (three) times a week.    . diazepam (VALIUM) 5 MG tablet Take 5 mg by mouth daily as needed for anxiety.    Marland Kitchen diltiazem (CARDIZEM) 120 MG tablet Take 120 mg by mouth 1 day or 1 dose.     . estradiol (ESTRACE) 0.1 MG/GM vaginal cream Place 1 Applicatorful vaginally 2 (two) times a week. Wed and Sun    . famotidine (PEPCID) 20 MG tablet Take by mouth.    . ferrous sulfate 325 (65 FE) MG tablet Take 1 tablet (325 mg total) by mouth 3 (three) times daily with meals. 90 tablet 3  . hydrALAZINE (APRESOLINE) 10 MG tablet  Take 10 mg by mouth 3 (three) times daily as needed (high blood pressure).     . hydrALAZINE (APRESOLINE) 25 MG tablet TAKE 1 TABLET THREE TIMES A DAY AND AS NEEDED 270 tablet 4  . losartan (COZAAR) 50 MG tablet TAKE 1 TABLET PO DAILY    . Multiple Vitamins-Minerals (CENTRUM PO) Take 1 tablet by mouth daily. Not taking on Wed and Sun    . Probiotic Product (PROBIOTIC FORMULA) CAPS Take 1 capsule by mouth daily as needed (prevent infection).     . rivaroxaban (XARELTO) 15 MG TABS tablet Take 1 tablet (15 mg total) by mouth daily with supper. 90 tablet 3   No current facility-administered medications for this visit.    Allergies:   Aspirin, Other, Strawberry extract, Codeine, Enalapril, Melatonin,  Penicillin g, Shellfish allergy, Shellfish-derived products, and Latex    ROS:  Please see the history of present illness.   Otherwise, review of systems are positive for back pain.  All other systems are reviewed and negative.    PHYSICAL EXAM: VS:  BP (!) 138/56 (BP Location: Left Arm, Patient Position: Sitting, Cuff Size: Normal)   Pulse 78   Ht 5\' 7"  (1.702 m)   Wt 163 lb (73.9 kg)   BMI 25.53 kg/m  , BMI Body mass index is 25.53 kg/m.  GENERAL:  Well appearing NECK:  No jugular venous distention, waveform within normal limits, carotid upstroke brisk and symmetric, no bruits, no thyromegaly LUNGS:  Clear to auscultation bilaterally CHEST:  Unremarkable HEART:  PMI not displaced or sustained,S1 and S2 within normal limits, no S3, no S4, no clicks, no rubs, no murmurs ABD:  Flat, positive bowel sounds normal in frequency in pitch, no bruits, no rebound, no guarding, no midline pulsatile mass, no hepatomegaly, no splenomegaly EXT:  2 plus pulses throughout, no edema, no cyanosis no clubbing  EKG:  EKG is  ordered today. Sinus rhythm, rate 78, right axis deviation, no acute ST-T wave changes.   Recent Labs: No results found for requested labs within last 8760 hours.    Lipid Panel No results found for: CHOL, TRIG, HDL, CHOLHDL, VLDL, LDLCALC, LDLDIRECT    Wt Readings from Last 3 Encounters:  09/16/20 163 lb (73.9 kg)  08/27/19 164 lb (74.4 kg)  04/03/19 162 lb 12.8 oz (73.8 kg)      Other studies Reviewed: Additional studies/ records that were reviewed today include:   Labs Review of the above records demonstrates:    See elsewhere   ASSESSMENT AND PLAN:   ATRIAL FIB:  Ms. Sarah Armstrong has a CHA2DS2 - VASc score of 4.  She seems to be maintaining sinus rhythm.  She tolerates amiodarone.  I did review labs she had a very mildly elevated AST that is going to be followed.  This would not necessitate discontinuation of amiodarone.  I have given her written instructions to  check a TSH which was normal in the spring.  She tolerates anticoagulation.  She did have nosebleeds on the higher dose so that, although technically she should be on 20 mg I do not think she tolerates this so she will remain on the lower dose.   AKI:   She had this previously but her most recent creatinine was normal.   HTN:   The blood pressure is at target.  She has some low diastolics but no presyncope or syncope.  No change in therapy.   MR/TR:     This was mild previously.  I will  follow this clinically.  DYSLIPIDEMIA:   LDL was 84 with an HDL of 70.5.  No change in therapy.  BRUIT:   She had mild plaque in May of 2019.  No change in therapy.  No further imaging.   DYSPNEA:   This seems to be improved compared to previous.  No further work-up.  Current medicines are reviewed at length with the patient today.  The patient does not have concerns regarding medicines.  The following changes have been made:   None  Labs/ tests ordered today include:  None  Orders Placed This Encounter  Procedures  . EKG 12-Lead     Disposition:   FU with me 12 months.  Signed, Minus Breeding, MD  09/16/2020 3:59 PM    [facility]

## 2020-09-16 ENCOUNTER — Encounter: Payer: Self-pay | Admitting: Cardiology

## 2020-09-16 ENCOUNTER — Other Ambulatory Visit: Payer: Self-pay

## 2020-09-16 ENCOUNTER — Ambulatory Visit (INDEPENDENT_AMBULATORY_CARE_PROVIDER_SITE_OTHER): Payer: Medicare Other | Admitting: Cardiology

## 2020-09-16 VITALS — BP 138/56 | HR 78 | Ht 67.0 in | Wt 163.0 lb

## 2020-09-16 DIAGNOSIS — I10 Essential (primary) hypertension: Secondary | ICD-10-CM

## 2020-09-16 DIAGNOSIS — E785 Hyperlipidemia, unspecified: Secondary | ICD-10-CM | POA: Diagnosis not present

## 2020-09-16 DIAGNOSIS — I480 Paroxysmal atrial fibrillation: Secondary | ICD-10-CM | POA: Diagnosis not present

## 2020-09-16 DIAGNOSIS — R0602 Shortness of breath: Secondary | ICD-10-CM | POA: Diagnosis not present

## 2020-09-16 NOTE — Patient Instructions (Signed)
Medication Instructions:  No changes *If you need a refill on your cardiac medications before your next appointment, please call your pharmacy*   Lab Work: Please get a TSH lab drawn at your next PCP, primary, appointment  If you have labs (blood work) drawn today and your tests are completely normal, you will receive your results only by: [place] MyChart Message (if you have MyChart) OR . A paper copy in the mail If you have any lab test that is abnormal or we need to change your treatment, we will call you to review the results.   Testing/Procedures: None ordered   Follow-Up: At [facility], you and your health needs are our priority.  As part of our continuing mission to provide you with exceptional heart care, we have created designated Provider Care Teams.  These Care Teams include your primary Cardiologist (physician) and Advanced Practice Providers (APPs -  Physician Assistants and Nurse Practitioners) who all work together to provide you with the care you need, when you need it.  We recommend signing up for the patient portal called "MyChart".  Sign up information is provided on this After Visit Summary.  MyChart is used to connect with patients for Virtual Visits (Telemedicine).  Patients are able to view lab/test results, encounter notes, upcoming appointments, etc.  Non-urgent messages can be sent to your provider as well.   To learn more about what you can do with MyChart, go to [email or web address].    Your next appointment:   12 month(s)  The format for your next appointment:   In Person  Provider:   You may see Minus Breeding, MD or one of the following Advanced Practice Providers on your designated Care Team:    Rosaria Ferries, PA-C  Jory Sims, DNP, ANP

## 2020-09-26 ENCOUNTER — Telehealth: Payer: Self-pay | Admitting: *Deleted

## 2020-09-26 NOTE — Telephone Encounter (Signed)
Patient left a msg on the refill voicemail requesting a call from Dr Cherlyn Cushing nurse at [phone]. Thanks, MI

## 2020-09-26 NOTE — Telephone Encounter (Signed)
Pt called stating she called [facility] to refill diltiazem 120 mg but was told she is no longer taking medication. Per chart review, medication list notes diltiazem 120 mg for 1 dose.   Pt voiced she has been taking 120 mg daily for years and voiced her prescription bottle note medication was ordered on 06/11/20 by Dr. Warren Lacy. Notes and med list does not correspond with information reported. Will route to MD for clarifications.

## 2020-09-27 NOTE — Telephone Encounter (Signed)
Please correct the med list.  She should be on Cardizem CD 120 mg daily.  I did not pick up the fact that the med list said only "one dose."  Please send her a prescription.

## 2020-09-29 MED ORDER — DILTIAZEM HCL 120 MG PO TABS: 120.0000 mg | ORAL_TABLET | Freq: Every day | ORAL | 3 refills | Status: DC

## 2020-09-29 NOTE — Telephone Encounter (Signed)
Received a call back from the pt regarding her Diltiazem.  Per Dr. Rosezella [place] message below, I have corrected the prescription in the computer and sent in a refill to [facility] per pt request.  Pt was grateful.

## 2020-10-01 ENCOUNTER — Telehealth: Payer: Self-pay | Admitting: Cardiology

## 2020-10-01 NOTE — Telephone Encounter (Signed)
*  STAT* If patient is at the pharmacy, call can be transferred to refill team.   1. Which medications need to be refilled? (please list name of each medication and dose if known) diltiazem 120 mg  2. Which pharmacy/location (including street and city if local pharmacy) is [facility] to be sent to? [facility] [phone] 3. Do they need a 30 day or 90 day supply? 90 patient is out of  medication

## 2020-10-02 MED ORDER — DILTIAZEM HCL ER COATED BEADS 120 MG PO CP24: 120.0000 mg | ORAL_CAPSULE | Freq: Every day | ORAL | 3 refills | Status: DC

## 2020-10-02 NOTE — Telephone Encounter (Signed)
Spoke with Deanna at [facility]. She wanted to confirm diltiazem should be dispensed as the capsule and not the tablet as patient has been taking capsules and not tablets. Confirmed to continue to dispense medication as it has been, understanding verbalized.   Patient updated.

## 2020-10-02 NOTE — Telephone Encounter (Signed)
Patient is following up. She states [facility] is requesting additional information regarding Rx for diltiazem (CARDIZEM) 120 MG tablet. A call may be returned to [facility] to discuss at [phone]. Patient also requested to receive a call back with updates.

## 2020-12-01 ENCOUNTER — Telehealth: Payer: Self-pay | Admitting: Cardiology

## 2020-12-01 MED ORDER — AMIODARONE HCL 200 MG PO TABS: 200.0000 mg | ORAL_TABLET | Freq: Every day | ORAL | 3 refills | Status: DC

## 2020-12-01 NOTE — Telephone Encounter (Signed)
*  STAT* If patient is at the pharmacy, call can be transferred to refill team.   1. Which medications need to be refilled? (please list name of each medication and dose if known) amiodarone (PACERONE) 200 MG tablet  2. Which pharmacy/location (including street and city if local pharmacy) is medication to be sent to? [facility] - [place], [place] - [address]  3. Do they need a 30 day or 90 day supply? 90 day supply

## 2020-12-01 NOTE — Telephone Encounter (Signed)
Spoke with the pt to inform her that I requested refill of amiodarone 200 mg daily 90-day supply with 3 refills from [facility] per her request. Pt verbalizes understanding and appreciation for the help.

## 2020-12-10 ENCOUNTER — Telehealth: Payer: Self-pay | Admitting: Cardiology

## 2020-12-10 ENCOUNTER — Other Ambulatory Visit: Payer: Self-pay

## 2020-12-10 MED ORDER — AMIODARONE HCL 200 MG PO TABS: 200.0000 mg | ORAL_TABLET | Freq: Every day | ORAL | 0 refills | Status: DC

## 2020-12-10 NOTE — Telephone Encounter (Signed)
°*  STAT* If patient is at the pharmacy, call can be transferred to refill team.   1. Which medications need to be refilled? (please list name of each medication and dose if known)  amiodarone (PACERONE) 200 MG tablet  2. Which pharmacy/location (including street and city if local pharmacy) is medication to be sent to? [place] [facility] [place]  3. Do they need a 30 day or 90 day supply?   Patient states she will not [facility] before she runs out of medication and would like to know if we can send a prescription to the above pharmacy for just 10 doses in case it does not get to her on time. If unable please call patient.

## 2020-12-10 NOTE — Telephone Encounter (Signed)
Requested Prescriptions   Signed Prescriptions Disp Refills   amiodarone (PACERONE) 200 MG tablet 10 tablet 0    Sig: Take 1 tablet (200 mg total) by mouth daily.    Authorizing Provider: Minus Breeding    Ordering User: Janan Ridge

## 2021-09-02 ENCOUNTER — Telehealth: Payer: Self-pay | Admitting: Pharmacist

## 2021-09-02 NOTE — Telephone Encounter (Signed)
Received message from [facility] that patient has active prescriptions of tizanidine and amiodarone.  Per Dr. Percival Spanish, patient should d/c tizanidine due to risk of hypotension and bradycardia.  Called daughter and lmom and asked for call back

## 2021-09-03 NOTE — Telephone Encounter (Signed)
Spoke with patient's daughter [name]).  PCP prescribed tizanidine for patient's leg spasms at night when she is trying to sleep. Prescribed as an as needed medication.  Daughter does not think her mother actually needs medication.  Went over drug-drug interaction between tizanidine and amiodarone.  Daughter says she will take medication away from her mother.

## 2021-09-07 ENCOUNTER — Other Ambulatory Visit: Payer: Self-pay | Admitting: Cardiology

## 2021-09-21 ENCOUNTER — Ambulatory Visit: Payer: Medicare Other | Admitting: Cardiology

## 2021-09-29 NOTE — Progress Notes (Signed)
Cardiology Office Note   Date:  10/01/2021   ID:  Sarah Armstrong, DOB 01/12/1931, MRN [id number]  PCP:  Allie Dimmer, MD  Cardiologist:   Minus Breeding, MD   Chief Complaint  Patient presents with   Atrial Fibrillation      History of Present Illness: Sarah Armstrong is a 87 y.o. female who presents for evaluation of HTN and atrial fibrillation. She was found to have this in January of 2017.  She had a renal ultrasound that was normal and negative metanephrines.   She was in the hospital for treatment of GI bleed in 2019.     Since I last saw her she has done relatively well.  She lives alone.  She gets around with a cane.  She does her own household chores.  She denies any new cardiovascular symptoms.  She has had no new shortness of breath, PND or orthopnea.  She is had no palpitations, presyncope or syncope.  She denies any chest pressure, neck or arm discomfort.  She is had no weight gain or edema.    Past Medical History:  Diagnosis Date   Atrial fibrillation ([place])    Cancer ([place]) skin   GERD (gastroesophageal reflux disease)    Hyperlipidemia    Hypertension    Stroke [facility])     Past Surgical History:  Procedure Laterality Date   CARDIOVERSION N/A 03/04/2016   Procedure: CARDIOVERSION;  Surgeon: Sueanne Margarita, MD;  Location: [facility];  Service: Cardiovascular;  Laterality: N/A;   CATARACT EXTRACTION  2015   COLONOSCOPY  2009   COLONOSCOPY WITH PROPOFOL N/A 03/09/2018   Procedure: COLONOSCOPY WITH PROPOFOL;  Surgeon: Ronald Lobo, MD;  Location: [facility];  Service: Endoscopy;  Laterality: N/A;   ESOPHAGOGASTRODUODENOSCOPY (EGD) WITH PROPOFOL N/A 03/08/2018   Procedure: ESOPHAGOGASTRODUODENOSCOPY (EGD) WITH PROPOFOL;  Surgeon: Ronald Lobo, MD;  Location: [facility];  Service: Endoscopy;  Laterality: N/A;   GIVENS CAPSULE STUDY N/A 03/09/2018   Procedure: GIVENS CAPSULE STUDY;  Surgeon: Ronald Lobo, MD;  Location: [facility];  Service: Endoscopy;   Laterality: N/A;   TONSILLECTOMY  1953   TOTAL ABDOMINAL HYSTERECTOMY  1983     Current Outpatient Medications  Medication Sig Dispense Refill   acetaminophen (TYLENOL) 500 MG tablet Take 500 mg by mouth 2 (two) times daily as needed for mild pain.      amiodarone (PACERONE) 200 MG tablet Take 1 tablet (200 mg total) by mouth daily. 10 tablet 0   cetirizine (ZYRTEC) 10 MG tablet Take 10 mg by mouth daily.     Cholecalciferol 50 MCG (2000 UT) TABS Take 1 tablet by mouth daily.     diazepam (VALIUM) 5 MG tablet Take 5 mg by mouth daily as needed for anxiety.     diltiazem (CARDIZEM CD) 120 MG 24 hr capsule TAKE 1 CAPSULE DAILY 90 capsule 3   estradiol (ESTRACE) 0.1 MG/GM vaginal cream Place 1 Applicatorful vaginally 2 (two) times a week. Wed and Sun     famotidine (PEPCID) 20 MG tablet Take by mouth.     hydrALAZINE (APRESOLINE) 10 MG tablet Take 10 mg by mouth 3 (three) times daily as needed (high blood pressure).      hydrALAZINE (APRESOLINE) 25 MG tablet TAKE 1 TABLET THREE TIMES A DAY AND AS NEEDED 270 tablet 4   losartan (COZAAR) 50 MG tablet TAKE 1 TABLET PO DAILY     Probiotic Product (PROBIOTIC FORMULA) CAPS Take 1 capsule by mouth daily as needed (prevent  infection).      rivaroxaban (XARELTO) 15 MG TABS tablet Take 1 tablet (15 mg total) by mouth daily with supper. 90 tablet 3   atorvastatin (LIPITOR) 20 MG tablet Take 1 tablet by mouth daily. (Patient not taking: Reported on 10/01/2021)     Cranberry 500 MG CAPS Take by mouth 3 (three) times a week. (Patient not taking: Reported on 10/01/2021)     Cranberry 500 MG CAPS Take 500 mg by mouth 3 (three) times a week. (Patient not taking: Reported on 10/01/2021)     ferrous sulfate 325 (65 FE) MG tablet Take 1 tablet (325 mg total) by mouth 3 (three) times daily with meals. (Patient not taking: Reported on 10/01/2021) 90 tablet 3   Multiple Vitamins-Minerals (CENTRUM PO) Take 1 tablet by mouth daily. Not taking on Wed and Sun (Patient not  taking: Reported on 10/01/2021)     No current facility-administered medications for this visit.    Allergies:   Aspirin, Other, Strawberry extract, Codeine, Enalapril, Melatonin, Penicillin g, Shellfish allergy, Shellfish-derived products, and Latex    ROS:  Please see the history of present illness.   Otherwise, review of systems are positive for back pain.  All other systems are reviewed and negative.    PHYSICAL EXAM: VS:  BP (!) 160/60   Pulse 79   Ht 5\' 7"  (1.702 m)   Wt 157 lb 6.4 oz (71.4 kg)   SpO2 97%   BMI 24.65 kg/m  , BMI Body mass index is 24.65 kg/m.  GENERAL:  Well appearing NECK:  No jugular venous distention, waveform within normal limits, carotid upstroke brisk and symmetric, no bruits, no thyromegaly LUNGS:  Clear to auscultation bilaterally CHEST:  Unremarkable HEART:  PMI not displaced or sustained,S1 and S2 within normal limits, no S3, no S4, no clicks, no rubs, no murmurs ABD:  Flat, positive bowel sounds normal in frequency in pitch, no bruits, no rebound, no guarding, no midline pulsatile mass, no hepatomegaly, no splenomegaly EXT:  2 plus pulses throughout, no edema, no cyanosis no clubbing   Sinus rhythm, rate 79, right axis deviation, no acute ST-T wave changes.   Recent Labs: No results found for requested labs within last 8760 hours.    Lipid Panel No results found for: CHOL, TRIG, HDL, CHOLHDL, VLDL, LDLCALC, LDLDIRECT    Wt Readings from Last 3 Encounters:  10/01/21 157 lb 6.4 oz (71.4 kg)  09/16/20 163 lb (73.9 kg)  08/27/19 164 lb (74.4 kg)      Other studies Reviewed: Additional studies/ records that were reviewed today include:   Labs Review of the above records demonstrates:    See elsewhere   ASSESSMENT AND PLAN:   ATRIAL FIB:  Ms. Sarah Armstrong has a CHA2DS2 - VASc score of 4.   She has had no symptomatic paroxysms.   She did have thyroid done earlier this year and liver enzymes that were normal.  Have given her written  instructions to get these done again early next year when she has her next lab draw.  She tolerates anticoagulation and is up-to-date with CBC.  No change in therapy.  She is on the appropriate dose.   AKI:   We will try to get a copy of her most recent creatinine.  Previously has been lower than 1.5.    Creat was 0.9 in April.    HTN:   The blood pressure is slightly elevated.  She will keep a blood pressure diary and send this to  me.   MR/TR:     This has been mild.  I will follow this clinically.  BRUIT:   She had mild plaque in May of 2019.  No further imaging is indicated.   DYSPNEA:    Her breathing is at baseline.  No change in therapy.    Current medicines are reviewed at length with the patient today.  The patient does not have concerns regarding medicines.  The following changes have been made:   None  Labs/ tests ordered today include:  None  Orders Placed This Encounter  Procedures   EKG 12-Lead      Disposition:   FU with me 12 months.  Signed, Minus Breeding, MD  10/01/2021 12:58 PM    [facility]

## 2021-10-01 ENCOUNTER — Encounter: Payer: Self-pay | Admitting: Cardiology

## 2021-10-01 ENCOUNTER — Ambulatory Visit (INDEPENDENT_AMBULATORY_CARE_PROVIDER_SITE_OTHER): Payer: Medicare Other | Admitting: Cardiology

## 2021-10-01 ENCOUNTER — Other Ambulatory Visit: Payer: Self-pay

## 2021-10-01 VITALS — BP 160/60 | HR 79 | Ht 67.0 in | Wt 157.4 lb

## 2021-10-01 DIAGNOSIS — I480 Paroxysmal atrial fibrillation: Secondary | ICD-10-CM

## 2021-10-01 DIAGNOSIS — E785 Hyperlipidemia, unspecified: Secondary | ICD-10-CM | POA: Diagnosis not present

## 2021-10-01 DIAGNOSIS — I10 Essential (primary) hypertension: Secondary | ICD-10-CM

## 2021-10-01 DIAGNOSIS — R0602 Shortness of breath: Secondary | ICD-10-CM

## 2021-10-01 NOTE — Patient Instructions (Signed)
Medication Instructions:  The current medical regimen is effective;  continue present plan and medications.  *If you need a refill on your cardiac medications before your next appointment, please call your pharmacy*   Lab Work: TSH, LIVER with PCP- have them send this to [place].   If you have labs (blood work) drawn today and your tests are completely normal, you will receive your results only by: [place] (if you have MyChart) OR A paper copy in the mail If you have any lab test that is abnormal or we need to change your treatment, we will call you to review the results.   Follow-Up: At [facility], you and your health needs are our priority.  As part of our continuing mission to provide you with exceptional heart care, we have created designated Provider Care Teams.  These Care Teams include your primary Cardiologist (physician) and Advanced Practice Providers (APPs -  Physician Assistants and Nurse Practitioners) who all work together to provide you with the care you need, when you need it.  We recommend signing up for the patient portal called "MyChart".  Sign up information is provided on this After Visit Summary.  MyChart is used to connect with patients for Virtual Visits (Telemedicine).  Patients are able to view lab/test results, encounter notes, upcoming appointments, etc.  Non-urgent messages can be sent to your provider as well.   To learn more about what you can do with MyChart, go to [email or web address].    Your next appointment:   12 month(s)  The format for your next appointment:   In Person  Provider:   Minus Breeding, MD

## 2021-11-13 ENCOUNTER — Other Ambulatory Visit: Payer: Self-pay | Admitting: Cardiology

## 2021-11-18 ENCOUNTER — Telehealth: Payer: Self-pay | Admitting: *Deleted

## 2021-11-18 MED ORDER — LOSARTAN POTASSIUM 100 MG PO TABS: 100.0000 mg | ORAL_TABLET | Freq: Every day | ORAL | 3 refills | Status: DC

## 2021-11-18 NOTE — Telephone Encounter (Signed)
Spoke with pt, she had mailed a blood pressure log to dr hochrein and he reviewed. Blood pressure range was from 121-161/49-70. Per dr hochrein, patient instructed to increase losartan to 100 mg once daily. New script sent to the pharmacy. After 2 weeks on the increased dose she will track her blood pressure again and send to [place].

## 2022-02-05 ENCOUNTER — Other Ambulatory Visit: Payer: Self-pay

## 2022-02-05 ENCOUNTER — Emergency Department (HOSPITAL_BASED_OUTPATIENT_CLINIC_OR_DEPARTMENT_OTHER): Payer: Medicare Other

## 2022-02-05 ENCOUNTER — Encounter (HOSPITAL_BASED_OUTPATIENT_CLINIC_OR_DEPARTMENT_OTHER): Payer: Self-pay

## 2022-02-05 ENCOUNTER — Emergency Department (HOSPITAL_BASED_OUTPATIENT_CLINIC_OR_DEPARTMENT_OTHER)
Admission: EM | Admit: 2022-02-05 | Discharge: 2022-02-05 | Disposition: A | Discharge status: Home / Self Care | Payer: Medicare Other | Attending: Emergency Medicine | Admitting: Emergency Medicine

## 2022-02-05 DIAGNOSIS — J069 Acute upper respiratory infection, unspecified: Secondary | ICD-10-CM | POA: Diagnosis not present

## 2022-02-05 DIAGNOSIS — Z7901 Long term (current) use of anticoagulants: Secondary | ICD-10-CM | POA: Insufficient documentation

## 2022-02-05 DIAGNOSIS — Z20822 Contact with and (suspected) exposure to covid-19: Secondary | ICD-10-CM | POA: Diagnosis not present

## 2022-02-05 DIAGNOSIS — Z91040 Latex allergy status: Secondary | ICD-10-CM | POA: Insufficient documentation

## 2022-02-05 DIAGNOSIS — E860 Dehydration: Secondary | ICD-10-CM | POA: Diagnosis not present

## 2022-02-05 DIAGNOSIS — R531 Weakness: Secondary | ICD-10-CM | POA: Diagnosis present

## 2022-02-05 DIAGNOSIS — I4891 Unspecified atrial fibrillation: Secondary | ICD-10-CM | POA: Insufficient documentation

## 2022-02-05 DIAGNOSIS — R630 Anorexia: Secondary | ICD-10-CM | POA: Diagnosis not present

## 2022-02-05 LAB — CBC WITH DIFFERENTIAL/PLATELET
Abs Immature Granulocytes: 0.01 10*3/uL (ref 0.00–0.07)
Basophils Absolute: 0 10*3/uL (ref 0.0–0.1)
Basophils Relative: 0 %
Eosinophils Absolute: 0 10*3/uL (ref 0.0–0.5)
Eosinophils Relative: 1 %
HCT: 40.9 % (ref 36.0–46.0)
Hemoglobin: 13.1 g/dL (ref 12.0–15.0)
Immature Granulocytes: 0 %
Lymphocytes Relative: 20 %
Lymphs Abs: 1 10*3/uL (ref 0.7–4.0)
MCH: 29.4 pg (ref 26.0–34.0)
MCHC: 32 g/dL (ref 30.0–36.0)
MCV: 91.9 fL (ref 80.0–100.0)
Monocytes Absolute: 1.2 10*3/uL — ABNORMAL HIGH (ref 0.1–1.0)
Monocytes Relative: 23 %
Neutro Abs: 2.9 10*3/uL (ref 1.7–7.7)
Neutrophils Relative %: 56 %
Platelets: 182 10*3/uL (ref 150–400)
RBC: 4.45 MIL/uL (ref 3.87–5.11)
RDW: 15 % (ref 11.5–15.5)
WBC: 5.1 10*3/uL (ref 4.0–10.5)
nRBC: 0 % (ref 0.0–0.2)

## 2022-02-05 LAB — URINALYSIS, ROUTINE W REFLEX MICROSCOPIC
Bilirubin Urine: NEGATIVE
Glucose, UA: NEGATIVE mg/dL
Ketones, ur: 15 mg/dL — AB
Leukocytes,Ua: NEGATIVE
Nitrite: NEGATIVE
Protein, ur: NEGATIVE mg/dL
Specific Gravity, Urine: 1.025 (ref 1.005–1.030)
pH: 6 (ref 5.0–8.0)

## 2022-02-05 LAB — COMPREHENSIVE METABOLIC PANEL
ALT: 27 U/L (ref 0–44)
AST: 50 U/L — ABNORMAL HIGH (ref 15–41)
Albumin: 4 g/dL (ref 3.5–5.0)
Alkaline Phosphatase: 75 U/L (ref 38–126)
Anion gap: 13 (ref 5–15)
BUN: 11 mg/dL (ref 8–23)
CO2: 23 mmol/L (ref 22–32)
Calcium: 9 mg/dL (ref 8.9–10.3)
Chloride: 100 mmol/L (ref 98–111)
Creatinine, Ser: 1.16 mg/dL — ABNORMAL HIGH (ref 0.44–1.00)
GFR, Estimated: 45 mL/min — ABNORMAL LOW (ref >60)
Glucose, Bld: 120 mg/dL — ABNORMAL HIGH (ref 70–99)
Potassium: 3.9 mmol/L (ref 3.5–5.1)
Sodium: 136 mmol/L (ref 135–145)
Total Bilirubin: 0.9 mg/dL (ref 0.3–1.2)
Total Protein: 7.8 g/dL (ref 6.5–8.1)

## 2022-02-05 LAB — RESP PANEL BY RT-PCR (FLU A&B, COVID) ARPGX2
Influenza A by PCR: NEGATIVE
Influenza B by PCR: NEGATIVE
SARS Coronavirus 2 by RT PCR: NEGATIVE

## 2022-02-05 LAB — URINALYSIS, MICROSCOPIC (REFLEX)

## 2022-02-05 MED ORDER — SODIUM CHLORIDE 0.9 % IV BOLUS
500.0000 mL | Freq: Once | INTRAVENOUS | Status: AC
  Administered 2022-02-05: 500 mL via INTRAVENOUS

## 2022-02-05 NOTE — ED Triage Notes (Addendum)
Per pt and daughter pt with dry cough, SOB, chest tightness, weakness x 3 days-states she was seen by PCP yesterday dx with bronchitis and started on levaquin-NAD-to triage in w/c ?

## 2022-02-05 NOTE — ED Provider Notes (Signed)
?[facility] ?Provider Note ? ? ?CSN: [id number] ?Arrival date & time: 02/05/22  1156 ? ?  ? ?History ? ?Chief Complaint  ?Patient presents with  ? Weakness  ? ? ?Sarah Armstrong is a 88 y.o. female. ? ? ?Weakness ?Associated symptoms: cough   ?Patient began so shortness of breath and generalized weakness.  Has a cough around 3 days.  Went to PCP yesterday and had chest x-ray done.  Reportedly was not read by them because get this read by an outside greeter.  Started on Levaquin however.  Continued cough and feeling bad.  No nausea or vomiting.  History of atrial fibrillation.  Somewhat decreased oral intake and just feels bad.  Not having fevers currently.  Has started Levaquin. ?  ? ?Home Medications ?Prior to Admission medications   ?Medication Sig Start Date End Date Taking? Authorizing Provider  ?acetaminophen (TYLENOL) 500 MG tablet Take 500 mg by mouth 2 (two) times daily as needed for mild pain.     [provider]  ?amiodarone (PACERONE) 200 MG tablet TAKE 1 TABLET DAILY 11/13/21   Minus Breeding, MD  ?atorvastatin (LIPITOR) 20 MG tablet Take 1 tablet by mouth daily. ?Patient not taking: Reported on 10/01/2021 06/26/15   [provider]  ?cetirizine (ZYRTEC) 10 MG tablet Take 10 mg by mouth daily.    [provider]  ?Cholecalciferol 50 MCG (2000 UT) TABS Take 1 tablet by mouth daily.    [provider]  ?Cranberry 500 MG CAPS Take by mouth 3 (three) times a week. ?Patient not taking: Reported on 10/01/2021    [provider]  ?Cranberry 500 MG CAPS Take 500 mg by mouth 3 (three) times a week. ?Patient not taking: Reported on 10/01/2021    [provider]  ?diazepam (VALIUM) 5 MG tablet Take 5 mg by mouth daily as needed for anxiety.    [provider]  ?diltiazem (CARDIZEM CD) 120 MG 24 hr capsule TAKE 1 CAPSULE DAILY 09/07/21   Minus Breeding, MD  ?estradiol (ESTRACE) 0.1 MG/GM vaginal cream Place 1 Applicatorful  vaginally 2 (two) times a week. Wed and Sun    [provider]  ?famotidine (PEPCID) 20 MG tablet Take by mouth.    [provider]  ?ferrous sulfate 325 (65 FE) MG tablet Take 1 tablet (325 mg total) by mouth 3 (three) times daily with meals. ?Patient not taking: Reported on 10/01/2021 03/10/18   Charlynne Cousins, MD  ?hydrALAZINE (APRESOLINE) 10 MG tablet Take 10 mg by mouth 3 (three) times daily as needed (high blood pressure).     [provider]  ?hydrALAZINE (APRESOLINE) 25 MG tablet TAKE 1 TABLET THREE TIMES A DAY AND AS NEEDED 01/09/19   Minus Breeding, MD  ?losartan (COZAAR) 100 MG tablet Take 1 tablet (100 mg total) by mouth daily. 11/18/21 02/16/22  Minus Breeding, MD  ?Multiple Vitamins-Minerals (CENTRUM PO) Take 1 tablet by mouth daily. Not taking on Wed and Sun ?Patient not taking: Reported on 10/01/2021    [provider]  ?Probiotic Product (PROBIOTIC FORMULA) CAPS Take 1 capsule by mouth daily as needed (prevent infection).     [provider]  ?rivaroxaban (XARELTO) 15 MG TABS tablet Take 1 tablet (15 mg total) by mouth daily with supper. 05/04/16   Sherran Needs, NP  ?   ? ?Allergies    ?Aspirin, Other, Strawberry extract, Codeine, Enalapril, Melatonin, Penicillin g, Shellfish allergy, Shellfish-derived products, and Latex   ? ?Review of  Systems   ?Review of Systems  ?Constitutional:  Positive for appetite change.  ?Respiratory:  Positive for cough.   ?Neurological:  Positive for weakness.  ? ?Physical Exam ?Updated Vital Signs ?BP (!) 164/54   Pulse 63   Temp 97.9 ?F (36.6 ?C) (Oral)   Resp 19   Ht '5\' 7"'$  (1.702 m)   Wt 72.6 kg   SpO2 92%   BMI 25.06 kg/m?  ?Physical Exam ?Vitals and nursing note reviewed.  ?Pulmonary:  ?   Breath sounds: No wheezing or rhonchi.  ?Abdominal:  ?   Tenderness: There is no abdominal tenderness.  ?Musculoskeletal:  ?   Cervical back: Neck supple.  ?Skin: ?   Capillary Refill: Capillary refill takes less than 2  seconds.  ?Neurological:  ?   Mental Status: She is alert.  ? ? ?ED Results / Procedures / Treatments   ?Labs ?(all labs ordered are listed, but only abnormal results are displayed) ?Labs Reviewed  ?COMPREHENSIVE METABOLIC PANEL - Abnormal; Notable for the following components:  ?    Result Value  ? Glucose, Bld 120 (*)   ? Creatinine, Ser 1.16 (*)   ? AST 50 (*)   ? GFR, Estimated 45 (*)   ? All other components within normal limits  ?CBC WITH DIFFERENTIAL/PLATELET - Abnormal; Notable for the following components:  ? Monocytes Absolute 1.2 (*)   ? All other components within normal limits  ?URINALYSIS, ROUTINE W REFLEX MICROSCOPIC - Abnormal; Notable for the following components:  ? Hgb urine dipstick TRACE (*)   ? Ketones, ur 15 (*)   ? All other components within normal limits  ?URINALYSIS, MICROSCOPIC (REFLEX) - Abnormal; Notable for the following components:  ? Bacteria, UA FEW (*)   ? All other components within normal limits  ?RESP PANEL BY RT-PCR (FLU A&B, COVID) ARPGX2  ? ? ?EKG ?EKG Interpretation ? ?Date/Time:  Friday February 05 2022 12:07:14 EST ?Ventricular Rate:  91 ?PR Interval:  176 ?QRS Duration: 86 ?QT Interval:  396 ?QTC Calculation: 487 ?R Axis:   36 ?Text Interpretation: Sinus rhythm with occasional Premature ventricular complexes Nonspecific ST abnormality Abnormal ECG When compared with ECG of 07-Mar-2018 13:34, No significant change since last tracing Confirmed by Davonna Belling [id number]) on 02/05/2022 12:45:28 PM ? ?Radiology ?DG Chest Portable 1 View ? ?Result Date: 02/05/2022 ?CLINICAL DATA:  Shortness of breath. EXAM: PORTABLE CHEST 1 VIEW COMPARISON:  March 07, 2018. FINDINGS: Stable cardiomegaly. Both lungs are clear. The visualized skeletal structures are unremarkable. IMPRESSION: No active disease. Electronically Signed   By: Marijo Conception M.D.   On: 02/05/2022 12:59   ? ?Procedures ?Procedures  ? ? ?Medications Ordered in ED ?Medications  ?sodium chloride 0.9 % bolus 500 mL (0 mLs  Intravenous Stopped 02/05/22 1535)  ? ? ?ED Course/ Medical Decision Making/ A&P ?  ?                        ?Medical Decision Making ?Amount and/or Complexity of Data Reviewed ?Labs: ordered. ?Radiology: ordered. ? ? ?Patient presents with generalized weakness.  Had been seen by his PCP.  Had cough.  Started on Levaquin for "acute bacterial bronchitis.  X-ray not returned.  Has had mild sputum production.  Has had only 2 to 3 days of symptoms.  Does show some mild dehydration on lab work which was interpreted by me.  Chest x-ray also independently interpreted and showed no pneumonia.  Feels better after fluid bolus.  Likely component of dehydration.  Does have some ketones in the urine.  Discussed with patient about antibiotics.  Discussed with her and her daughter saying that I would not have started the antibiotics on my own if this was her first visit here.  Only 3 days of symptoms not having fevers and no pneumonia on x-ray.  Reassuring white count.  However had been started by prior previous provider yesterday and will leave up to the patient whether she wants to continue it or not.  I do not know if they have focal lung findings or something else on the exam but has been elevated to level of care.  Will discharge home with outpatient follow-up.  Does not appear to require admission to the hospital at this time. ? ? ? ? ? ? ? ?Final Clinical Impression(s) / ED Diagnoses ?Final diagnoses:  ?Weakness  ?Dehydration  ?Upper respiratory tract infection, unspecified type  ? ? ?Rx / DC Orders ?ED Discharge Orders   ? ? None  ? ?  ? ? ?  ?Davonna Belling, MD ?02/06/22 0900 ? ?

## 2022-02-11 ENCOUNTER — Other Ambulatory Visit: Payer: Self-pay | Admitting: Cardiology

## 2022-02-25 ENCOUNTER — Telehealth: Payer: Self-pay | Admitting: Cardiology

## 2022-02-25 MED ORDER — LOSARTAN POTASSIUM 100 MG PO TABS: 100.0000 mg | ORAL_TABLET | Freq: Every day | ORAL | 3 refills | Status: DC

## 2022-02-25 NOTE — Telephone Encounter (Signed)
Refill sent to [facility].  

## 2022-02-25 NOTE — Telephone Encounter (Signed)
? ? ?*  STAT* If patient is at the pharmacy, call can be transferred to refill team. ? ? ?1. Which medications need to be refilled? (please list name of each medication and dose if known)  ? losartan (COZAAR) 100 MG tablet   ? ? ?2. Which pharmacy/location (including street and city if local pharmacy) is medication to be sent to? [facility] [place] ? ?3. Do they need a 30 day or 90 day supply? 90 days ? ?Pt needs to be send today [facility] her [facility]  ?

## 2022-02-26 ENCOUNTER — Telehealth: Payer: Self-pay | Admitting: Cardiology

## 2022-02-26 NOTE — Telephone Encounter (Signed)
Called the patient to clarify the Hydralazine dosages. She stated that she takes 25 mg three times a day of the Hydralazine. If she gets elevated blood pressure readings, she will use the 10 mg prn and not the 25 mg. She stated that she rarely uses the 10 mg and that 30 pills may last 3 months. She wants to know if Dr. Percival Spanish can fill the prn 10 mg Hydralazine. ?

## 2022-02-26 NOTE — Telephone Encounter (Signed)
Pt c/o medication issue: ? ?1. Name of Medication:  ? hydrALAZINE (APRESOLINE) 10 MG tablet  ? ? ?2. How are you currently taking this medication (dosage and times per day)? Take 10 mg by mouth 3 (three) times daily as needed (high blood pressure).  ? ?3. Are you having a reaction (difficulty breathing--STAT)? No ? ?4. What is your medication issue? Pt wants to know if she needs to take medication with food ?

## 2022-02-26 NOTE — Telephone Encounter (Signed)
Pt c/o medication issue: ? ?1. Name of Medication: hydrALAZINE (APRESOLINE) 25 MG tablet ? ?2. How are you currently taking this medication (dosage and times per day)? 1 tablet 3x a day ? ?3. Are you having a reaction (difficulty breathing--STAT)? no ? ?4. What is your medication issue? Patient would like to know when was prescribed the medication. She says she was also prescribed the 10 mg and takes it as needed when her BP is high. She says she needs a refill of the 10 mg ? ? ?*STAT* If patient is at the pharmacy, call can be transferred to refill team. ? ? ?1. Which medications need to be refilled? (please list name of each medication and dose if known) hydrALAZINE (APRESOLINE) 10 MG tablet ? ?2. Which pharmacy/location (including street and city if local pharmacy) is medication to be sent to? [facility], [place] ? ?3. Do they need a 30 day or 90 day supply? 90 day ?

## 2022-03-02 MED ORDER — HYDRALAZINE HCL 10 MG PO TABS: 10.0000 mg | ORAL_TABLET | Freq: Three times a day (TID) | ORAL | 3 refills | Status: DC | PRN

## 2022-03-02 NOTE — Telephone Encounter (Signed)
Patient following up, again requesting advisement regarding Hydralazine. Please return call as able. ?

## 2022-03-02 NOTE — Telephone Encounter (Signed)
Spoke to patient she stated she takes Hydralazine 25 mg three times a day.Stated she takes Hydralazine 10 mg three times a day if needed.Stated she usually takes Hydralazine 10 mg once a week.Stated she needs 10 mg refilled.Refill sent to mail order pharmacy.I will make Dr.Hochrein aware. ?

## 2022-03-04 NOTE — Telephone Encounter (Signed)
Hydralazine 10 mg refill already sent to pharmacy. ?

## 2022-09-02 ENCOUNTER — Other Ambulatory Visit: Payer: Self-pay | Admitting: Cardiology

## 2022-09-05 DIAGNOSIS — R0989 Other specified symptoms and signs involving the circulatory and respiratory systems: Secondary | ICD-10-CM | POA: Insufficient documentation

## 2022-09-05 NOTE — Progress Notes (Unsigned)
Cardiology Office Note   Date:  09/05/2022   ID:  Sarah Armstrong, DOB 01/23/1931, MRN [id number]  PCP:  Allie Dimmer, MD  Cardiologist:   Minus Breeding, MD   No chief complaint on file.     History of Present Illness: Sarah Armstrong is a 88 y.o. female who presents for evaluation of HTN and atrial fibrillation. She was found to have this in January of 2017.  She had a renal ultrasound that was normal and negative metanephrines.   She was in the hospital for treatment of GI bleed in 2019.     Since I last saw her ***  ***  she has done relatively well.  She lives alone.  She gets around with a cane.  She does her own household chores.  She denies any new cardiovascular symptoms.  She has had no new shortness of breath, PND or orthopnea.  She is had no palpitations, presyncope or syncope.  She denies any chest pressure, neck or arm discomfort.  She is had no weight gain or edema.    Past Medical History:  Diagnosis Date   Atrial fibrillation ([place])    Cancer ([place]) skin   GERD (gastroesophageal reflux disease)    Hyperlipidemia    Hypertension    Stroke [facility])     Past Surgical History:  Procedure Laterality Date   CARDIOVERSION N/A 03/04/2016   Procedure: CARDIOVERSION;  Surgeon: Sueanne Margarita, MD;  Location: [place];  Service: Cardiovascular;  Laterality: N/A;   CATARACT EXTRACTION  2015   COLONOSCOPY  2009   COLONOSCOPY WITH PROPOFOL N/A 03/09/2018   Procedure: COLONOSCOPY WITH PROPOFOL;  Surgeon: Ronald Lobo, MD;  Location: [facility];  Service: Endoscopy;  Laterality: N/A;   ESOPHAGOGASTRODUODENOSCOPY (EGD) WITH PROPOFOL N/A 03/08/2018   Procedure: ESOPHAGOGASTRODUODENOSCOPY (EGD) WITH PROPOFOL;  Surgeon: Ronald Lobo, MD;  Location: [facility];  Service: Endoscopy;  Laterality: N/A;   GIVENS CAPSULE STUDY N/A 03/09/2018   Procedure: GIVENS CAPSULE STUDY;  Surgeon: Ronald Lobo, MD;  Location: [facility];  Service: Endoscopy;  Laterality: N/A;    TONSILLECTOMY  1953   TOTAL ABDOMINAL HYSTERECTOMY  1983     Current Outpatient Medications  Medication Sig Dispense Refill   acetaminophen (TYLENOL) 500 MG tablet Take 500 mg by mouth 2 (two) times daily as needed for mild pain.      amiodarone (PACERONE) 200 MG tablet TAKE 1 TABLET DAILY 90 tablet 2   atorvastatin (LIPITOR) 20 MG tablet Take 1 tablet by mouth daily. (Patient not taking: Reported on 10/01/2021)     cetirizine (ZYRTEC) 10 MG tablet Take 10 mg by mouth daily.     Cholecalciferol 50 MCG (2000 UT) TABS Take 1 tablet by mouth daily.     Cranberry 500 MG CAPS Take by mouth 3 (three) times a week. (Patient not taking: Reported on 10/01/2021)     Cranberry 500 MG CAPS Take 500 mg by mouth 3 (three) times a week. (Patient not taking: Reported on 10/01/2021)     diazepam (VALIUM) 5 MG tablet Take 5 mg by mouth daily as needed for anxiety.     diltiazem (CARDIZEM CD) 120 MG 24 hr capsule TAKE 1 CAPSULE DAILY 90 capsule 0   estradiol (ESTRACE) 0.1 MG/GM vaginal cream Place 1 Applicatorful vaginally 2 (two) times a week. Wed and Sun     famotidine (PEPCID) 20 MG tablet Take by mouth.     ferrous sulfate 325 (65 FE) MG tablet Take 1 tablet (325 mg  total) by mouth 3 (three) times daily with meals. (Patient not taking: Reported on 10/01/2021) 90 tablet 3   hydrALAZINE (APRESOLINE) 10 MG tablet Take 1 tablet (10 mg total) by mouth 3 (three) times daily as needed (high blood pressure). 90 tablet 3   hydrALAZINE (APRESOLINE) 25 MG tablet Take 1 tablet (25 mg total) by mouth 3 (three) times daily. 270 tablet 3   losartan (COZAAR) 100 MG tablet Take 1 tablet (100 mg total) by mouth daily. 90 tablet 3   Multiple Vitamins-Minerals (CENTRUM PO) Take 1 tablet by mouth daily. Not taking on Wed and Sun (Patient not taking: Reported on 10/01/2021)     Probiotic Product (PROBIOTIC FORMULA) CAPS Take 1 capsule by mouth daily as needed (prevent infection).      rivaroxaban (XARELTO) 15 MG TABS tablet Take 1  tablet (15 mg total) by mouth daily with supper. 90 tablet 3   No current facility-administered medications for this visit.    Allergies:   Aspirin, Other, Strawberry extract, Codeine, Enalapril, Melatonin, Penicillin g, Shellfish allergy, Shellfish-derived products, and Latex    ROS:  Please see the history of present illness.   Otherwise, review of systems are positive ***.  All other systems are reviewed and negative.    PHYSICAL EXAM: VS:  There were no vitals taken for this visit. , BMI There is no height or weight on file to calculate BMI.  GENERAL:  Well appearing NECK:  No jugular venous distention, waveform within normal limits, carotid upstroke brisk and symmetric, no bruits, no thyromegaly LUNGS:  Clear to auscultation bilaterally CHEST:  Unremarkable HEART:  PMI not displaced or sustained,S1 and S2 within normal limits, no S3, no S4, no clicks, no rubs, *** murmurs ABD:  Flat, positive bowel sounds normal in frequency in pitch, no bruits, no rebound, no guarding, no midline pulsatile mass, no hepatomegaly, no splenomegaly EXT:  2 plus pulses throughout, no edema, no cyanosis no clubbing    ***GENERAL:  Well appearing NECK:  No jugular venous distention, waveform within normal limits, carotid upstroke brisk and symmetric, no bruits, no thyromegaly LUNGS:  Clear to auscultation bilaterally CHEST:  Unremarkable HEART:  PMI not displaced or sustained,S1 and S2 within normal limits, no S3, no S4, no clicks, no rubs, no murmurs ABD:  Flat, positive bowel sounds normal in frequency in pitch, no bruits, no rebound, no guarding, no midline pulsatile mass, no hepatomegaly, no splenomegaly EXT:  2 plus pulses throughout, no edema, no cyanosis no clubbing   Sinus rhythm, rate ***, right axis deviation, no acute ST-T wave changes.   Recent Labs: 02/05/2022: ALT 27; BUN 11; Creatinine, Ser 1.16; Hemoglobin 13.1; Platelets 182; Potassium 3.9; Sodium 136    Lipid Panel No results  found for: "CHOL", "TRIG", "HDL", "CHOLHDL", "VLDL", "LDLCALC", "LDLDIRECT"    Wt Readings from Last 3 Encounters:  02/05/22 160 lb (72.6 kg)  10/01/21 157 lb 6.4 oz (71.4 kg)  09/16/20 163 lb (73.9 kg)      Other studies Reviewed: Additional studies/ records that were reviewed today include:   *** Review of the above records demonstrates:   **   ASSESSMENT AND PLAN:   ATRIAL FIB:  Sarah Armstrong has a CHA2DS2 - VASc score of 4.  ***   She has had no symptomatic paroxysms.   She did have thyroid done earlier this year and liver enzymes that were normal.  Have given her written instructions to get these done again early next year when she has  her next lab draw.  She tolerates anticoagulation and is up-to-date with CBC.  No change in therapy.  She is on the appropriate dose.   AKI:   ***  We will try to get a copy of her most recent creatinine.  Previously has been lower than 1.5.    Creat was 0.9 in April.    HTN:   The blood pressure is ***  slightly elevated.  She will keep a blood pressure diary and send this to me.   MR/TR:  ***    This has been mild.  I will follow this clinically.  BRUIT:   She had mild plaque in May of 2019.  ***   No further imaging is indicated.   DYSPNEA:    ***    Her breathing is at baseline.  No change in therapy.    Current medicines are reviewed at length with the patient today.  The patient does not have concerns regarding medicines.  The following changes have been made:   ***  Labs/ tests ordered today include:  ***  No orders of the defined types were placed in this encounter.     Disposition:   FU with me *** months.  Signed, Minus Breeding, MD  09/05/2022 9:35 PM    [facility]

## 2022-09-07 ENCOUNTER — Encounter: Payer: Self-pay | Admitting: Cardiology

## 2022-09-07 ENCOUNTER — Ambulatory Visit: Payer: Medicare Other | Attending: Cardiology | Admitting: Cardiology

## 2022-09-07 VITALS — BP 144/62 | HR 86 | Ht 67.0 in | Wt 161.6 lb

## 2022-09-07 DIAGNOSIS — N179 Acute kidney failure, unspecified: Secondary | ICD-10-CM | POA: Insufficient documentation

## 2022-09-07 DIAGNOSIS — I480 Paroxysmal atrial fibrillation: Secondary | ICD-10-CM | POA: Insufficient documentation

## 2022-09-07 DIAGNOSIS — I10 Essential (primary) hypertension: Secondary | ICD-10-CM | POA: Insufficient documentation

## 2022-09-07 DIAGNOSIS — R0602 Shortness of breath: Secondary | ICD-10-CM | POA: Diagnosis present

## 2022-09-07 DIAGNOSIS — R0989 Other specified symptoms and signs involving the circulatory and respiratory systems: Secondary | ICD-10-CM | POA: Diagnosis not present

## 2022-09-07 NOTE — Patient Instructions (Signed)
   Follow-Up: At [facility], you and your health needs are our priority.  As part of our continuing mission to provide you with exceptional heart care, we have created designated Provider Care Teams.  These Care Teams include your primary Cardiologist (physician) and Advanced Practice Providers (APPs -  Physician Assistants and Nurse Practitioners) who all work together to provide you with the care you need, when you need it.  We recommend signing up for the patient portal called "MyChart".  Sign up information is provided on this After Visit Summary.  MyChart is used to connect with patients for Virtual Visits (Telemedicine).  Patients are able to view lab/test results, encounter notes, upcoming appointments, etc.  Non-urgent messages can be sent to your provider as well.   To learn more about what you can do with MyChart, go to [email or web address].    Your next appointment:   12 month(s)  The format for your next appointment:   In Person  Provider:   James Hochrein, MD     

## 2022-10-10 ENCOUNTER — Telehealth: Payer: Self-pay

## 2022-10-10 NOTE — Telephone Encounter (Signed)
   Pre-operative Risk Assessment    Patient Name: Sarah Armstrong  DOB: 05/26/1931 MRN: [id number]      Request for Surgical Clearance    Procedure:   Lumbar ESI   Date of Surgery:  Clearance 10/19/22                                 Surgeon:  n/a Surgeon's Group or Practice Name:  [facility] Phone number:  [phone] ext: [place] Fax number:  [phone]   Type of Clearance Requested:   - Pharmacy:  Hold Rivaroxaban (Xarelto) 3 days   Type of Anesthesia:  Not Indicated   Additional requests/questions:    Erin Hearing   10/10/2022, 4:46 PM

## 2022-10-11 NOTE — Telephone Encounter (Signed)
   Patient Name: Sarah Armstrong  DOB: Feb 12, 1931 MRN: [id number]  Primary Cardiologist: Minus Breeding, MD  Chart reviewed as part of pre-operative protocol coverage. Pre-op clearance already addressed by colleagues in earlier phone notes. To summarize recommendations:  - Aside from her advanced age I see no high risk features to the planned procedure.  No further cardiovascular testing is indicated.  Recommendation for holding her anticoagulation for 3 days has been suggested by pharmacy.  -Dr. Percival Spanish  Per office protocol, patient can hold Xarelto for 3 days prior to procedure.   Patient will not need bridging with Lovenox (enoxaparin) around procedure.  Will route this bundled recommendation to requesting provider via Epic fax function and remove from pre-op pool. Please call with questions.  Elgie Collard, PA-C 10/11/2022, 3:28 PM

## 2022-10-11 NOTE — Telephone Encounter (Signed)
Patient with diagnosis of atrial fibrillation on Xarelto for anticoagulation.    Procedure:   Lumbar ESI    Date of Surgery:  Clearance 10/19/22       CHA2DS2-VASc Score = 4   This indicates a 4.8% annual risk of stroke. The patient's score is based upon: CHF History: 0 HTN History: 1 Diabetes History: 0 Stroke History: 0 Vascular Disease History: 0 Age Score: 2 Gender Score: 1   CrCl 45 Platelet count 190  Per office protocol, patient can hold Xarelto for 3 days prior to procedure.   Patient will not need bridging with Lovenox (enoxaparin) around procedure.  **This guidance is not considered finalized until pre-operative APP has relayed final recommendations.**

## 2022-11-08 ENCOUNTER — Other Ambulatory Visit: Payer: Self-pay | Admitting: Cardiology

## 2022-11-09 ENCOUNTER — Telehealth: Payer: Self-pay | Admitting: Cardiology

## 2022-11-09 MED ORDER — AMIODARONE HCL 200 MG PO TABS: 200.0000 mg | ORAL_TABLET | Freq: Every day | ORAL | 3 refills | Status: DC

## 2022-11-09 NOTE — Telephone Encounter (Signed)
*  STAT* If patient is at the pharmacy, call can be transferred to refill team.   1. Which medications need to be refilled? (please list name of each medication and dose if known)  Amiodarone   2. Which pharmacy/location (including street and city if local pharmacy) is medication to be sent to? [facility]  3. Do they need a 30 day or 90 day supply? 90 days and refills

## 2022-12-01 ENCOUNTER — Other Ambulatory Visit: Payer: Self-pay | Admitting: Cardiology

## 2023-01-30 ENCOUNTER — Emergency Department (HOSPITAL_BASED_OUTPATIENT_CLINIC_OR_DEPARTMENT_OTHER): Payer: Medicare Other

## 2023-01-30 ENCOUNTER — Emergency Department (HOSPITAL_BASED_OUTPATIENT_CLINIC_OR_DEPARTMENT_OTHER)
Admission: EM | Admit: 2023-01-30 | Discharge: 2023-01-30 | Disposition: A | Discharge status: Home / Self Care | Payer: Medicare Other | Attending: Emergency Medicine | Admitting: Emergency Medicine

## 2023-01-30 ENCOUNTER — Encounter (HOSPITAL_BASED_OUTPATIENT_CLINIC_OR_DEPARTMENT_OTHER): Payer: Self-pay | Admitting: Emergency Medicine

## 2023-01-30 ENCOUNTER — Other Ambulatory Visit: Payer: Self-pay

## 2023-01-30 DIAGNOSIS — R059 Cough, unspecified: Secondary | ICD-10-CM | POA: Diagnosis present

## 2023-01-30 DIAGNOSIS — J069 Acute upper respiratory infection, unspecified: Secondary | ICD-10-CM | POA: Diagnosis not present

## 2023-01-30 DIAGNOSIS — Z7901 Long term (current) use of anticoagulants: Secondary | ICD-10-CM | POA: Insufficient documentation

## 2023-01-30 DIAGNOSIS — Z91040 Latex allergy status: Secondary | ICD-10-CM | POA: Insufficient documentation

## 2023-01-30 DIAGNOSIS — I4891 Unspecified atrial fibrillation: Secondary | ICD-10-CM | POA: Insufficient documentation

## 2023-01-30 DIAGNOSIS — U071 COVID-19: Secondary | ICD-10-CM | POA: Insufficient documentation

## 2023-01-30 DIAGNOSIS — J40 Bronchitis, not specified as acute or chronic: Secondary | ICD-10-CM | POA: Insufficient documentation

## 2023-01-30 DIAGNOSIS — J209 Acute bronchitis, unspecified: Secondary | ICD-10-CM

## 2023-01-30 HISTORY — DX: COVID-19: U07.1

## 2023-01-30 LAB — CBC WITH DIFFERENTIAL/PLATELET
Abs Immature Granulocytes: 0.04 10*3/uL (ref 0.00–0.07)
Basophils Absolute: 0.1 10*3/uL (ref 0.0–0.1)
Basophils Relative: 1 %
Eosinophils Absolute: 0 10*3/uL (ref 0.0–0.5)
Eosinophils Relative: 0 %
HCT: 43.4 % (ref 36.0–46.0)
Hemoglobin: 14 g/dL (ref 12.0–15.0)
Immature Granulocytes: 0 %
Lymphocytes Relative: 13 %
Lymphs Abs: 1.4 10*3/uL (ref 0.7–4.0)
MCH: 30.1 pg (ref 26.0–34.0)
MCHC: 32.3 g/dL (ref 30.0–36.0)
MCV: 93.3 fL (ref 80.0–100.0)
Monocytes Absolute: 1.1 10*3/uL — ABNORMAL HIGH (ref 0.1–1.0)
Monocytes Relative: 11 %
Neutro Abs: 7.9 10*3/uL — ABNORMAL HIGH (ref 1.7–7.7)
Neutrophils Relative %: 75 %
Platelets: 382 10*3/uL (ref 150–400)
RBC: 4.65 MIL/uL (ref 3.87–5.11)
RDW: 14.6 % (ref 11.5–15.5)
WBC: 10.5 10*3/uL (ref 4.0–10.5)
nRBC: 0 % (ref 0.0–0.2)

## 2023-01-30 LAB — URINALYSIS, MICROSCOPIC (REFLEX)

## 2023-01-30 LAB — COMPREHENSIVE METABOLIC PANEL
ALT: 26 U/L (ref 0–44)
AST: 47 U/L — ABNORMAL HIGH (ref 15–41)
Albumin: 3.7 g/dL (ref 3.5–5.0)
Alkaline Phosphatase: 79 U/L (ref 38–126)
Anion gap: 8 (ref 5–15)
BUN: 12 mg/dL (ref 8–23)
CO2: 24 mmol/L (ref 22–32)
Calcium: 8.7 mg/dL — ABNORMAL LOW (ref 8.9–10.3)
Chloride: 102 mmol/L (ref 98–111)
Creatinine, Ser: 0.99 mg/dL (ref 0.44–1.00)
GFR, Estimated: 55 mL/min — ABNORMAL LOW (ref >60)
Glucose, Bld: 119 mg/dL — ABNORMAL HIGH (ref 70–99)
Potassium: 4.1 mmol/L (ref 3.5–5.1)
Sodium: 134 mmol/L — ABNORMAL LOW (ref 135–145)
Total Bilirubin: 1 mg/dL (ref 0.3–1.2)
Total Protein: 7.8 g/dL (ref 6.5–8.1)

## 2023-01-30 LAB — URINALYSIS, ROUTINE W REFLEX MICROSCOPIC
Bilirubin Urine: NEGATIVE
Glucose, UA: NEGATIVE mg/dL
Hgb urine dipstick: NEGATIVE
Ketones, ur: NEGATIVE mg/dL
Nitrite: NEGATIVE
Protein, ur: NEGATIVE mg/dL
Specific Gravity, Urine: 1.015 (ref 1.005–1.030)
pH: 6.5 (ref 5.0–8.0)

## 2023-01-30 LAB — RESP PANEL BY RT-PCR (RSV, FLU A&B, COVID)  RVPGX2
Influenza A by PCR: NEGATIVE
Influenza B by PCR: NEGATIVE
Resp Syncytial Virus by PCR: NEGATIVE
SARS Coronavirus 2 by RT PCR: POSITIVE — AB

## 2023-01-30 MED ORDER — METHYLPREDNISOLONE 4 MG PO TBPK: ORAL_TABLET | ORAL | 0 refills | Status: DC

## 2023-01-30 MED ORDER — DOXYCYCLINE HYCLATE 100 MG PO CAPS: 100.0000 mg | ORAL_CAPSULE | Freq: Two times a day (BID) | ORAL | 0 refills | Status: AC

## 2023-01-30 NOTE — ED Notes (Signed)
Patient transported to X-ray 

## 2023-01-30 NOTE — ED Notes (Signed)
Pt discharged to home. Discharge instructions have been discussed with patient and/or family members. Pt verbally acknowledges understanding d/c instructions, and endorses comprehension to checkout at registration before leaving.  °

## 2023-01-30 NOTE — ED Triage Notes (Signed)
Pt with continued cough, congestion, decreased appetite, diarrhea since Covid dx 2 wks ago

## 2023-01-30 NOTE — ED Provider Notes (Signed)
[facility] Provider Note   CSN: [id number] Arrival date & time: 01/30/23  1144     History  Chief Complaint  Patient presents with   Cough    Sarah Armstrong is a 89 y.o. female.  Patient here with ongoing cough after having COVID 2 weeks ago.  Finished antiviral.  Has a history of A-fib on Xarelto.  Still with cough and congestion.  Denies any fever or chills.  Decreased appetite.  No nausea or vomiting.  Has had some diarrhea.  Denies any abdominal pain.  No chest pain.  No leg swelling.  The history is provided by the patient.       Home Medications Prior to Admission medications   Medication Sig Start Date End Date Taking? Authorizing Provider  doxycycline (VIBRAMYCIN) 100 MG capsule Take 1 capsule (100 mg total) by mouth 2 (two) times daily for 7 days. 01/30/23 02/06/23 Yes Meriel Kelliher, DO  methylPREDNISolone (MEDROL DOSEPAK) 4 MG TBPK tablet Follow package insert 01/30/23  Yes Kamaree Wheatley, DO  acetaminophen (TYLENOL) 500 MG tablet Take 500 mg by mouth 2 (two) times daily as needed for mild pain.     [provider]  amiodarone (PACERONE) 200 MG tablet Take 1 tablet (200 mg total) by mouth daily. 11/09/22   Minus Breeding, MD  atorvastatin (LIPITOR) 20 MG tablet Take 1 tablet by mouth daily. 06/26/15   [provider]  cetirizine (ZYRTEC) 10 MG tablet Take 10 mg by mouth daily.    [provider]  Cholecalciferol 50 MCG (2000 UT) TABS Take 1 tablet by mouth daily.    [provider]  Cranberry 500 MG CAPS Take by mouth 3 (three) times a week.    [provider]  Cranberry 500 MG CAPS Take 500 mg by mouth 3 (three) times a week.    [provider]  diazepam (VALIUM) 5 MG tablet Take 5 mg by mouth daily as needed for anxiety.    [provider]  diltiazem (CARDIZEM CD) 120 MG 24 hr capsule TAKE 1 CAPSULE DAILY 12/01/22   Minus Breeding, MD  estradiol (ESTRACE) 0.1 MG/GM  vaginal cream Place 1 Applicatorful vaginally 2 (two) times a week. Wed and Sun    [provider]  famotidine (PEPCID) 20 MG tablet Take by mouth.    [provider]  ferrous sulfate 325 (65 FE) MG tablet Take 1 tablet (325 mg total) by mouth 3 (three) times daily with meals. 03/10/18   Charlynne Cousins, MD  hydrALAZINE (APRESOLINE) 10 MG tablet Take 1 tablet (10 mg total) by mouth 3 (three) times daily as needed (high blood pressure). 03/02/22   Minus Breeding, MD  hydrALAZINE (APRESOLINE) 25 MG tablet Take 1 tablet (25 mg total) by mouth 3 (three) times daily. 03/02/22   Minus Breeding, MD  losartan (COZAAR) 100 MG tablet Take 1 tablet (100 mg total) by mouth daily. 02/25/22 05/26/22  Minus Breeding, MD  Multiple Vitamins-Minerals (CENTRUM PO) Take 1 tablet by mouth daily. Not taking on Wed and Sun    [provider]  Probiotic Product (PROBIOTIC FORMULA) CAPS Take 1 capsule by mouth daily as needed (prevent infection).     [provider]  rivaroxaban (XARELTO) 15 MG TABS tablet Take 1 tablet (15 mg total) by mouth daily with supper. 05/04/16   Sherran Needs, NP      Allergies    Aspirin, Other, Strawberry extract, Codeine, Enalapril, Melatonin, Penicillin g, Shellfish  allergy, Shellfish-derived products, and Latex    Review of Systems   Review of Systems  Physical Exam Updated Vital Signs BP (!) 163/59   Pulse 95   Temp 98.4 F (36.9 C) (Oral)   Resp 20   Ht '5\' 7"'$  (1.702 m)   Wt 70.3 kg   SpO2 95%   BMI 24.28 kg/m  Physical Exam Vitals and nursing note reviewed.  Constitutional:      General: She is not in acute distress.    Appearance: She is well-developed. She is not ill-appearing.  HENT:     Head: Normocephalic and atraumatic.     Nose: Nose normal.     Mouth/Throat:     Mouth: Mucous membranes are moist.  Eyes:     Extraocular Movements: Extraocular movements intact.     Conjunctiva/sclera: Conjunctivae normal.     Pupils:  Pupils are equal, round, and reactive to light.  Cardiovascular:     Rate and Rhythm: Normal rate and regular rhythm.     Pulses: Normal pulses.     Heart sounds: Normal heart sounds. No murmur heard. Pulmonary:     Effort: Pulmonary effort is normal. No respiratory distress.     Breath sounds: Normal breath sounds.  Abdominal:     Palpations: Abdomen is soft.     Tenderness: There is no abdominal tenderness.  Musculoskeletal:        General: No swelling.     Cervical back: Normal range of motion and neck supple.  Skin:    General: Skin is warm and dry.     Capillary Refill: Capillary refill takes less than 2 seconds.  Neurological:     Mental Status: She is alert.  Psychiatric:        Mood and Affect: Mood normal.     ED Results / Procedures / Treatments   Labs (all labs ordered are listed, but only abnormal results are displayed) Labs Reviewed  RESP PANEL BY RT-PCR (RSV, FLU A&B, COVID)  RVPGX2 - Abnormal; Notable for the following components:      Result Value   SARS Coronavirus 2 by RT PCR POSITIVE (*)    All other components within normal limits  CBC WITH DIFFERENTIAL/PLATELET - Abnormal; Notable for the following components:   Neutro Abs 7.9 (*)    Monocytes Absolute 1.1 (*)    All other components within normal limits  COMPREHENSIVE METABOLIC PANEL - Abnormal; Notable for the following components:   Sodium 134 (*)    Glucose, Bld 119 (*)    Calcium 8.7 (*)    AST 47 (*)    GFR, Estimated 55 (*)    All other components within normal limits  URINALYSIS, ROUTINE W REFLEX MICROSCOPIC - Abnormal; Notable for the following components:   Leukocytes,Ua TRACE (*)    All other components within normal limits  URINALYSIS, MICROSCOPIC (REFLEX) - Abnormal; Notable for the following components:   Bacteria, UA FEW (*)    Non Squamous Epithelial PRESENT (*)    All other components within normal limits    EKG None  Radiology DG Chest 2 View  Result Date:  01/30/2023 CLINICAL DATA:  Cough. EXAM: CHEST - 2 VIEW COMPARISON:  Chest x-ray February 05, 2022 FINDINGS: Opacity in the lateral left lung base is similar since April 2019, likely scarring or atelectasis. Stable cardiomegaly. The hila and mediastinum are normal. No pneumothorax. No other acute abnormalities. IMPRESSION: 1. Opacity in the lateral left lung base is similar since April 2019, likely  scarring or atelectasis. 2. Stable cardiomegaly. 3. No other acute abnormalities. Electronically Signed   By: Dorise Bullion III M.D.   On: 01/30/2023 12:28    Procedures Procedures    Medications Ordered in ED Medications - No data to display  ED Course/ Medical Decision Making/ A&P                             Medical Decision Making Amount and/or Complexity of Data Reviewed Labs: ordered. Radiology: ordered.  Risk Prescription drug management.   [name] is here with cough.  History of A-fib on Xarelto.  COVID-positive a few weeks ago.  Finished antiviral.  Still having some cough and congestion.  Differential diagnosis likely ongoing postviral cough, bronchitis, pneumonia.  I have no concern for blood clot as she is on Xarelto.  She is not hypoxic or tachycardic and very well-appearing.  CBC, CMP, chest x-ray obtained per my review and interpretation unremarkable.  No significant anemia or electrolyte abnormality or kidney injury.  No pneumonia on chest x-ray per my review interpretation.  Overall we will treat with antibiotics and steroids for possible bronchitis.  Discharged in good condition.  Understands return precautions.  This chart was dictated using voice recognition software.  Despite best efforts to proofread,  errors can occur which can change the documentation meaning.         Final Clinical Impression(s) / ED Diagnoses Final diagnoses:  Viral URI with cough  Acute bronchitis, unspecified organism    Rx / DC Orders ED Discharge Orders          Ordered    doxycycline  (VIBRAMYCIN) 100 MG capsule  2 times daily        01/30/23 1515    methylPREDNISolone (MEDROL DOSEPAK) 4 MG TBPK tablet        01/30/23 1515              Alesandra Smart, DO 01/30/23 1519

## 2023-02-02 ENCOUNTER — Other Ambulatory Visit: Payer: Self-pay | Admitting: Cardiology

## 2023-02-18 ENCOUNTER — Telehealth: Payer: Self-pay | Admitting: *Deleted

## 2023-02-18 NOTE — Telephone Encounter (Signed)
   Pre-operative Risk Assessment    Patient Name: Sarah Armstrong  DOB: 1931/02/23 MRN: [id number]      Request for Surgical Clearance    Procedure:   LUMBAR CAUDAL INJECTION  Date of Surgery:  Clearance 03/03/23                                 Surgeon:   Surgeon's Group or Practice Name:  [facility] Phone number:  [phone] Fax number:  [phone]   Type of Clearance Requested:   - Pharmacy:  Hold Rivaroxaban (Xarelto) X'S 3 DAYS   Type of Anesthesia:  Not Indicated   Additional requests/questions:    Astrid Divine   02/18/2023, 3:24 PM

## 2023-02-18 NOTE — Telephone Encounter (Signed)
   Patient Name: Sarah Armstrong  DOB: August 11, 1931 MRN: [id number]  Primary Cardiologist: Minus Breeding, MD  Chart reviewed as part of pre-operative protocol coverage. Pre-op clearance already addressed by colleagues in earlier phone notes. To summarize recommendations:  -Per office protocol, patient can hold Xarelto for 3 days prior to procedure. Please restart when medically safe to do so,  Medical clearance not requested.   Will route this bundled recommendation to requesting provider via Epic fax function and remove from pre-op pool. Please call with questions.  Elgie Collard, PA-C 02/18/2023, 4:25 PM

## 2023-02-18 NOTE — Telephone Encounter (Signed)
Patient with diagnosis of afib on Xarelto for anticoagulation.    Procedure: lumbar injection Date of procedure: 03/03/23  CHA2DS2-VASc Score = 7  This indicates a 11.2% annual risk of stroke. The patient's score is based upon: CHF History: 0 HTN History: 1 Diabetes History: 0 Stroke History: 2 Vascular Disease History: 1 Age Score: 2 Gender Score: 1   Stroke in 1999, afib dx not until 2017.  CrCl 41mL/min Platelet count 382K  Per office protocol, patient can hold Xarelto for 3 days prior to procedure.    **This guidance is not considered finalized until pre-operative APP has relayed final recommendations.**

## 2023-04-27 ENCOUNTER — Telehealth: Payer: Self-pay | Admitting: Cardiology

## 2023-04-27 NOTE — Telephone Encounter (Signed)
Pt would like a callback to find out if its okay for her to start taking Celebrex for her back pain while being on her other medications. She stated she's allergic to Aspirin and was told she shouldn't take it since that's the case but she'd like to be advised by MD. Please advise

## 2023-04-29 NOTE — Telephone Encounter (Signed)
Celebrex has the potential to increase her blood pressure, cause renal damage, and increases the risk of bleeding with Xarelto. Recommend she reach out to PCP for other options.

## 2023-04-29 NOTE — Telephone Encounter (Signed)
Patient informed of clinical pharmacist recommendations not to take celebrex. She will contact pcp

## 2023-05-17 ENCOUNTER — Telehealth: Payer: Self-pay | Admitting: Cardiology

## 2023-05-17 NOTE — Telephone Encounter (Signed)
Patient started on synthroid 25 mcg.  She wants to make sure this dosage is OK and she wants you to be aware of her labs.  She asked if she can take her BP with her meals still.  Advised yes she can

## 2023-05-17 NOTE — Telephone Encounter (Signed)
Pt c/o medication issue:  1. Name of Medication:  Levothyroxine 25  2. How are you currently taking this medication (dosage and times per day)?  Once 30 minutes before a meal  3. Are you having a reaction (difficulty breathing--STAT)?   4. What is your medication issue?   Patient states she was put on for her thyroid. She would like to know if this will interfere with any cardiac medications. Please advise.

## 2023-06-20 ENCOUNTER — Other Ambulatory Visit: Payer: Self-pay | Admitting: *Deleted

## 2023-06-20 MED ORDER — HYDRALAZINE HCL 10 MG PO TABS: 10.0000 mg | ORAL_TABLET | Freq: Three times a day (TID) | ORAL | 0 refills | Status: AC | PRN

## 2023-09-21 NOTE — Progress Notes (Unsigned)
  Cardiology Office Note:   Date:  09/22/2023  ID:  Sarah Armstrong, DOB 1931-06-24, MRN [id number] PCP: Majel Homer, MD  [facility] Providers Cardiologist:  Rollene Rotunda, MD {  History of Present Illness:   Sarah Armstrong is a 89 y.o. female who presents for evaluation of HTN and atrial fibrillation. She was found to have this in January of 2017.  She had a renal ultrasound that was normal and negative metanephrines.   She was in the hospital for treatment of GI bleed in 2019.      Since I last saw her she has been started on Synthroid.  Her TSH has been followed by her primary provider.  Her blood pressure has been pretty well-controlled.  Occasionally she will have to take an extra hydralazine if she becomes emotionally upset that her blood pressure comes right back down.  For the most part she thinks it is well-controlled.   ROS: Balance and gait disturbance, involuntary leg twitching rarely.  Otherwise as stated in the HPI and negative for all other systems  Studies Reviewed:    EKG:   EKG Interpretation Date/Time:  Thursday September 22 2023 13:18:18 EDT Ventricular Rate:  83 PR Interval:  176 QRS Duration:  82 QT Interval:  412 QTC Calculation: 484 R Axis:   35  Text Interpretation: Normal sinus rhythm Poor anterior R wave progression When compared with ECG of 05-Feb-2022 12:07, Premature ventricular complexes are no longer Present Confirmed by Rollene Rotunda ([id number]) on 09/22/2023 1:33:19 PM     Risk Assessment/Calculations:     CHA2DS2-VASc Score = 7   This indicates a 11.2% annual risk of stroke. The patient's score is based upon: CHF History: 0 HTN History: 1 Diabetes History: 0 Stroke History: 2 Vascular Disease History: 1 Age Score: 2 Gender Score: 1    Physical Exam:   VS:  BP 124/78   Pulse 83   Ht 5\' 7"  (1.702 m)   Wt 158 lb (71.7 kg) Comment: self report, pt. in wheelchair  SpO2 94%   BMI 24.75 kg/m    Wt Readings from Last 3 Encounters:   09/22/23 158 lb (71.7 kg)  01/30/23 155 lb (70.3 kg)  09/07/22 161 lb 9.6 oz (73.3 kg)     GEN: Well nourished, well developed in no acute distress NECK: No JVD; No carotid bruits CARDIAC: RRR, no murmurs, rubs, gallops RESPIRATORY:  Clear to auscultation without rales, wheezing or rhonchi  ABDOMEN: Soft, non-tender, non-distended EXTREMITIES:  No edema; No deformity   ASSESSMENT AND PLAN:   ATRIAL FIB:  Ms. Sarah Armstrong has a CHA2DS2 - VASc score of 4.   She has not had any symptomatic paroxysms.  She is tolerating amiodarone and is up-to-date with her liver tests and her thyroid is being regulated.  She tolerates anticoagulation.  No change in therapy.    AKI:   The patient's last creatinine was most recently 1.0.  No change in therapy.    HTN:   The blood pressure is well-controlled on the meds as listed.  No change in therapy.    MR/TR:   This has been mild and I will follow her clinically.   BRUIT:   She had mild plaque in May of 2019.  No further imaging       Follow up with me in 12 months.   Signed, Rollene Rotunda, MD

## 2023-09-22 ENCOUNTER — Encounter: Payer: Self-pay | Admitting: Cardiology

## 2023-09-22 ENCOUNTER — Ambulatory Visit: Payer: Medicare Other | Attending: Cardiology | Admitting: Cardiology

## 2023-09-22 VITALS — BP 124/78 | HR 83 | Ht 67.0 in | Wt 158.0 lb

## 2023-09-22 DIAGNOSIS — I480 Paroxysmal atrial fibrillation: Secondary | ICD-10-CM | POA: Insufficient documentation

## 2023-09-22 DIAGNOSIS — I10 Essential (primary) hypertension: Secondary | ICD-10-CM | POA: Diagnosis present

## 2023-09-22 DIAGNOSIS — I959 Hypotension, unspecified: Secondary | ICD-10-CM | POA: Diagnosis present

## 2023-09-22 NOTE — Patient Instructions (Signed)

## 2023-10-31 ENCOUNTER — Other Ambulatory Visit: Payer: Self-pay | Admitting: Cardiology

## 2023-11-28 ENCOUNTER — Other Ambulatory Visit: Payer: Self-pay | Admitting: Cardiology

## 2023-12-24 ENCOUNTER — Encounter (HOSPITAL_BASED_OUTPATIENT_CLINIC_OR_DEPARTMENT_OTHER): Payer: Self-pay | Admitting: Emergency Medicine

## 2023-12-24 ENCOUNTER — Other Ambulatory Visit: Payer: Self-pay

## 2023-12-24 ENCOUNTER — Emergency Department (HOSPITAL_BASED_OUTPATIENT_CLINIC_OR_DEPARTMENT_OTHER)
Admission: EM | Admit: 2023-12-24 | Discharge: 2023-12-24 | Disposition: A | Discharge status: Home / Self Care | Payer: Medicare Other | Attending: Emergency Medicine | Admitting: Emergency Medicine

## 2023-12-24 ENCOUNTER — Emergency Department (HOSPITAL_BASED_OUTPATIENT_CLINIC_OR_DEPARTMENT_OTHER): Payer: Medicare Other

## 2023-12-24 DIAGNOSIS — N189 Chronic kidney disease, unspecified: Secondary | ICD-10-CM | POA: Insufficient documentation

## 2023-12-24 DIAGNOSIS — I4891 Unspecified atrial fibrillation: Secondary | ICD-10-CM | POA: Diagnosis not present

## 2023-12-24 DIAGNOSIS — I129 Hypertensive chronic kidney disease with stage 1 through stage 4 chronic kidney disease, or unspecified chronic kidney disease: Secondary | ICD-10-CM | POA: Diagnosis not present

## 2023-12-24 DIAGNOSIS — Z87440 Personal history of urinary (tract) infections: Secondary | ICD-10-CM | POA: Insufficient documentation

## 2023-12-24 DIAGNOSIS — Z7901 Long term (current) use of anticoagulants: Secondary | ICD-10-CM | POA: Diagnosis not present

## 2023-12-24 DIAGNOSIS — R059 Cough, unspecified: Secondary | ICD-10-CM | POA: Insufficient documentation

## 2023-12-24 DIAGNOSIS — R531 Weakness: Secondary | ICD-10-CM | POA: Diagnosis present

## 2023-12-24 DIAGNOSIS — Z79899 Other long term (current) drug therapy: Secondary | ICD-10-CM | POA: Insufficient documentation

## 2023-12-24 DIAGNOSIS — Z20822 Contact with and (suspected) exposure to covid-19: Secondary | ICD-10-CM | POA: Insufficient documentation

## 2023-12-24 DIAGNOSIS — B349 Viral infection, unspecified: Secondary | ICD-10-CM | POA: Diagnosis not present

## 2023-12-24 DIAGNOSIS — R5383 Other fatigue: Secondary | ICD-10-CM | POA: Insufficient documentation

## 2023-12-24 DIAGNOSIS — J984 Other disorders of lung: Secondary | ICD-10-CM | POA: Insufficient documentation

## 2023-12-24 DIAGNOSIS — Z91040 Latex allergy status: Secondary | ICD-10-CM | POA: Insufficient documentation

## 2023-12-24 LAB — URINALYSIS, W/ REFLEX TO CULTURE (INFECTION SUSPECTED)
Bilirubin Urine: NEGATIVE
Glucose, UA: NEGATIVE mg/dL
Hgb urine dipstick: NEGATIVE
Ketones, ur: NEGATIVE mg/dL
Leukocytes,Ua: NEGATIVE
Nitrite: NEGATIVE
Protein, ur: NEGATIVE mg/dL
Specific Gravity, Urine: 1.015 (ref 1.005–1.030)
pH: 7 (ref 5.0–8.0)

## 2023-12-24 LAB — COMPREHENSIVE METABOLIC PANEL
ALT: 18 U/L (ref 0–44)
AST: 28 U/L (ref 15–41)
Albumin: 3.7 g/dL (ref 3.5–5.0)
Alkaline Phosphatase: 58 U/L (ref 38–126)
Anion gap: 9 (ref 5–15)
BUN: 18 mg/dL (ref 8–23)
CO2: 25 mmol/L (ref 22–32)
Calcium: 9.1 mg/dL (ref 8.9–10.3)
Chloride: 103 mmol/L (ref 98–111)
Creatinine, Ser: 1.03 mg/dL — ABNORMAL HIGH (ref 0.44–1.00)
GFR, Estimated: 52 mL/min — ABNORMAL LOW (ref >60)
Glucose, Bld: 106 mg/dL — ABNORMAL HIGH (ref 70–99)
Potassium: 4.2 mmol/L (ref 3.5–5.1)
Sodium: 137 mmol/L (ref 135–145)
Total Bilirubin: 0.7 mg/dL (ref 0.0–1.2)
Total Protein: 7 g/dL (ref 6.5–8.1)

## 2023-12-24 LAB — RESP PANEL BY RT-PCR (RSV, FLU A&B, COVID)  RVPGX2
Influenza A by PCR: NEGATIVE
Influenza B by PCR: NEGATIVE
Resp Syncytial Virus by PCR: NEGATIVE
SARS Coronavirus 2 by RT PCR: NEGATIVE

## 2023-12-24 LAB — CBC WITH DIFFERENTIAL/PLATELET
Abs Immature Granulocytes: 0.01 10*3/uL (ref 0.00–0.07)
Basophils Absolute: 0 10*3/uL (ref 0.0–0.1)
Basophils Relative: 1 %
Eosinophils Absolute: 0.1 10*3/uL (ref 0.0–0.5)
Eosinophils Relative: 1 %
HCT: 32.5 % — ABNORMAL LOW (ref 36.0–46.0)
Hemoglobin: 9.9 g/dL — ABNORMAL LOW (ref 12.0–15.0)
Immature Granulocytes: 0 %
Lymphocytes Relative: 23 %
Lymphs Abs: 1.4 10*3/uL (ref 0.7–4.0)
MCH: 24.5 pg — ABNORMAL LOW (ref 26.0–34.0)
MCHC: 30.5 g/dL (ref 30.0–36.0)
MCV: 80.4 fL (ref 80.0–100.0)
Monocytes Absolute: 1.1 10*3/uL — ABNORMAL HIGH (ref 0.1–1.0)
Monocytes Relative: 19 %
Neutro Abs: 3.4 10*3/uL (ref 1.7–7.7)
Neutrophils Relative %: 56 %
Platelets: 207 10*3/uL (ref 150–400)
RBC: 4.04 MIL/uL (ref 3.87–5.11)
RDW: 19.5 % — ABNORMAL HIGH (ref 11.5–15.5)
WBC: 6 10*3/uL (ref 4.0–10.5)
nRBC: 0 % (ref 0.0–0.2)

## 2023-12-24 MED ORDER — SODIUM CHLORIDE 0.9 % IV BOLUS
500.0000 mL | Freq: Once | INTRAVENOUS | Status: AC
  Administered 2023-12-24: 500 mL via INTRAVENOUS

## 2023-12-24 NOTE — ED Notes (Signed)
Fall risk armband Fall risk socks Fall risk sign on door

## 2023-12-24 NOTE — Discharge Instructions (Signed)
As we discussed, you likely have viral illness  You tested negative so far for COVID and flu and RSV  You do not have a urinary tract infection and your chest x-ray did not show any pneumonia.  As we discussed, your hemoglobin is 9.9 which is very good for you.  If you cannot tolerate your iron pills, I recommend your doctor schedule you for iron infusion  See your doctor for follow-up  Return to ER if you have worse weakness, passing out, fever, vomiting

## 2023-12-24 NOTE — ED Provider Notes (Signed)
[place] [facility] Provider Note   CSN: [id number] Arrival date & time: 12/24/23  1611     History  Chief Complaint  Patient presents with   Weakness    Sarah Armstrong is a 89 y.o. female hx of iron deficiency anemia, hypertension, CKD, A-fib on Xarelto, here presenting with weakness.  Patient states that she just feels very tired and weak for several days now.  Patient was concerned that her iron level was low.  She was noted to have low iron level about a month ago but has not been very consistent taking her iron supplements.  She states that she now takes every other day.  She states that she is concerned that maybe she has a UTI as well.  She denies any frequency but gets frequent UTIs.  Patient lives at home by herself.  Denies any sick contacts.  Has nonproductive cough as well.  The history is provided by the patient.       Home Medications Prior to Admission medications   Medication Sig Start Date End Date Taking? Authorizing Provider  acetaminophen (TYLENOL) 500 MG tablet Take 500 mg by mouth 2 (two) times daily as needed for mild pain.     [provider]  amiodarone (PACERONE) 200 MG tablet TAKE 1 TABLET DAILY 10/31/23   Rollene Rotunda, MD  atorvastatin (LIPITOR) 20 MG tablet Take 1 tablet by mouth daily. Patient not taking: Reported on 09/22/2023 06/26/15   [provider]  cetirizine (ZYRTEC) 10 MG tablet Take 10 mg by mouth daily.    [provider]  Cholecalciferol 50 MCG (2000 UT) TABS Take 1 tablet by mouth daily.    [provider]  Cranberry 500 MG CAPS Take by mouth 3 (three) times a week. Patient not taking: Reported on 09/22/2023    [provider]  Cranberry 500 MG CAPS Take 500 mg by mouth 3 (three) times a week. Patient not taking: Reported on 09/22/2023    [provider]  diazepam (VALIUM) 5 MG tablet Take 5 mg by mouth daily as needed for anxiety.    [provider]  diltiazem (CARDIZEM CD) 120 MG 24 hr capsule TAKE 1 CAPSULE DAILY 11/28/23   Rollene Rotunda, MD  estradiol (ESTRACE) 0.1 MG/GM vaginal cream Place 1 Applicatorful vaginally 2 (two) times a week. Wed and Sun    [provider]  famotidine (PEPCID) 20 MG tablet Take by mouth.    [provider]  ferrous sulfate 325 (65 FE) MG tablet Take 1 tablet (325 mg total) by mouth 3 (three) times daily with meals. Patient not taking: Reported on 09/22/2023 03/10/18   Marinda Elk, MD  hydrALAZINE (APRESOLINE) 10 MG tablet Take 1 tablet (10 mg total) by mouth 3 (three) times daily as needed (high blood pressure). 06/20/23   Rollene Rotunda, MD  hydrALAZINE (APRESOLINE) 25 MG tablet Take 1 tablet (25 mg total) by mouth 3 (three) times daily. 03/02/22   Rollene Rotunda, MD  losartan (COZAAR) 100 MG tablet TAKE 1 TABLET DAILY 10/31/23   Rollene Rotunda, MD  methylPREDNISolone (MEDROL DOSEPAK) 4 MG TBPK tablet Follow package insert Patient not taking: Reported on 09/22/2023 01/30/23   Virgina Norfolk, DO  Multiple Vitamins-Minerals (CENTRUM PO) Take 1 tablet by mouth daily. Not taking on Wed and Sun    [provider]  Probiotic Product (PROBIOTIC FORMULA) CAPS Take 1 capsule by mouth daily as needed (prevent infection).     [provider]  rivaroxaban (XARELTO) 15 MG TABS tablet Take 1 tablet (15 mg total) by mouth daily with supper. 05/04/16   Newman Nip, NP      Allergies    Aspirin, Other, Strawberry extract, Codeine, Enalapril, Melatonin, Penicillin g, Shellfish allergy, Shellfish-derived products, and Latex    Review of Systems   Review of Systems  Constitutional:  Positive for fatigue.  Neurological:  Positive for weakness.  All other systems reviewed and are negative.   Physical Exam Updated Vital Signs BP (!) 155/53 (BP Location: Left Arm)   Pulse 73   Temp 97.8 F (36.6 C) (Oral)   Resp 17   Ht 5\' 7"  (1.702 m)   Wt 71.7 kg   SpO2  97%   BMI 24.75 kg/m  Physical Exam Vitals and nursing note reviewed.  HENT:     Head: Normocephalic.     Nose: Nose normal.     Mouth/Throat:     Mouth: Mucous membranes are dry.  Eyes:     Extraocular Movements: Extraocular movements intact.     Pupils: Pupils are equal, round, and reactive to light.  Cardiovascular:     Rate and Rhythm: Normal rate and regular rhythm.     Pulses: Normal pulses.     Heart sounds: Normal heart sounds.  Pulmonary:     Effort: Pulmonary effort is normal.     Breath sounds: Normal breath sounds.  Abdominal:     General: Abdomen is flat.     Palpations: Abdomen is soft.  Musculoskeletal:        General: Normal range of motion.     Cervical back: Normal range of motion and neck supple.  Skin:    General: Skin is warm.     Capillary Refill: Capillary refill takes less than 2 seconds.  Neurological:     General: No focal deficit present.     Mental Status: She is alert and oriented to person, place, and time.  Psychiatric:        Mood and Affect: Mood normal.        Behavior: Behavior normal.     ED Results / Procedures / Treatments   Labs (all labs ordered are listed, but only abnormal results are displayed) Labs Reviewed  RESP PANEL BY RT-PCR (RSV, FLU A&B, COVID)  [id number]  CBC WITH DIFFERENTIAL/PLATELET  COMPREHENSIVE METABOLIC PANEL  URINALYSIS, W/ REFLEX TO CULTURE (INFECTION SUSPECTED)    EKG None  Radiology No results found.  Procedures Procedures    Medications Ordered in ED Medications  sodium chloride 0.9 % bolus 500 mL (has no administration in time range)    ED Course/ Medical Decision Making/ A&P                                 Medical Decision Making Sarah Armstrong is a 89 y.o. female here presenting with weakness.  Differential is broad.  Consider anemia versus AKI versus hyponatremia versus UTI versus pneumonia versus viral infection.  Plan to get CBC CMP and UA and chest x-ray and COVID and flu and RSV test.   Will hydrate and reassess  7:20 PM I reviewed patient's labs and WBC normal, Hg is 9.9 which is baseline.  Creatinine is normal.  COVID and flu and RSV negative.  UA did not show any obvious UTI and chest x-ray did not show any obvious pneumonia.  Likely she had a viral syndrome.  She felt better after fluids.  Stable for discharge  Problems Addressed: Viral syndrome: acute illness or injury Weakness: acute illness or injury  Amount and/or Complexity of Data Reviewed Labs: ordered. Decision-making details documented in ED Course. Radiology: ordered and independent interpretation performed. Decision-making details documented in ED Course. ECG/medicine tests: ordered.    Final Clinical Impression(s) / ED Diagnoses Final diagnoses:  None    Rx / DC Orders ED Discharge Orders     None         Charlynne Pander, MD 12/24/23 1921

## 2023-12-24 NOTE — ED Triage Notes (Signed)
Pt with general weakness and fatigue x 2d; sts hemoglobin was low a couple months ago and was started on iron, but she takes is on and off; also concerned about possible UTI

## 2024-02-10 ENCOUNTER — Ambulatory Visit: Payer: Medicare Other | Admitting: Gastroenterology

## 2024-02-10 ENCOUNTER — Encounter: Payer: Self-pay | Admitting: Gastroenterology

## 2024-02-10 ENCOUNTER — Other Ambulatory Visit

## 2024-02-10 VITALS — BP 130/60 | HR 76 | Ht 67.0 in | Wt 158.0 lb

## 2024-02-10 DIAGNOSIS — I480 Paroxysmal atrial fibrillation: Secondary | ICD-10-CM

## 2024-02-10 DIAGNOSIS — K5909 Other constipation: Secondary | ICD-10-CM | POA: Diagnosis not present

## 2024-02-10 DIAGNOSIS — R140 Abdominal distension (gaseous): Secondary | ICD-10-CM

## 2024-02-10 DIAGNOSIS — K5900 Constipation, unspecified: Secondary | ICD-10-CM

## 2024-02-10 DIAGNOSIS — I4891 Unspecified atrial fibrillation: Secondary | ICD-10-CM

## 2024-02-10 DIAGNOSIS — D509 Iron deficiency anemia, unspecified: Secondary | ICD-10-CM

## 2024-02-10 LAB — CBC WITH DIFFERENTIAL/PLATELET
Basophils Absolute: 0 10*3/uL (ref 0.0–0.1)
Basophils Relative: 0.7 % (ref 0.0–3.0)
Eosinophils Absolute: 0.1 10*3/uL (ref 0.0–0.7)
Eosinophils Relative: 2.2 % (ref 0.0–5.0)
HCT: 31.1 % — ABNORMAL LOW (ref 36.0–46.0)
Hemoglobin: 10 g/dL — ABNORMAL LOW (ref 12.0–15.0)
Lymphocytes Relative: 19.6 % (ref 12.0–46.0)
Lymphs Abs: 1.2 10*3/uL (ref 0.7–4.0)
MCHC: 32.2 g/dL (ref 30.0–36.0)
MCV: 79.2 fl (ref 78.0–100.0)
Monocytes Absolute: 1.1 10*3/uL — ABNORMAL HIGH (ref 0.1–1.0)
Monocytes Relative: 17.7 % — ABNORMAL HIGH (ref 3.0–12.0)
Neutro Abs: 3.7 10*3/uL (ref 1.4–7.7)
Neutrophils Relative %: 59.8 % (ref 43.0–77.0)
Platelets: 215 10*3/uL (ref 150.0–400.0)
RBC: 3.93 Mil/uL (ref 3.87–5.11)
RDW: 18.9 % — ABNORMAL HIGH (ref 11.5–15.5)
WBC: 6.3 10*3/uL (ref 4.0–10.5)

## 2024-02-10 LAB — COMPREHENSIVE METABOLIC PANEL
ALT: 14 U/L (ref 0–35)
AST: 23 U/L (ref 0–37)
Albumin: 3.9 g/dL (ref 3.5–5.2)
Alkaline Phosphatase: 68 U/L (ref 39–117)
BUN: 13 mg/dL (ref 6–23)
CO2: 27 meq/L (ref 19–32)
Calcium: 9.2 mg/dL (ref 8.4–10.5)
Chloride: 103 meq/L (ref 96–112)
Creatinine, Ser: 0.97 mg/dL (ref 0.40–1.20)
GFR: 51.36 mL/min — ABNORMAL LOW (ref >60.00)
Glucose, Bld: 127 mg/dL — ABNORMAL HIGH (ref 70–99)
Potassium: 4.1 meq/L (ref 3.5–5.1)
Sodium: 139 meq/L (ref 135–145)
Total Bilirubin: 0.5 mg/dL (ref 0.2–1.2)
Total Protein: 6.9 g/dL (ref 6.0–8.3)

## 2024-02-10 LAB — IBC + FERRITIN
Ferritin: 5.7 ng/mL — ABNORMAL LOW (ref 10.0–291.0)
Iron: 8 ug/dL — ABNORMAL LOW (ref 42–145)
Saturation Ratios: 1.6 % — ABNORMAL LOW (ref 20.0–50.0)
TIBC: 512.4 ug/dL — ABNORMAL HIGH (ref 250.0–450.0)
Transferrin: 366 mg/dL — ABNORMAL HIGH (ref 212.0–360.0)

## 2024-02-10 LAB — B12 AND FOLATE PANEL
Folate: 13.9 ng/mL (ref >5.9)
Vitamin B-12: 218 pg/mL (ref 211–911)

## 2024-02-10 NOTE — Progress Notes (Signed)
 Chief Complaint: constipation Primary GI MD: Gentry Fitz  HPI: 89 year old female history of A-fib (on Xarelto), CVA, hypertension, presents for evaluation of constipation.  Previous patient of [facility] last seen in 2017.  We do not have the report available but appears she underwent EGD 06/2016 for GERD which showed benign stomach polyps, biopsies of stomach negative for H. pylori.  Mild esophagitis and a hiatal hernia.  History of iron deficiency anemia and heme positive stool during hospitalization April 2019 and underwent EGD/colonoscopy which showed few tubular adenomas and erythematous mucosa in gastric body but no obvious source of anemia.  She also had negative VCE.  Recent visit to hospital January 2025 for weakness in which labs showed hemoglobin 9.9 (drift from 14.0 1 year prior) and CMP BUN 18, CR 1.03, GFR 52.  -----------TODAY--------------------  She experiences bloating and gassiness, and occasionally constipation. Bowel movements occur approximately every three days, often requiring the use of glycerin suppositories to facilitate defecation. She has tried MiraLAX in the past, which she found effective, but is not currently using it. She sometimes uses a makeshift stool to elevate her feet during bowel movements, which she believes helps with her symptoms.  She mentions having hemorrhoids and sometimes uses Imodium when experiencing loose stools.  Her daughter feels her loose stools are secondary to constipation.  Her hemorrhoids do not bleed and are not painful.  She was previously on iron but stopped taking it as it caused her to have nausea.     PREVIOUS GI WORKUP   Video capsule endoscopy 02/2018 Unremarkable small bowel study  Colonoscopy inpatient by Dr. Matthias Hughs 02/2018 for iron deficiency anemia - No source of anemia endoscopically evident. No blood in the colonic lumen.  - Three 4 to 6 mm polyps in the transverse colon and in the cecum, removed with a cold  snare. Resected and retrieved.  - Diverticulosis in the sigmoid colon.  - The examined portion of the ileum was normal.  - The distal rectum and anal verge are normal on retroflexion view.  Diagnosis 1. Colon, polyp(s), transverse - MULTIPLE FRAGMENTS OF TUBULAR ADENOMA(S) - NO HIGH GRADE DYSPLASIA OR MALIGNANCY IDENTIFIED 2. Colon, polyp(s), cecal - TUBULAR ADENOMA (2 OF 2 FRAGMENTS) - NO HIGH GRADE DYSPLASIA OR MALIGNANCY IDENTIFIED 3. Colon, polyp(s), transverse - TUBULAR ADENOMA (1 OF 1 FRAGMENTS) - NO HIGH GRADE DYSPLASIA OR MALIGNANCY IDENTIFIED  EGD and patient 02/2018 for an deficiency anemia - No source of anemia endoscopically evident.  - Normal larynx.  - Benign- appearing esophageal stenoses, which, by history, are essentially asymptomatic. - Small hiatal hernia.  - Widely patent and non- obstructing Schatzki ring.  - Erythematous mucosa in the gastric body and antrum, without erosive changes.  - Normal examined duodenum. Biopsied to confirm absence of celiac disease as cause for patient' s anemia. (Normal biopsies)  EGD 06/2016 at [facility] - Mild esophagitis - Hiatal hernia - Biopsies negative for H. pylori, celiac disease - Stomach polyps were benign biopsies   Past Medical History:  Diagnosis Date   Atrial fibrillation (HCC)    Cancer (HCC) skin   COVID    GERD (gastroesophageal reflux disease)    Hyperlipidemia    Hypertension    Stroke [facility])     Past Surgical History:  Procedure Laterality Date   CARDIOVERSION N/A 03/04/2016   Procedure: CARDIOVERSION;  Surgeon: Quintella Reichert, MD;  Location: [facility] ENDOSCOPY;  Service: Cardiovascular;  Laterality: N/A;   CATARACT EXTRACTION  2015  COLONOSCOPY  2009   COLONOSCOPY WITH PROPOFOL N/A 03/09/2018   Procedure: COLONOSCOPY WITH PROPOFOL;  Surgeon: Bernette Redbird, MD;  Location: [facility] ENDOSCOPY;  Service: Endoscopy;  Laterality: N/A;   ESOPHAGOGASTRODUODENOSCOPY (EGD) WITH PROPOFOL N/A 03/08/2018   Procedure:  ESOPHAGOGASTRODUODENOSCOPY (EGD) WITH PROPOFOL;  Surgeon: Bernette Redbird, MD;  Location: [facility] ENDOSCOPY;  Service: Endoscopy;  Laterality: N/A;   GIVENS CAPSULE STUDY N/A 03/09/2018   Procedure: GIVENS CAPSULE STUDY;  Surgeon: Bernette Redbird, MD;  Location: [facility] ENDOSCOPY;  Service: Endoscopy;  Laterality: N/A;   TONSILLECTOMY  1953   TOTAL ABDOMINAL HYSTERECTOMY  1983    Current Outpatient Medications  Medication Sig Dispense Refill   acetaminophen (TYLENOL) 500 MG tablet Take 500 mg by mouth 2 (two) times daily as needed for mild pain.      amiodarone (PACERONE) 200 MG tablet TAKE 1 TABLET DAILY 90 tablet 3   cetirizine (ZYRTEC) 10 MG tablet Take 10 mg by mouth daily.     Cholecalciferol 50 MCG (2000 UT) TABS Take 1 tablet by mouth daily.     diazepam (VALIUM) 5 MG tablet Take 5 mg by mouth daily as needed for anxiety.     diltiazem (CARDIZEM CD) 120 MG 24 hr capsule TAKE 1 CAPSULE DAILY 90 capsule 3   estradiol (ESTRACE) 0.1 MG/GM vaginal cream Place 1 Applicatorful vaginally 2 (two) times a week. Wed and Sun     famotidine (PEPCID) 20 MG tablet Take by mouth.     hydrALAZINE (APRESOLINE) 10 MG tablet Take 1 tablet (10 mg total) by mouth 3 (three) times daily as needed (high blood pressure). 90 tablet 0   hydrALAZINE (APRESOLINE) 25 MG tablet Take 1 tablet (25 mg total) by mouth 3 (three) times daily. 270 tablet 3   levothyroxine (SYNTHROID) 25 MCG tablet Take 25 mcg by mouth daily before breakfast.     lidocaine (LIDODERM) 5 % APPLY 1 PATCH TOPICALLY TO THE SKIN EVERY DAY. MAY WEAR UP TO 12 HOURS     loperamide (IMODIUM A-D) 2 MG tablet Take by mouth.     losartan (COZAAR) 100 MG tablet TAKE 1 TABLET DAILY 90 tablet 3   Multiple Vitamins-Minerals (CENTRUM PO) Take 1 tablet by mouth daily. Not taking on Wed and Sun     nystatin (MYCOSTATIN/NYSTOP) powder Apply 1 Application topically as needed.     Probiotic Product (PROBIOTIC FORMULA) CAPS Take 1 capsule by mouth daily as needed (prevent  infection).      rivaroxaban (XARELTO) 15 MG TABS tablet Take 1 tablet (15 mg total) by mouth daily with supper. 90 tablet 3   No current facility-administered medications for this visit.    Allergies as of 02/10/2024 - Review Complete 02/10/2024  Allergen Reaction Noted   Aspirin Anaphylaxis and Swelling 11/25/2009   Other Anaphylaxis and Swelling 02/13/2016   Strawberry extract Anaphylaxis 02/13/2016   Codeine Other (See Comments) 02/13/2016   Enalapril Other (See Comments) 03/03/2016   Melatonin Nausea Only 11/12/2016   Penicillin g Other (See Comments) 02/13/2016   Shellfish allergy  11/25/2009   Shellfish-derived products Other (See Comments) 02/13/2016   Latex Rash 09/09/2016    Family History  Problem Relation Age of Onset   Hypertension Mother    Heart disease Father        CHF   Stroke Maternal Grandmother    Cancer Maternal Grandfather    Heart disease Brother 70   Leukemia Brother    Heart disease Brother 7   COPD Sister  Parkinson's disease Sister    Hypertension Child    Diabetes Child     Social History   Socioeconomic History   Marital status: Widowed    Spouse name: Not on file   Number of children: 2   Years of education: Not on file   Highest education level: Not on file  Occupational History   Not on file  Tobacco Use   Smoking status: Never   Smokeless tobacco: Never  Vaping Use   Vaping status: Never Used  Substance and Sexual Activity   Alcohol use: No    Alcohol/week: 0.0 standard drinks of alcohol   Drug use: No   Sexual activity: Not on file  Other Topics Concern   Not on file  Social History Narrative   Lives alone.  Husband died 2016-02-24   Social Drivers of Corporate investment banker Strain: Not on file  Food Insecurity: Not on file  Transportation Needs: Not on file  Physical Activity: Not on file  Stress: Not on file  Social Connections: Not on file  Intimate Partner Violence: Not on file    Review of Systems:     Constitutional: No weight loss, fever, chills, weakness or fatigue HEENT: Eyes: No change in vision               Ears, Nose, Throat:  No change in hearing or congestion Skin: No rash or itching Cardiovascular: No chest pain, chest pressure or palpitations   Respiratory: No SOB or cough Gastrointestinal: See HPI and otherwise negative Genitourinary: No dysuria or change in urinary frequency Neurological: No headache, dizziness or syncope Musculoskeletal: No new muscle or joint pain Hematologic: No bleeding or bruising Psychiatric: No history of depression or anxiety    Physical Exam:  Vital signs: BP 130/60 (BP Location: Left Arm, Patient Position: Sitting, Cuff Size: Normal)   Pulse 76   Ht 5\' 7"  (1.702 m)   Wt 158 lb (71.7 kg)   BMI 24.75 kg/m   Constitutional: NAD, Well developed, Well nourished, alert and cooperative.  Appears younger than stated age.  In wheelchair. Head:  Normocephalic and atraumatic. Eyes:   PEERL, EOMI. No icterus. Conjunctiva pink. Respiratory: Respirations even and unlabored. Lungs clear to auscultation bilaterally.   No wheezes, crackles, or rhonchi.  Cardiovascular:  Regular rate and rhythm. No peripheral edema, cyanosis or pallor.  Gastrointestinal:  Soft, nondistended, nontender. No rebound or guarding. Normal bowel sounds. No appreciable masses or hepatomegaly. Rectal:  Not performed.  Msk:  Symmetrical without gross deformities. Without edema, no deformity or joint abnormality.  Neurologic:  Alert and  oriented x4;  grossly normal neurologically.  Skin:   Dry and intact without significant lesions or rashes. Psychiatric: Oriented to person, place and time. Demonstrates good judgement and reason without abnormal affect or behaviors.   RELEVANT LABS AND IMAGING: CBC    Component Value Date/Time   WBC 6.0 12/24/2023 1733   RBC 4.04 12/24/2023 1733   HGB 9.9 (L) 12/24/2023 1733   HCT 32.5 (L) 12/24/2023 1733   HCT 30.2 (L) 03/08/2018 1530    PLT 207 12/24/2023 1733   MCV 80.4 12/24/2023 1733   MCH 24.5 (L) 12/24/2023 1733   MCHC 30.5 12/24/2023 1733   RDW 19.5 (H) 12/24/2023 1733   LYMPHSABS 1.4 12/24/2023 1733   MONOABS 1.1 (H) 12/24/2023 1733   EOSABS 0.1 12/24/2023 1733   BASOSABS 0.0 12/24/2023 1733    CMP     Component Value Date/Time   NA  137 12/24/2023 1733   K 4.2 12/24/2023 1733   CL 103 12/24/2023 1733   CO2 25 12/24/2023 1733   GLUCOSE 106 (H) 12/24/2023 1733   BUN 18 12/24/2023 1733   CREATININE 1.03 (H) 12/24/2023 1733   CREATININE 0.96 (H) 02/13/2016 1609   CALCIUM 9.1 12/24/2023 1733   PROT 7.0 12/24/2023 1733   ALBUMIN 3.7 12/24/2023 1733   AST 28 12/24/2023 1733   ALT 18 12/24/2023 1733   ALKPHOS 58 12/24/2023 1733   BILITOT 0.7 12/24/2023 1733   GFRNONAA 52 (L) 12/24/2023 1733   GFRAA 47 (L) 03/07/2018 1410     Assessment/Plan:      Chronic constipation Chronic constipation with bloating and gas. MiraLAX previously effective. Consistent use recommended. FODMAP diet may help identify gas-causing foods. -- Initiate MiraLAX at half a capful daily, adjust dose based on response. -- Educated on consistent MiraLAX use for effectiveness. -- Provided FODMAP diet handout. -- Advised against Imodium -- Recommended use of a squatty potty or similar device. - Follow-up 6 to 8 weeks  Iron deficiency anemia Chronic iron deficiency anemia in the setting of Xarelto use with extensive workup in 2019 with EGD, colonoscopy, video capsule endoscopy which was overall unrevealing. oral iron not tolerated due to gastrointestinal side effects. No overt bleeding. -CBC, CMP, iron studies and ferritin, vitamin B12/folate today - If continued iron deficiency anemia we can set up patient for iron infusion since she is unable to tolerate oral tablets - Had extensive workup in the past.  No overt bleeding.  If worsening anemia not responding to iron or overt bleeding occurs could reconsider repeat procedures but will  hold off at this time due to age and comorbidities  A-fib On Xarelto     Assigned to Dr. Adela Lank today (requesting Dr. Adela Lank as her daughter who is present with her today also sees Dr. Adela Lank)   Boone Master, PA-C [facility] 02/10/2024, 2:20 PM  Cc: Majel Homer, MD

## 2024-02-10 NOTE — Patient Instructions (Addendum)
 Start taking Miralax 1/2 capful (17 grams) 1x / day for 1 week.   If this is not effective, increase to 1 dose 2x / day for 1 week.   If this is still not effective, increase to two capfuls (34 grams) 2x / day.   Can adjust dose as needed based on response. Can take 1/2 cap daily, skip days, or increase per day.    Your provider has requested that you go to the basement level for lab work before leaving today. Press "B" on the elevator. The lab is located at the first door on the left as you exit the elevator.  Due to recent changes in healthcare laws, you may see the results of your imaging and laboratory studies on MyChart before your provider has had a chance to review them.  We understand that in some cases there may be results that are confusing or concerning to you. Not all laboratory results come back in the same time frame and the provider may be waiting for multiple results in order to interpret others.  Please give [place] 48 hours in order for your provider to thoroughly review all the results before contacting the office for clarification of your results.   Thank you for trusting me with your gastrointestinal care!   Boone Master, PA

## 2024-02-10 NOTE — Progress Notes (Signed)
 Agree with assessment and plan as outlined.  Patient has had an extensive evaluation for IDA in the past with negative panendoscopy. Looks like she has persistent IDA in the setting of Xarelto. I think she would benefit from IV iron infusion.  I agree given her prior workup, age / comobidities, would hope to avoid endoscopic evaluation if possible. I don't see any imaging done on file ever for her. If she wanted to pursue a workup for this, would do CT abdomen / pelvis to rule out any underlying malignancy as next step, which I think is reasonable.

## 2024-02-17 ENCOUNTER — Telehealth: Payer: Self-pay | Admitting: Gastroenterology

## 2024-02-17 ENCOUNTER — Other Ambulatory Visit: Payer: Self-pay

## 2024-02-17 DIAGNOSIS — D509 Iron deficiency anemia, unspecified: Secondary | ICD-10-CM

## 2024-02-17 NOTE — Telephone Encounter (Signed)
 Spoke with pts daughter and she is aware of lab results and recommendations per Cira Servant PA. Referral entered in epic for hematology for IV Iron. Daughter given the phone number for the cancer center to check on appt next week.

## 2024-02-17 NOTE — Telephone Encounter (Signed)
 Inbound call from patients daughter Jasmine December requesting a call from the nurse to discuss lab work. Please advise.

## 2024-03-15 ENCOUNTER — Telehealth: Payer: Self-pay

## 2024-03-15 NOTE — Telephone Encounter (Signed)
No answer and no message left.

## 2024-03-16 ENCOUNTER — Inpatient Hospital Stay: Attending: Hematology and Oncology

## 2024-03-16 ENCOUNTER — Other Ambulatory Visit: Payer: Self-pay | Admitting: *Deleted

## 2024-03-16 ENCOUNTER — Inpatient Hospital Stay (HOSPITAL_BASED_OUTPATIENT_CLINIC_OR_DEPARTMENT_OTHER): Admitting: Hematology and Oncology

## 2024-03-16 ENCOUNTER — Telehealth: Payer: Self-pay | Admitting: *Deleted

## 2024-03-16 VITALS — BP 161/46 | HR 89 | Temp 97.7°F | Resp 15 | Wt 160.4 lb

## 2024-03-16 DIAGNOSIS — I4891 Unspecified atrial fibrillation: Secondary | ICD-10-CM

## 2024-03-16 DIAGNOSIS — Z79899 Other long term (current) drug therapy: Secondary | ICD-10-CM | POA: Insufficient documentation

## 2024-03-16 DIAGNOSIS — I10 Essential (primary) hypertension: Secondary | ICD-10-CM | POA: Insufficient documentation

## 2024-03-16 DIAGNOSIS — Z7901 Long term (current) use of anticoagulants: Secondary | ICD-10-CM

## 2024-03-16 DIAGNOSIS — D509 Iron deficiency anemia, unspecified: Secondary | ICD-10-CM | POA: Insufficient documentation

## 2024-03-16 LAB — CBC WITH DIFFERENTIAL/PLATELET
Abs Immature Granulocytes: 0.02 10*3/uL (ref 0.00–0.07)
Basophils Absolute: 0.1 10*3/uL (ref 0.0–0.1)
Basophils Relative: 1 %
Eosinophils Absolute: 0.1 10*3/uL (ref 0.0–0.5)
Eosinophils Relative: 2 %
HCT: 29.4 % — ABNORMAL LOW (ref 36.0–46.0)
Hemoglobin: 9 g/dL — ABNORMAL LOW (ref 12.0–15.0)
Immature Granulocytes: 0 %
Lymphocytes Relative: 18 %
Lymphs Abs: 1.2 10*3/uL (ref 0.7–4.0)
MCH: 23.1 pg — ABNORMAL LOW (ref 26.0–34.0)
MCHC: 30.6 g/dL (ref 30.0–36.0)
MCV: 75.6 fL — ABNORMAL LOW (ref 80.0–100.0)
Monocytes Absolute: 1 10*3/uL (ref 0.1–1.0)
Monocytes Relative: 15 %
Neutro Abs: 4.2 10*3/uL (ref 1.7–7.7)
Neutrophils Relative %: 64 %
Platelets: 235 10*3/uL (ref 150–400)
RBC: 3.89 MIL/uL (ref 3.87–5.11)
RDW: 17.2 % — ABNORMAL HIGH (ref 11.5–15.5)
WBC: 6.6 10*3/uL (ref 4.0–10.5)
nRBC: 0 % (ref 0.0–0.2)

## 2024-03-16 LAB — IRON AND IRON BINDING CAPACITY (CC-WL,HP ONLY)
Iron: 16 ug/dL — ABNORMAL LOW (ref 28–170)
Saturation Ratios: 3 % — ABNORMAL LOW (ref 10.4–31.8)
TIBC: 568 ug/dL — ABNORMAL HIGH (ref 250–450)
UIBC: 552 ug/dL — ABNORMAL HIGH (ref 148–442)

## 2024-03-16 LAB — VITAMIN B12: Vitamin B-12: 410 pg/mL (ref 180–914)

## 2024-03-16 LAB — FERRITIN: Ferritin: 4 ng/mL — ABNORMAL LOW (ref 11–307)

## 2024-03-16 NOTE — Assessment & Plan Note (Signed)
 Assessment and Plan Assessment & Plan Iron Deficiency Anemia Chronic iron deficiency anemia with moderate anemia. Intolerance to high-dose oral iron. Possible hemorrhoidal bleeding. Discussed IV iron infusion and potential side effects including but not limited to flu like symptoms, allergic reaction including anaphylaxis. Decision pending blood work and preference. -CBC, Iron panel and ferritin today - Order blood work to assess hemoglobin and iron levels. - Consider IV iron infusion pending blood work results and Therapist, occupational. - Continue oral iron supplementation with ferrous sulfate  45 mg if tolerated. - Continue vitamin B12 supplementation due to low B12 levels. - She is otherwise doing well with no concerns of bleeding per rectum except a rare episode from her hemorrhoids, so I dont believe this warrants another scope given her advanced age. - She also mentions no FH of colon cancer  Atrial Fibrillation Chronic atrial fibrillation managed with Xarelto . Previous epistaxis resolved with dose adjustment. - Continue Xarelto  15 mg daily.  Hypertension Chronic hypertension well-managed with current medication. - Continue current antihypertensive regimen.

## 2024-03-16 NOTE — Progress Notes (Signed)
 Labs faxed to pt provider to draw labs before pt office visit with Dr. Arno Bibles. Labs faxed to [phone]. Receipt of confirmation received.

## 2024-03-16 NOTE — Progress Notes (Signed)
 [facility] CONSULT NOTE  Patient Care Team: Harolyn Likes, MD as PCP - General (Internal Medicine) Eilleen Grates, MD as PCP - Cardiology (Cardiology)  CHIEF COMPLAINTS/PURPOSE OF CONSULTATION:  IDA  ASSESSMENT & PLAN:  IDA (iron deficiency anemia) Assessment and Plan Assessment & Plan Iron Deficiency Anemia Chronic iron deficiency anemia with moderate anemia. Intolerance to high-dose oral iron. Possible hemorrhoidal bleeding. Discussed IV iron infusion and potential side effects including but not limited to flu like symptoms, allergic reaction including anaphylaxis. Decision pending blood work and preference. -CBC, Iron panel and ferritin today - Order blood work to assess hemoglobin and iron levels. - Consider IV iron infusion pending blood work results and Therapist, occupational. - Continue oral iron supplementation with ferrous sulfate  45 mg if tolerated. - Continue vitamin B12 supplementation due to low B12 levels. - She is otherwise doing well with no concerns of bleeding per rectum except a rare episode from her hemorrhoids, so I dont believe this warrants another scope given her advanced age. - She also mentions no FH of colon cancer  Atrial Fibrillation Chronic atrial fibrillation managed with Xarelto . Previous epistaxis resolved with dose adjustment. - Continue Xarelto  15 mg daily.  Hypertension Chronic hypertension well-managed with current medication. - Continue current antihypertensive regimen.   Orders Placed This Encounter  Procedures   CBC with Differential/Platelet    Standing Status:   Standing    Number of Occurrences:   22    Expiration Date:   03/16/2025   Iron and Iron Binding Capacity ([facility] only)    Standing Status:   Future    Expiration Date:   03/16/2025   Ferritin    Standing Status:   Future    Expiration Date:   03/16/2025   Vitamin B12    Standing Status:   Future    Expiration Date:   03/16/2025     HISTORY OF  PRESENTING ILLNESS:  Sarah Armstrong 89 y.o. female is here because of IDA  Discussed the use of AI scribe software for clinical note transcription with the patient, who gave verbal consent to proceed.  History of Present Illness Sarah Armstrong is a 89 year old female with iron deficiency anemia who presents for evaluation of her anemia and consideration of IV iron therapy.  She has a history of iron deficiency anemia since 2019. Prior workup included upper and lower endoscopies and a capsule endoscopy, none of which identified a source of bleeding. She was previously treated with oral iron supplements, which caused diarrhea, leading her to discontinue them after a couple of weeks. She has since been taking B12 supplements and trying to consume iron-rich foods.  She reports intermittent blood in her stool, which has not been localized to a specific source. She has internal and external hemorrhoids that occasionally bleed, especially during episodes of diarrhea. No black stools have been observed.  She experiences symptoms of anemia, including feeling more out of breath and having a heart rate of about 90 when performing activities such as getting dressed and preparing breakfast. These symptoms have been present for some time.  Her current medications include a blood thinner, Xarelto , which initially caused epistaxis at a higher dose but has been well-tolerated at a reduced dose of 15 mg. She also takes medication for blood pressure and has a tube in one ear due to fluid behind the eardrum.  She lives alone, occasionally drives, and uses a call button for emergencies. She has a habit of eating orange popsicles and  cheese puffs, and she craves salty foods. She maintains a stable weight and has a decent appetite.  There is no family history of colon cancer. No brittle nails.  All other systems were reviewed with the patient and are negative.  MEDICAL HISTORY:  Past Medical History:  Diagnosis Date    Atrial fibrillation (HCC)    Cancer (HCC) skin   COVID    GERD (gastroesophageal reflux disease)    Hyperlipidemia    Hypertension    Stroke [facility])     SURGICAL HISTORY: Past Surgical History:  Procedure Laterality Date   CARDIOVERSION N/A 03/04/2016   Procedure: CARDIOVERSION;  Surgeon: Jacqueline Matsu, MD;  Location: [facility] ENDOSCOPY;  Service: Cardiovascular;  Laterality: N/A;   CATARACT EXTRACTION  2015   COLONOSCOPY  04/03/2008   COLONOSCOPY WITH PROPOFOL  N/A 03/09/2018   Procedure: COLONOSCOPY WITH PROPOFOL ;  Surgeon: Lanita Pitman, MD;  Location: [facility] ENDOSCOPY;  Service: Endoscopy;  Laterality: N/A;   ESOPHAGOGASTRODUODENOSCOPY (EGD) WITH PROPOFOL  N/A 03/08/2018   Procedure: ESOPHAGOGASTRODUODENOSCOPY (EGD) WITH PROPOFOL ;  Surgeon: Lanita Pitman, MD;  Location: [facility] ENDOSCOPY;  Service: Endoscopy;  Laterality: N/A;   GIVENS CAPSULE STUDY N/A 03/09/2018   Procedure: GIVENS CAPSULE STUDY;  Surgeon: Lanita Pitman, MD;  Location: [facility] ENDOSCOPY;  Service: Endoscopy;  Laterality: N/A;   TONSILLECTOMY  1953   TOTAL ABDOMINAL HYSTERECTOMY  1983    SOCIAL HISTORY: Social History   Socioeconomic History   Marital status: Widowed    Spouse name: Not on file   Number of children: 2   Years of education: Not on file   Highest education level: Not on file  Occupational History   Not on file  Tobacco Use   Smoking status: Never   Smokeless tobacco: Never  Vaping Use   Vaping status: Never Used  Substance and Sexual Activity   Alcohol use: No    Alcohol/week: 0.0 standard drinks of alcohol   Drug use: No   Sexual activity: Not on file  Other Topics Concern   Not on file  Social History Narrative   Lives alone.  Husband died Apr 03, 2016   Social Drivers of Corporate investment banker Strain: Not on file  Food Insecurity: Not on file  Transportation Needs: Not on file  Physical Activity: Not on file  Stress: Not on file  Social Connections: Not on file  Intimate Partner Violence: Not on  file    FAMILY HISTORY: Family History  Problem Relation Age of Onset   Hypertension Mother    Heart disease Father        CHF   Stroke Maternal Grandmother    Cancer Maternal Grandfather    Heart disease Brother 48   Leukemia Brother    Heart disease Brother 71   COPD Sister    Parkinson's disease Sister    Hypertension Child    Diabetes Child     ALLERGIES:  is allergic to aspirin, other, strawberry extract, codeine, enalapril, melatonin, penicillin g, shellfish allergy, shellfish-derived products, and latex.  MEDICATIONS:  Current Outpatient Medications  Medication Sig Dispense Refill   acetaminophen  (TYLENOL ) 500 MG tablet Take 500 mg by mouth 2 (two) times daily as needed for mild pain.      amiodarone  (PACERONE ) 200 MG tablet TAKE 1 TABLET DAILY 90 tablet 3   cetirizine (ZYRTEC) 10 MG tablet Take 10 mg by mouth daily.     Cholecalciferol 50 MCG (2000 UT) TABS Take 1 tablet by mouth daily.     diazepam  (VALIUM )  5 MG tablet Take 5 mg by mouth daily as needed for anxiety.     diltiazem  (CARDIZEM  CD) 120 MG 24 hr capsule TAKE 1 CAPSULE DAILY 90 capsule 3   estradiol (ESTRACE) 0.1 MG/GM vaginal cream Place 1 Applicatorful vaginally 2 (two) times a week. Wed and Sun     famotidine (PEPCID) 20 MG tablet Take by mouth.     hydrALAZINE  (APRESOLINE ) 10 MG tablet Take 1 tablet (10 mg total) by mouth 3 (three) times daily as needed (high blood pressure). 90 tablet 0   hydrALAZINE  (APRESOLINE ) 25 MG tablet Take 1 tablet (25 mg total) by mouth 3 (three) times daily. 270 tablet 3   levothyroxine (SYNTHROID) 25 MCG tablet Take 25 mcg by mouth daily before breakfast.     lidocaine  (LIDODERM ) 5 % APPLY 1 PATCH TOPICALLY TO THE SKIN EVERY DAY. MAY WEAR UP TO 12 HOURS     loperamide (IMODIUM A-D) 2 MG tablet Take by mouth.     losartan  (COZAAR ) 100 MG tablet TAKE 1 TABLET DAILY 90 tablet 3   Multiple Vitamins-Minerals (CENTRUM PO) Take 1 tablet by mouth daily. Not taking on Wed and Sun      nystatin (MYCOSTATIN/NYSTOP) powder Apply 1 Application topically as needed.     Probiotic Product (PROBIOTIC FORMULA) CAPS Take 1 capsule by mouth daily as needed (prevent infection).      rivaroxaban  (XARELTO ) 15 MG TABS tablet Take 1 tablet (15 mg total) by mouth daily with supper. 90 tablet 3   No current facility-administered medications for this visit.     PHYSICAL EXAMINATION: ECOG PERFORMANCE STATUS: 0 - Asymptomatic  Vitals:   03/16/24 1008  BP: (!) 161/46  Pulse: 89  Resp: 15  Temp: 97.7 F (36.5 C)  SpO2: 96%   Filed Weights   03/16/24 1008  Weight: 160 lb 6.4 oz (72.8 kg)    GENERAL:alert, no distress and comfortable, came in a wheel chair today SKIN: skin color, texture, turgor are normal, no rashes or significant lesions EYES: normal, conjunctiva are pink and non-injected, sclera clear OROPHARYNX:no exudate, no erythema and lips, buccal mucosa, and tongue normal  NECK: supple, thyroid  normal size, non-tender, without nodularity LYMPH:  no palpable lymphadenopathy in the cervical, axillary  LUNGS: clear to auscultation and percussion with normal breathing effort HEART: regular rate & rhythm and no murmurs and no lower extremity edema ABDOMEN:abdomen soft, non-tender and normal bowel sounds Musculoskeletal:no cyanosis of digits and no clubbing  PSYCH: alert & oriented x 3 with fluent speech NEURO: no focal motor/sensory deficits  LABORATORY DATA:  I have reviewed the data as listed Lab Results  Component Value Date   WBC 6.3 02/10/2024   HGB 10.0 (L) 02/10/2024   HCT 31.1 (L) 02/10/2024   MCV 79.2 02/10/2024   PLT 215.0 02/10/2024     Chemistry      Component Value Date/Time   NA 139 02/10/2024 1432   K 4.1 02/10/2024 1432   CL 103 02/10/2024 1432   CO2 27 02/10/2024 1432   BUN 13 02/10/2024 1432   CREATININE 0.97 02/10/2024 1432   CREATININE 0.96 (H) 02/13/2016 1609      Component Value Date/Time   CALCIUM  9.2 02/10/2024 1432   ALKPHOS 68  02/10/2024 1432   AST 23 02/10/2024 1432   ALT 14 02/10/2024 1432   BILITOT 0.5 02/10/2024 1432       RADIOGRAPHIC STUDIES: I have personally reviewed the radiological images as listed and agreed with the findings in the  report. No results found.  All questions were answered. The patient knows to call the clinic with any problems, questions or concerns. I spent 45 minutes in the care of this patient including H and P, review of records, counseling and coordination of care.     Murleen Arms, MD 03/16/2024 10:42 AM

## 2024-03-16 NOTE — Telephone Encounter (Signed)
-----   Message from Moroni Iruku sent at 03/16/2024  2:50 PM EDT ----- I tried calling her, Hb did drop since she quit the iron. If she wants to go back on the iron pill and come back and see us  in 8 weeks, this is reasonable. She will also qualify for IV iron. Can you talk to her and let me know.

## 2024-03-16 NOTE — Telephone Encounter (Signed)
 Per Dr.Iruku, called pt with message below. Pt scheduled to see Dr. Arno Bibles to f/u and discuss labs and potential iron infusions. Pt verbalized understanding

## 2024-03-19 ENCOUNTER — Other Ambulatory Visit: Payer: Self-pay | Admitting: Hematology and Oncology

## 2024-03-19 ENCOUNTER — Telehealth: Payer: Self-pay | Admitting: Hematology and Oncology

## 2024-03-19 NOTE — Telephone Encounter (Signed)
 Confirmed with pt scheduled appt date and time

## 2024-03-28 ENCOUNTER — Other Ambulatory Visit: Payer: Self-pay | Admitting: Hematology and Oncology

## 2024-03-28 ENCOUNTER — Telehealth: Payer: Self-pay | Admitting: *Deleted

## 2024-03-28 NOTE — Progress Notes (Unsigned)
 Venofer orders placed 200 mg times 5. Inbasket message sent to scheduling.  Durga Saldarriaga

## 2024-03-28 NOTE — Telephone Encounter (Signed)
 This RN spoke with the patient's daughter- Adell Hones - who states since starting the iron supplement her mom is having pretty significant diarrhea.  The diarrhea is causing increased weakness as well as interference with her ADL's.  This RN informed Adell Hones IV iron is appropriate.  Pt should stop the oral iron at present due to diarrhea.  This RN will forward this note to the MD for entering of orders for iron to be scheduled.

## 2024-03-30 ENCOUNTER — Telehealth: Payer: Self-pay

## 2024-03-30 NOTE — Telephone Encounter (Signed)
 Pt's daughter called to find out about iron infusions as she has not heard from schedulers. Glenora Laos I sent a follow up message to schedulers.

## 2024-04-09 ENCOUNTER — Inpatient Hospital Stay: Attending: Hematology and Oncology

## 2024-04-09 VITALS — BP 169/55 | HR 70 | Temp 97.8°F | Resp 16

## 2024-04-09 DIAGNOSIS — Z79899 Other long term (current) drug therapy: Secondary | ICD-10-CM | POA: Diagnosis not present

## 2024-04-09 DIAGNOSIS — D509 Iron deficiency anemia, unspecified: Secondary | ICD-10-CM | POA: Insufficient documentation

## 2024-04-09 DIAGNOSIS — D62 Acute posthemorrhagic anemia: Secondary | ICD-10-CM

## 2024-04-09 MED ORDER — IRON SUCROSE 20 MG/ML IV SOLN
200.0000 mg | Freq: Once | INTRAVENOUS | Status: AC
  Administered 2024-04-09: 200 mg via INTRAVENOUS
  Filled 2024-04-09: qty 10

## 2024-04-09 MED ORDER — SODIUM CHLORIDE 0.9 % IV SOLN: INTRAVENOUS | Status: DC

## 2024-04-09 MED ORDER — ACETAMINOPHEN 325 MG PO TABS
650.0000 mg | ORAL_TABLET | Freq: Once | ORAL | Status: AC
Start: 2024-04-09 — End: 2024-04-09
  Administered 2024-04-09: 650 mg via ORAL
  Filled 2024-04-09: qty 2

## 2024-04-09 NOTE — Patient Instructions (Signed)
 Iron Dextran Injection What is this medication? IRON DEXTRAN (EYE ern DEX tran) treats low levels of iron in your body. Iron is a mineral that plays an important role in making red blood cells, which carry oxygen from your lungs to the rest of your body. This medicine may be used for other purposes; ask your health care provider or pharmacist if you have questions. COMMON BRAND NAME(S): Dexferrum, INFeD What should I tell my care team before I take this medication? They need to know if you have any of these conditions: Anemia not caused by low iron levels Heart disease High levels of iron in the blood Kidney disease Liver disease An unusual or allergic reaction to iron, other medications, foods, dyes, or preservatives Pregnant or trying to get pregnant Breastfeeding How should I use this medication? This medication is injected into a vein or a muscle. It is given by your care team in a hospital or clinic setting. Talk to your care team about the use of this medication in children. While it may be prescribed for children as young as 4 months for selected conditions, precautions do apply. Overdosage: If you think you have taken too much of this medicine contact a poison control center or emergency room at once. NOTE: This medicine is only for you. Do not share this medicine with others. What if I miss a dose? Keep appointments for follow-up doses. It is important not to miss your dose. Call your care team if you are unable to keep an appointment. What may interact with this medication? Do not take this medication with any of the following: Deferoxamine Dimercaprol Other iron products This medication may also interact with the following: Chloramphenicol Deferasirox This list may not describe all possible interactions. Give your health care provider a list of all the medicines, herbs, non-prescription drugs, or dietary supplements you use. Also tell them if you smoke, drink alcohol, or use  illegal drugs. Some items may interact with your medicine. What should I watch for while using this medication? Visit your care team for regular checks on your progress. Tell your care team if your symptoms do not start to get better or if they get worse. You may need blood work while taking this medication. You may need to eat more foods that contain iron. Talk to your care team. Foods that contain iron include whole grains/cereals, dried fruits, beans, peas, leafy green vegetables, and organ meats (liver, kidney). Long-term use of this medication may increase your risk of some cancers. Talk to your care team about your risk of cancer. What side effects may I notice from receiving this medication? Side effects that you should report to your care team as soon as possible: Allergic reactions--skin rash, itching, hives, swelling of the face, lips, tongue, or throat Low blood pressure--dizziness, feeling faint or lightheaded, blurry vision Shortness of breath Side effects that usually do not require medical attention (report to your care team if they continue or are bothersome): Flushing Headache Joint pain Muscle pain Nausea Pain, redness, or irritation at injection site This list may not describe all possible side effects. Call your doctor for medical advice about side effects. You may report side effects to FDA at 1-800-FDA-1088. Where should I keep my medication? This medication is given in a hospital or clinic. It will not be stored at home. NOTE: This sheet is a summary. It may not cover all possible information. If you have questions about this medicine, talk to your doctor, pharmacist, or health  care provider.  2024 Elsevier/Gold Standard (2023-07-06 00:00:00)

## 2024-04-16 ENCOUNTER — Inpatient Hospital Stay

## 2024-04-16 VITALS — BP 163/54 | HR 71 | Temp 97.7°F | Resp 16

## 2024-04-16 DIAGNOSIS — D62 Acute posthemorrhagic anemia: Secondary | ICD-10-CM

## 2024-04-16 DIAGNOSIS — D509 Iron deficiency anemia, unspecified: Secondary | ICD-10-CM | POA: Diagnosis not present

## 2024-04-16 MED ORDER — ACETAMINOPHEN 325 MG PO TABS
650.0000 mg | ORAL_TABLET | Freq: Once | ORAL | Status: AC
  Administered 2024-04-16: 650 mg via ORAL
  Filled 2024-04-16: qty 2

## 2024-04-16 MED ORDER — IRON SUCROSE 20 MG/ML IV SOLN
200.0000 mg | Freq: Once | INTRAVENOUS | Status: AC
  Administered 2024-04-16: 200 mg via INTRAVENOUS
  Filled 2024-04-16: qty 10

## 2024-04-16 MED ORDER — SODIUM CHLORIDE 0.9 % IV SOLN: INTRAVENOUS | Status: DC

## 2024-04-16 NOTE — Progress Notes (Signed)
 Patient tolerated her iron  today- mild HA, but her blood pressure is high. No other significant symptoms of any issues- tolerated her last infusion with no issues. VSS-  BP (!) 163/54 (BP Location: Left Arm, Patient Position: Sitting)   Pulse 71   Temp 97.7 F (36.5 C) (Oral)   Resp 16   SpO2 97%   WC with asst. to the lobby with family.

## 2024-04-16 NOTE — Patient Instructions (Signed)

## 2024-04-24 ENCOUNTER — Inpatient Hospital Stay

## 2024-04-24 ENCOUNTER — Telehealth: Payer: Self-pay | Admitting: Hematology and Oncology

## 2024-05-01 ENCOUNTER — Inpatient Hospital Stay (HOSPITAL_BASED_OUTPATIENT_CLINIC_OR_DEPARTMENT_OTHER): Admitting: Hematology and Oncology

## 2024-05-01 ENCOUNTER — Inpatient Hospital Stay: Attending: Hematology and Oncology

## 2024-05-01 VITALS — BP 158/55 | HR 67 | Temp 98.0°F | Resp 16 | Ht 67.0 in | Wt 154.1 lb

## 2024-05-01 DIAGNOSIS — D509 Iron deficiency anemia, unspecified: Secondary | ICD-10-CM | POA: Insufficient documentation

## 2024-05-01 DIAGNOSIS — D62 Acute posthemorrhagic anemia: Secondary | ICD-10-CM

## 2024-05-01 MED ORDER — IRON SUCROSE 20 MG/ML IV SOLN
200.0000 mg | Freq: Once | INTRAVENOUS | Status: AC
  Administered 2024-05-01: 200 mg via INTRAVENOUS
  Filled 2024-05-01: qty 10

## 2024-05-01 MED ORDER — SODIUM CHLORIDE 0.9 % IV SOLN: INTRAVENOUS | Status: DC

## 2024-05-01 MED ORDER — ACETAMINOPHEN 325 MG PO TABS
650.0000 mg | ORAL_TABLET | Freq: Once | ORAL | Status: AC
  Administered 2024-05-01: 650 mg via ORAL
  Filled 2024-05-01: qty 2

## 2024-05-01 NOTE — Progress Notes (Signed)
 [facility] CONSULT NOTE  Patient Care Team: Harolyn Likes, MD as PCP - General (Internal Medicine) Eilleen Grates, MD as PCP - Cardiology (Cardiology)  CHIEF COMPLAINTS/PURPOSE OF CONSULTATION:  IDA  ASSESSMENT & PLAN:  IDA (iron  deficiency anemia) Assessment and Plan Assessment & Plan Iron  Deficiency Anemia Chronic iron  deficiency anemia with moderate anemia. Intolerance to high-dose oral iron . Possible hemorrhoidal bleeding.  -Receiving IV venofer . She is tolerating it well, didn't notice much improvement. - Continue venofer  as prescribed. - Continue vitamin B12 supplementation due to low B12 levels. - She is otherwise doing well with no concerns of bleeding per rectum  - Repeat labs and FU in 1 st week of July with APP.    No orders of the defined types were placed in this encounter.    HISTORY OF PRESENTING ILLNESS:  Sarah Armstrong 89 y.o. female is here because of IDA  Discussed the use of AI scribe software for clinical note transcription with the patient, who gave verbal consent to proceed.  History of Present Illness Sarah Armstrong is a 89 year old female with iron  deficiency anemia who presents for evaluation of her anemia and consideration of IV iron  therapy.  History of Present Illness  She is here for a follow up, saw her in infusion. She received 2 doses of venofer  so far, says she didn't notice much difference yet, may be mild improvement in fatigue. No other complaints. No worsening SOB. NO hemorrhoidal bleeding. No other complaints.  All other systems were reviewed with the patient and are negative.  MEDICAL HISTORY:  Past Medical History:  Diagnosis Date   Atrial fibrillation (HCC)    Cancer (HCC) skin   COVID    GERD (gastroesophageal reflux disease)    Hyperlipidemia    Hypertension    Stroke [facility])     SURGICAL HISTORY: Past Surgical History:  Procedure Laterality Date   CARDIOVERSION N/A 03/04/2016   Procedure: CARDIOVERSION;   Surgeon: Jacqueline Matsu, MD;  Location: [facility] END[facility];  Service: Cardiovascular;  Laterality: N/A;   CATARACT EXTRACTION  2015   COLONOSCOPY  06-03-08   COLONOSCOPY WITH PROPOFOL  N/A 03/09/2018   Procedure: COLONOSCOPY WITH PROPOFOL ;  Surgeon: Lanita Pitman, MD;  Location: [facility] ENDOSCOPY;  Service: Endoscopy;  Laterality: N/A;   ESOPHAGOGASTRODUODENOSCOPY (EGD) WITH PROPOFOL  N/A 03/08/2018   Procedure: ESOPHAGOGASTRODUODENOSCOPY (EGD) WITH PROPOFOL ;  Surgeon: Lanita Pitman, MD;  Location: [facility] ENDOSCOPY;  Service: Endoscopy;  Laterality: N/A;   GIVENS CAPSULE STUDY N/A 03/09/2018   Procedure: GIVENS CAPSULE STUDY;  Surgeon: Lanita Pitman, MD;  Location: [facility] ENDOS[facility];  Service: Endoscopy;  Laterality: N/A;   TONSILLECTOMY  1953   TOTAL ABDOMINAL HYSTERECTOMY  1983    SOCIAL HISTORY: Social History   Socioeconomic History   Marital status: Widowed    Spouse name: Not on file   Number of children: 2   Years of education: Not on file   Highest education level: Not on file  Occupational History   Not on file  Tobacco Use   Smoking status: Never   Smokeless tobacco: Never  Vaping Use   Vaping status: Never Used  Substance and Sexual Activity   Alcohol use: No    Alcohol/week: 0.0 standard drinks of alcohol   Drug use: No   Sexual activity: Not on file  Other Topics Concern   Not on file  Social History Narrative   Lives alone.  Husband died 2016/06/03   Social Drivers of Health   Financial Resource Strain: Not on  file  Food Insecurity: Not on file  Transportation Needs: Not on file  Physical Activity: Not on file  Stress: Not on file  Social Connections: Not on file  Intimate Partner Violence: Not on file    FAMILY HISTORY: Family History  Problem Relation Age of Onset   Hypertension Mother    Heart disease Father        CHF   Stroke Maternal Grandmother    Cancer Maternal Grandfather    Heart disease Brother 55   Leukemia Brother    Heart disease Brother 24   COPD Sister     Parkinson's disease Sister    Hypertension Child    Diabetes Child     ALLERGIES:  is allergic to aspirin, other, strawberry extract, codeine, enalapril, melatonin, penicillin g, shellfish allergy, shellfish-derived products, and latex.  MEDICATIONS:  Current Outpatient Medications  Medication Sig Dispense Refill   acetaminophen  (TYLENOL ) 500 MG tablet Take 500 mg by mouth 2 (two) times daily as needed for mild pain.      amiodarone  (PACERONE ) 200 MG tablet TAKE 1 TABLET DAILY 90 tablet 3   cetirizine (ZYRTEC) 10 MG tablet Take 10 mg by mouth daily.     Cholecalciferol 50 MCG (2000 UT) TABS Take 1 tablet by mouth daily.     diazepam  (VALIUM ) 5 MG tablet Take 5 mg by mouth daily as needed for anxiety.     diltiazem  (CARDIZEM  CD) 120 MG 24 hr capsule TAKE 1 CAPSULE DAILY 90 capsule 3   estradiol (ESTRACE) 0.1 MG/GM vaginal cream Place 1 Applicatorful vaginally 2 (two) times a week. Wed and Sun     famotidine (PEPCID) 20 MG tablet Take by mouth.     hydrALAZINE  (APRESOLINE ) 10 MG tablet Take 1 tablet (10 mg total) by mouth 3 (three) times daily as needed (high blood pressure). 90 tablet 0   hydrALAZINE  (APRESOLINE ) 25 MG tablet Take 1 tablet (25 mg total) by mouth 3 (three) times daily. 270 tablet 3   levothyroxine (SYNTHROID) 25 MCG tablet Take 25 mcg by mouth daily before breakfast.     lidocaine  (LIDODERM ) 5 % APPLY 1 PATCH TOPICALLY TO THE SKIN EVERY DAY. MAY WEAR UP TO 12 HOURS     loperamide (IMODIUM A-D) 2 MG tablet Take by mouth.     losartan  (COZAAR ) 100 MG tablet TAKE 1 TABLET DAILY 90 tablet 3   Multiple Vitamins-Minerals (CENTRUM PO) Take 1 tablet by mouth daily. Not taking on Wed and Sun     nystatin (MYCOSTATIN/NYSTOP) powder Apply 1 Application topically as needed.     Probiotic Product (PROBIOTIC FORMULA) CAPS Take 1 capsule by mouth daily as needed (prevent infection).      rivaroxaban  (XARELTO ) 15 MG TABS tablet Take 1 tablet (15 mg total) by mouth daily with supper. 90  tablet 3   No current facility-administered medications for this visit.   Facility-Administered Medications Ordered in Other Visits  Medication Dose Route Frequency Provider Last Rate Last Admin   0.9 %  sodium chloride  infusion   Intravenous Continuous Camdon Saetern, MD   Stopped at 05/01/24 1528   PHYSICAL EXAMINATION: ECOG PERFORMANCE STATUS: 0 - Asymptomatic  There were no vitals taken for this visit.  GENERAL:alert, no distress and comfortable, came in a wheel chair today LYMPH:  no palpable lymphadenopathy in the cervical, axillary  LUNGS: clear to auscultation and percussion with normal breathing effort HEART: regular rate & rhythm and no murmurs and no lower extremity edema ABDOMEN:abdomen soft, non-tender and  normal bowel sounds Musculoskeletal:no cyanosis of digits and no clubbing  PSYCH: alert & oriented x 3 with fluent speech NEURO: no focal motor/sensory deficits  LABORATORY DATA:  I have reviewed the data as listed Lab Results  Component Value Date   WBC 6.6 03/16/2024   HGB 9.0 (L) 03/16/2024   HCT 29.4 (L) 03/16/2024   MCV 75.6 (L) 03/16/2024   PLT 235 03/16/2024     Chemistry      Component Value Date/Time   NA 139 02/10/2024 1432   K 4.1 02/10/2024 1432   CL 103 02/10/2024 1432   CO2 27 02/10/2024 1432   BUN 13 02/10/2024 1432   CREATININE 0.97 02/10/2024 1432   CREATININE 0.96 (H) 02/13/2016 1609      Component Value Date/Time   CALCIUM  9.2 02/10/2024 1432   ALKPHOS 68 02/10/2024 1432   AST 23 02/10/2024 1432   ALT 14 02/10/2024 1432   BILITOT 0.5 02/10/2024 1432       RADIOGRAPHIC STUDIES: I have personally reviewed the radiological images as listed and agreed with the findings in the report. No results found.  All questions were answered. The patient knows to call the clinic with any problems, questions or concerns. I spent 30 minutes in the care of this patient including H and P, review of records, counseling and coordination of  care.     Murleen Arms, MD 05/01/2024 4:08 PM

## 2024-05-01 NOTE — Assessment & Plan Note (Signed)
 Assessment and Plan Assessment & Plan Iron  Deficiency Anemia Chronic iron  deficiency anemia with moderate anemia. Intolerance to high-dose oral iron . Possible hemorrhoidal bleeding.  -Receiving IV venofer . She is tolerating it well, didn't notice much improvement. - Continue venofer  as prescribed. - Continue vitamin B12 supplementation due to low B12 levels. - She is otherwise doing well with no concerns of bleeding per rectum  - Repeat labs and FU in 1 st week of July with APP.

## 2024-05-01 NOTE — Patient Instructions (Signed)

## 2024-05-02 ENCOUNTER — Ambulatory Visit: Admitting: Hematology and Oncology

## 2024-05-08 ENCOUNTER — Inpatient Hospital Stay

## 2024-05-08 VITALS — BP 156/53 | HR 71 | Temp 98.8°F | Resp 19

## 2024-05-08 DIAGNOSIS — D509 Iron deficiency anemia, unspecified: Secondary | ICD-10-CM | POA: Diagnosis not present

## 2024-05-08 DIAGNOSIS — D62 Acute posthemorrhagic anemia: Secondary | ICD-10-CM

## 2024-05-08 MED ORDER — IRON SUCROSE 20 MG/ML IV SOLN
200.0000 mg | Freq: Once | INTRAVENOUS | Status: AC
  Administered 2024-05-08: 200 mg via INTRAVENOUS
  Filled 2024-05-08: qty 10

## 2024-05-08 MED ORDER — ACETAMINOPHEN 325 MG PO TABS
650.0000 mg | ORAL_TABLET | Freq: Once | ORAL | Status: AC
  Administered 2024-05-08: 650 mg via ORAL
  Filled 2024-05-08: qty 2

## 2024-05-08 MED ORDER — SODIUM CHLORIDE 0.9 % IV SOLN: INTRAVENOUS | Status: DC

## 2024-05-08 NOTE — Progress Notes (Signed)
 Patient observed for 30 min following iron  infusion.  Tolerated treatment well without incident.  VSS at discharge.  Wheelchair to lobby.

## 2024-05-08 NOTE — Patient Instructions (Signed)

## 2024-05-15 ENCOUNTER — Inpatient Hospital Stay

## 2024-05-15 VITALS — BP 144/47 | HR 67 | Temp 98.6°F | Resp 18

## 2024-05-15 DIAGNOSIS — D509 Iron deficiency anemia, unspecified: Secondary | ICD-10-CM | POA: Diagnosis not present

## 2024-05-15 DIAGNOSIS — D62 Acute posthemorrhagic anemia: Secondary | ICD-10-CM

## 2024-05-15 MED ORDER — ACETAMINOPHEN 325 MG PO TABS
650.0000 mg | ORAL_TABLET | Freq: Once | ORAL | Status: AC
  Administered 2024-05-15: 650 mg via ORAL
  Filled 2024-05-15: qty 2

## 2024-05-15 MED ORDER — IRON SUCROSE 20 MG/ML IV SOLN
200.0000 mg | Freq: Once | INTRAVENOUS | Status: AC
  Administered 2024-05-15: 200 mg via INTRAVENOUS
  Filled 2024-05-15: qty 10

## 2024-05-15 MED ORDER — SODIUM CHLORIDE 0.9 % IV SOLN: INTRAVENOUS | Status: DC

## 2024-05-15 NOTE — Progress Notes (Signed)
 Pt observed for 25 minutes post Venofer infusion. Pt tolerated Tx well w/out incident. VSS at discharge.  Ambulatory to lobby.

## 2024-05-15 NOTE — Patient Instructions (Signed)

## 2024-05-28 ENCOUNTER — Inpatient Hospital Stay (HOSPITAL_BASED_OUTPATIENT_CLINIC_OR_DEPARTMENT_OTHER): Admitting: Adult Health

## 2024-05-28 ENCOUNTER — Inpatient Hospital Stay

## 2024-05-28 ENCOUNTER — Encounter: Payer: Self-pay | Admitting: Adult Health

## 2024-05-28 VITALS — BP 162/57 | HR 70 | Temp 97.9°F | Resp 18 | Ht 67.0 in | Wt 155.4 lb

## 2024-05-28 DIAGNOSIS — D500 Iron deficiency anemia secondary to blood loss (chronic): Secondary | ICD-10-CM | POA: Diagnosis not present

## 2024-05-28 DIAGNOSIS — D509 Iron deficiency anemia, unspecified: Secondary | ICD-10-CM

## 2024-05-28 LAB — CBC WITH DIFFERENTIAL/PLATELET
Abs Immature Granulocytes: 0.01 10*3/uL (ref 0.00–0.07)
Basophils Absolute: 0 10*3/uL (ref 0.0–0.1)
Basophils Relative: 1 %
Eosinophils Absolute: 0.2 10*3/uL (ref 0.0–0.5)
Eosinophils Relative: 3 %
HCT: 38.8 % (ref 36.0–46.0)
Hemoglobin: 12.3 g/dL (ref 12.0–15.0)
Immature Granulocytes: 0 %
Lymphocytes Relative: 14 %
Lymphs Abs: 0.9 10*3/uL (ref 0.7–4.0)
MCH: 27.8 pg (ref 26.0–34.0)
MCHC: 31.7 g/dL (ref 30.0–36.0)
MCV: 87.8 fL (ref 80.0–100.0)
Monocytes Absolute: 0.9 10*3/uL (ref 0.1–1.0)
Monocytes Relative: 13 %
Neutro Abs: 4.6 10*3/uL (ref 1.7–7.7)
Neutrophils Relative %: 69 %
Platelets: 187 10*3/uL (ref 150–400)
RBC: 4.42 MIL/uL (ref 3.87–5.11)
RDW: 26 % — ABNORMAL HIGH (ref 11.5–15.5)
WBC: 6.5 10*3/uL (ref 4.0–10.5)
nRBC: 0 % (ref 0.0–0.2)

## 2024-05-28 NOTE — Progress Notes (Signed)
 [facility] Cancer Follow up:    Sarah Handing, MD [address] [place] [address]   DIAGNOSIS: Iron  deficiency Anemia   SUMMARY OF HEMATOLOGIC HISTORY: Evaluation by Dr. Loretha 03/16/2024 for iron -deficiency anemia: Ferritin 4, iron  16, saturation 3, TIBC 568, B12 410, hemoglobin 9 IV Venofer  200mg  weekly x 5 beginning 04/09/2024 Hemoglobin on 05/28/2024 12.3  CURRENT THERAPY: intermittent IV iron   INTERVAL HISTORY:  Discussed the use of AI scribe software for clinical note transcription with the patient, who gave verbal consent to proceed.  History of Present Illness Sarah Armstrong is a 89 year old female with anemia who presents for follow-up after completing IV iron  therapy.  She completed a five-week course of IV iron  therapy with Venofer , but her energy level remains unchanged, and she continues to experience persistent tiredness. No rash or itching occurred during the iron  therapy.  She tolerated the Venofer  well.   She experienced diarrhea and rectal bleeding for a day and night, which has since improved and was attributed to internal hemorrhoids.  She is on levothyroxine with a recent dosage adjustment to 37.5 mg. She takes vitamin D 2000 IU daily and B12 supplements 1000 mcg daily. Stiffness in her feet was suggested to be early neuropathy.  No swollen lymph nodes, night sweats, or unintentional weight loss.   Patient Active Problem List   Diagnosis Date Noted   IDA (iron  deficiency anemia) 03/16/2024   Bruit 09/05/2022   SOB (shortness of breath) 09/16/2020   AKI (acute kidney injury) (HCC) 03/08/2018   Acute blood loss anemia 03/08/2018   GI bleed 03/07/2018   Dyslipidemia 02/24/2018   Bradycardia 02/24/2018   Bilateral carotid artery stenosis 02/24/2018   Essential hypertension 10/07/2017   Hypotension 10/07/2017   Non-rheumatic mitral regurgitation 10/07/2017   Persistent atrial fibrillation (HCC)    Atrial fibrillation (HCC) 02/13/2016     is allergic to aspirin, other, strawberry extract, codeine, enalapril, melatonin, penicillin g, shellfish allergy, shellfish-derived products, and latex.  MEDICAL HISTORY: Past Medical History:  Diagnosis Date   Atrial fibrillation (HCC)    Cancer (HCC) skin   COVID    GERD (gastroesophageal reflux disease)    Hyperlipidemia    Hypertension    Stroke [facility])     SURGICAL HISTORY: Past Surgical History:  Procedure Laterality Date   CARDIOVERSION N/A 03/04/2016   Procedure: CARDIOVERSION;  Surgeon: Wilbert JONELLE Bihari, MD;  Location: [facility];  Service: Cardiovascular;  Laterality: N/A;   CATARACT EXTRACTION  2015   COLONOSCOPY  2009   COLONOSCOPY WITH PROPOFOL  N/A 03/09/2018   Procedure: COLONOSCOPY WITH PROPOFOL ;  Surgeon: Donnald Charleston, MD;  Location: [facility];  Service: Endoscopy;  Laterality: N/A;   ESOPHAGOGASTRODUODENOSCOPY (EGD) WITH PROPOFOL  N/A 03/08/2018   Procedure: ESOPHAGOGASTRODUODENOSCOPY (EGD) WITH PROPOFOL ;  Surgeon: Donnald Charleston, MD;  Location: [facility];  Service: Endoscopy;  Laterality: N/A;   GIVENS CAPSULE STUDY N/A 03/09/2018   Procedure: GIVENS CAPSULE STUDY;  Surgeon: Donnald Charleston, MD;  Location: [facility];  Service: Endoscopy;  Laterality: N/A;   TONSILLECTOMY  1953   TOTAL ABDOMINAL HYSTERECTOMY  1983    SOCIAL HISTORY: Social History   Socioeconomic History   Marital status: Widowed    Spouse name: Not on file   Number of children: 2   Years of education: Not on file   Highest education level: Not on file  Occupational History   Not on file  Tobacco Use   Smoking status: Never   Smokeless tobacco: Never  Vaping  Use   Vaping status: Never Used  Substance and Sexual Activity   Alcohol use: No    Alcohol/week: 0.0 standard drinks of alcohol   Drug use: No   Sexual activity: Not on file  Other Topics Concern   Not on file  Social History Narrative   Lives alone.  Husband died 2016/07/02   Social Drivers of Research scientist (physical sciences) Strain: Not on file  Food Insecurity: Not on file  Transportation Needs: Not on file  Physical Activity: Not on file  Stress: Not on file  Social Connections: Not on file  Intimate Partner Violence: Not on file    FAMILY HISTORY: Family History  Problem Relation Age of Onset   Hypertension Mother    Heart disease Father        CHF   Stroke Maternal Grandmother    Cancer Maternal Grandfather    Heart disease Brother 53   Leukemia Brother    Heart disease Brother 74   COPD Sister    Parkinson's disease Sister    Hypertension Child    Diabetes Child     Review of Systems  Constitutional:  Positive for fatigue. Negative for appetite change, chills, fever and unexpected weight change.  HENT:   Negative for hearing loss, lump/mass and trouble swallowing.   Eyes:  Negative for eye problems and icterus.  Respiratory:  Negative for chest tightness, cough and shortness of breath.   Cardiovascular:  Negative for chest pain, leg swelling and palpitations.  Gastrointestinal:  Negative for abdominal distention, abdominal pain, constipation, diarrhea, nausea and vomiting.  Endocrine: Negative for hot flashes.  Genitourinary:  Negative for difficulty urinating.   Musculoskeletal:  Positive for arthralgias.  Skin:  Negative for itching and rash.  Neurological:  Negative for dizziness, extremity weakness, headaches and numbness.  Hematological:  Negative for adenopathy. Does not bruise/bleed easily.  Psychiatric/Behavioral:  Negative for depression. The patient is not nervous/anxious.       PHYSICAL EXAMINATION   Onc Performance Status - 05/28/24 1336       ECOG Perf Status   ECOG Perf Status Ambulatory and capable of all selfcare but unable to carry out any work activities.  Up and about more than 50% of waking hours      KPS SCALE   KPS % SCORE Cares for self, unable to carry on normal activity or to do active work          Vitals:   05/28/24 1330  BP: (!) 162/57   Pulse: 70  Resp: 18  Temp: 97.9 F (36.6 C)  SpO2: 94%    Physical Exam Constitutional:      General: She is not in acute distress.    Appearance: Normal appearance. She is not ill-appearing or toxic-appearing.     Comments: Examined in wheelchair  HENT:     Head: Normocephalic and atraumatic.     Mouth/Throat:     Mouth: Mucous membranes are moist.     Pharynx: Oropharynx is clear. No oropharyngeal exudate or posterior oropharyngeal erythema.   Eyes:     General: No scleral icterus.   Cardiovascular:     Rate and Rhythm: Normal rate and regular rhythm.     Pulses: Normal pulses.     Heart sounds: Normal heart sounds.  Pulmonary:     Effort: Pulmonary effort is normal.     Breath sounds: Normal breath sounds.  Abdominal:     General: Abdomen is flat. Bowel sounds are normal.  There is no distension.     Palpations: Abdomen is soft.     Tenderness: There is no abdominal tenderness.   Musculoskeletal:        General: No swelling.     Cervical back: Neck supple.  Lymphadenopathy:     Cervical: No cervical adenopathy.   Skin:    General: Skin is warm and dry.     Findings: No rash.   Neurological:     General: No focal deficit present.     Mental Status: She is alert.   Psychiatric:        Mood and Affect: Mood normal.        Behavior: Behavior normal.     LABORATORY DATA:  CBC    Component Value Date/Time   WBC 6.5 05/28/2024 1244   RBC 4.42 05/28/2024 1244   HGB 12.3 05/28/2024 1244   HCT 38.8 05/28/2024 1244   HCT 30.2 (L) 03/08/2018 1530   PLT 187 05/28/2024 1244   MCV 87.8 05/28/2024 1244   MCH 27.8 05/28/2024 1244   MCHC 31.7 05/28/2024 1244   RDW 26.0 (H) 05/28/2024 1244   LYMPHSABS 0.9 05/28/2024 1244   MONOABS 0.9 05/28/2024 1244   EOSABS 0.2 05/28/2024 1244   BASOSABS 0.0 05/28/2024 1244    CMP     Component Value Date/Time   NA 139 02/10/2024 1432   K 4.1 02/10/2024 1432   CL 103 02/10/2024 1432   CO2 27 02/10/2024 1432    GLUCOSE 127 (H) 02/10/2024 1432   BUN 13 02/10/2024 1432   CREATININE 0.97 02/10/2024 1432   CREATININE 0.96 (H) 02/13/2016 1609   CALCIUM  9.2 02/10/2024 1432   PROT 6.9 02/10/2024 1432   ALBUMIN 3.9 02/10/2024 1432   AST 23 02/10/2024 1432   ALT 14 02/10/2024 1432   ALKPHOS 68 02/10/2024 1432   BILITOT 0.5 02/10/2024 1432   GFRNONAA 52 (L) 12/24/2023 1733   GFRAA 47 (L) 03/07/2018 1410     ASSESSMENT and THERAPY PLAN:   Assessment and Plan Assessment & Plan Iron  deficiency anemia Improved hemoglobin from 9 to 12.3 g/dL after IV iron  therapy. Reports feeling weaker today, but hemoglobin improvement is positive. - Recheck hemoglobin and iron  levels in a few weeks. - Schedule follow-up appointment in 3-3.5 weeks.  Peripheral neuropathy Possible peripheral neuropathy indicated by foot stiffness and tingling. Taking oral B12 supplements, which may help if related to B12 deficiency. - Check B12 levels during next lab work. - Consider increasing B12 dosage if levels are low.  Hypothyroidism Recent levothyroxine dosage adjustment to 37.5 mcg. Thyroid  dysfunction may contribute to fatigue. - Continue current levothyroxine dosage. - Monitor energy levels and thyroid  function. - Continue f/u with PCP about thyroid  levels and management  Vitamin D deficiency Managed with daily supplementation of 2000 IU.  - Continue daily vitamin D supplementation of 2000 IU. - Continue f/u with PCP to monitor vitamin D levels   The above was reviewed with Dr. Loretha who is in agreement with lab testing and f/u.    All questions were answered. The patient knows to call the clinic with any problems, questions or concerns. We can certainly see the patient much sooner if necessary.  Total encounter time:30 minutes*in face-to-face visit time, chart review, lab review, care coordination, order entry, and documentation of the encounter time.    Sarah Kendall, NP 05/28/24 1:45 PM Medical Oncology  and Hematology [facility] [address] [place], [place] [address] Tel. [phone]  Fax. [phone]  *Total Encounter Time as defined by the Centers for Medicare and Medicaid Services includes, in addition to the face-to-face time of a patient visit (documented in the note above) non-face-to-face time: obtaining and reviewing outside history, ordering and reviewing medications, tests or procedures, care coordination (communications with other health care professionals or caregivers) and documentation in the medical record.

## 2024-06-05 ENCOUNTER — Ambulatory Visit: Admitting: Adult Health

## 2024-06-05 ENCOUNTER — Other Ambulatory Visit

## 2024-06-21 ENCOUNTER — Inpatient Hospital Stay

## 2024-06-22 ENCOUNTER — Inpatient Hospital Stay: Attending: Hematology and Oncology

## 2024-06-22 DIAGNOSIS — D509 Iron deficiency anemia, unspecified: Secondary | ICD-10-CM | POA: Insufficient documentation

## 2024-06-22 DIAGNOSIS — D500 Iron deficiency anemia secondary to blood loss (chronic): Secondary | ICD-10-CM

## 2024-06-22 LAB — CBC WITH DIFFERENTIAL (CANCER CENTER ONLY)
Abs Immature Granulocytes: 0.03 K/uL (ref 0.00–0.07)
Basophils Absolute: 0.1 K/uL (ref 0.0–0.1)
Basophils Relative: 1 %
Eosinophils Absolute: 0.1 K/uL (ref 0.0–0.5)
Eosinophils Relative: 2 %
HCT: 39.9 % (ref 36.0–46.0)
Hemoglobin: 13.2 g/dL (ref 12.0–15.0)
Immature Granulocytes: 0 %
Lymphocytes Relative: 15 %
Lymphs Abs: 1.1 K/uL (ref 0.7–4.0)
MCH: 29.8 pg (ref 26.0–34.0)
MCHC: 33.1 g/dL (ref 30.0–36.0)
MCV: 90.1 fL (ref 80.0–100.0)
Monocytes Absolute: 0.9 K/uL (ref 0.1–1.0)
Monocytes Relative: 12 %
Neutro Abs: 5.3 K/uL (ref 1.7–7.7)
Neutrophils Relative %: 70 %
Platelet Count: 177 K/uL (ref 150–400)
RBC: 4.43 MIL/uL (ref 3.87–5.11)
RDW: 21.4 % — ABNORMAL HIGH (ref 11.5–15.5)
WBC Count: 7.5 K/uL (ref 4.0–10.5)
nRBC: 0 % (ref 0.0–0.2)

## 2024-06-22 LAB — FERRITIN: Ferritin: 64 ng/mL (ref 11–307)

## 2024-06-22 LAB — RETIC PANEL
Immature Retic Fract: 10.1 % (ref 2.3–15.9)
RBC.: 4.53 MIL/uL (ref 3.87–5.11)
Retic Count, Absolute: 27.6 K/uL (ref 19.0–186.0)
Retic Ct Pct: 0.6 % (ref 0.4–3.1)
Reticulocyte Hemoglobin: 34.4 pg (ref >27.9)

## 2024-06-22 LAB — IRON AND IRON BINDING CAPACITY (CC-WL,HP ONLY)
Iron: 89 ug/dL (ref 28–170)
Saturation Ratios: 22 % (ref 10.4–31.8)
TIBC: 412 ug/dL (ref 250–450)
UIBC: 323 ug/dL (ref 148–442)

## 2024-06-22 LAB — VITAMIN B12: Vitamin B-12: 1229 pg/mL — ABNORMAL HIGH (ref 180–914)

## 2024-06-27 ENCOUNTER — Telehealth: Payer: Self-pay

## 2024-06-27 NOTE — Telephone Encounter (Signed)
 Left message to confirm appt for 7/31

## 2024-06-28 ENCOUNTER — Inpatient Hospital Stay: Admitting: Hematology and Oncology

## 2024-07-31 NOTE — Progress Notes (Signed)
 Patient:   Sarah Armstrong                                            CSN:             [id number] DOB:       1931-06-19                                               MRN:            [id number]    Chief Complaint  Patient presents with  . Follow-up    Discuss results     SUBJECTIVE    Sarah Armstrong is a 89 y.o. female who presents for review of CT results.  The patient is accompanied today by her 2 daughters.  She continues to have issues with weight loss.  She states that she never started the mirtazapine to help with her appetite.  She had her CT on August 28 and the CT of the chest showed abnormalities within the left lung consistent with either a mass or possible infection.  The patient is currently not having any upper respiratory symptoms.  She denies hemoptysis.  She has not had any cough or shortness of breath.  She denies any chest pain.   The patient currently sees hematology for treatment of iron  deficiency.  Review of Systems  Constitutional:  Positive for malaise/fatigue and weight loss.  Eyes: Negative.   Respiratory: Negative.    Cardiovascular: Negative.   Gastrointestinal: Negative.   Genitourinary: Negative.   Musculoskeletal: Negative.     Past Medical History: Past Medical History:  Diagnosis Date  . Acquired hypothyroidism 04/18/2023  . Acquired hypothyroidism 04/18/2023  . ATHEROSCLEROSIS, CEREBRAL  RIGHT CVA   . Atrial fibrillation (HCC) 11/26/2016  . BASAL CELL CARCINOMA, HX OF 04/14/2004  . CKD (chronic kidney disease) stage 3, GFR 30-59 ml/min (HCC) 09/30/2016  . Colon polyp 05/06/2008  . Gastroesophageal reflux   . Hematuria   . Hemorrhoids   . Hyperlipidemia   . HYPERTENSION, BENIGN ESSENTIAL   . 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 New onset atrial fibrillation (HCC) 12/08/2015  . Osteoporosis 10/14/2003  . Prediabetes 03/29/2006    Social History:  Social History   Tobacco Use  . Smoking status: Never  . Smokeless tobacco: Never  Substance  Use Topics  . Alcohol use: No    Alcohol/week: 0.0 standard drinks of alcohol  . Drug use: No    Allergies: Allergies  Allergen Reactions  . Strawberry Anaphylaxis  . Walnuts Anaphylaxis  . Aspirin Swelling    Throat  . Codeine Other - See Comments    hallucinations  . Enalapril Other - See Comments    Cough, low blood pressure  . Melatonin Nausea Only  . Other     Strawberries walnuts  . Penicillin G Other - See Comments    Does not help symptoms  . Shellfish Containing Products   . Latex Rash    reddness    Current Meds: Current Outpatient Medications on File Prior to Visit  Medication Indication(s) Sig Dispense Refill  . mirtazapine (REMERON) 7.5 mg Tablet   take 1 tablet every night by mouth . 30 tablet 3  . cetirizine (  ZYRTEC) 10 mg Tablet   take 1 tablet every day by mouth . 90 tablet 1  . levothyroxine (SYNTHROID) 25 mcg Tablet   TAKE ONE AND ONE-HALF TABLETS DAILY AT 6:00 A.M. 135 tablet 3  . hydrocortisone  (ANUSOL -HC) 25 mg Suppository   1 suppository in the morning and 1 suppository before bedtime by Rectal route. 24 suppository 6  . lidocaine  5% (LIDODERM ) 5 % Adhesive Patch, Medicated   APPLY 1 PATCH TOPICALLY TO THE SKIN EVERY DAY. MAY WEAR UP TO 12 HOURS    . diazePAM  (VALIUM ) 5 mg Tablet   TAKE 1 TABLET BY MOUTH DAILY AS NEEDED FOR ANXIETY.. 30 tablet 0  . XARELTO  15 mg Tablet   TAKE 1 TABLET DAILY 90 tablet 3  . hydrALAZINE  (APRESOLINE ) 25 mg Tablet   TAKE 1 TABLET IN THE MORNING, 1 TABLET AT NOON AND 1 TABLET IN THE EVENING. TAKE WITH MEALS 270 tablet 3  . famotidine (PEPCID) 20 mg Tablet   TAKE 1 TABLET EVERY NIGHT 90 tablet 3  . ondansetron  (ZOFRAN  ODT) 4 mg Tablet, Rapid Dissolve   take 1 tablet every 8 (eight) hours as needed by mouth  for Nausea. 30 tablet 6  . estradioL (ESTRACE) 0.01 % (0.1 mg/gram) Cream   1 Applicatorful Two times a week by Vaginal route . 42.5 g 1  . nystatin (MYCOSTATIN) 100,000 unit/gram Powder   1 Application in the morning and  1 Application before bedtime by Topical route. 60 g 3  . losartan  (COZAAR ) 100 mg Tablet   take 0.5 tablets every day by mouth .    SABRA Loperamide 2 mg Tablet   take 2 mg by mouth .    . glucometer test strips   1 strip once a week by external route  TRUE METRIX TEST STRIPS ONCE A WEEK. 100 strip 1  . fluticasone propionate (FLONASE) 50 mcg/actuation Spray, Suspension   2 sprays by Both Nostrils route every night 1 Bottle 5  . calcium  carbonate (TUMS) 200 mg calcium  (500 mg) Tablet, Chewable   take 2 tablets by mouth every day    . sennosides/docusate sodium (SENNA-S PO)   take by mouth two times daily     . acetaminophen  (TYLENOL ) 500 mg Tablet   take 500 mg by mouth every 6 hours as needed for Pain    . amiodarone  (PACERONE ) 200 mg Tablet   take 200 mg by mouth    . diltiazem  (TIAZAC ) 120 mg Capsule, Sustained Release   take 1 Cap by mouth every day 30 Cap 0  . sodium chloride  (OCEAN) 0.65 % Aerosol, Spray   Reported on 10/29/2015     No current facility-administered medications on file prior to visit.     OBJECTIVE   BP (!) 138/48   Pulse 89   Temp 96.9 F (36.1 C) (Temporal)   Resp 18   Wt 67.6 kg (149 lb 1.6 oz)   SpO2 95%   BMI 23.88 kg/m  Body mass index is 23.88 kg/m.  Physical Exam   ASSESSMENT and PLAN     ICD-10-CM   1. Mass of lower lobe of left lung  R91.8 AMB REFERRAL TO PULMONOLOGY      I spent 20 minutes with the patient and her 2 daughters discussing her CT findings.  CT of the chest showed abnormalities within the left lung and  the CT of her abdomen and pelvis were negative.  I advised that she be referred to pulmonology for further workup  of the abnormality seen within her left lung including a mass or infection.  The patient and her family requested to see a pulmonologist at [facility].  I entered her referral.  The patient and the family agreed with the plan as outlined above.   She is to keep her follow-up appointment for December.   This note was  dictated using Dragon Medical One voice recognition software may contain the occasional inadvertent typographical or nomenclature errors. Please contact me for clarification questions   Rock DELENA Reef, MD  07/31/2024 11:39 AM

## 2024-08-02 ENCOUNTER — Emergency Department (HOSPITAL_BASED_OUTPATIENT_CLINIC_OR_DEPARTMENT_OTHER)
Admission: EM | Admit: 2024-08-02 | Discharge: 2024-08-03 | Disposition: A | Discharge status: Home / Self Care | Attending: Emergency Medicine | Admitting: Emergency Medicine

## 2024-08-02 ENCOUNTER — Encounter (HOSPITAL_BASED_OUTPATIENT_CLINIC_OR_DEPARTMENT_OTHER): Payer: Self-pay | Admitting: Emergency Medicine

## 2024-08-02 ENCOUNTER — Emergency Department (HOSPITAL_BASED_OUTPATIENT_CLINIC_OR_DEPARTMENT_OTHER)

## 2024-08-02 ENCOUNTER — Other Ambulatory Visit: Payer: Self-pay

## 2024-08-02 DIAGNOSIS — Z7901 Long term (current) use of anticoagulants: Secondary | ICD-10-CM | POA: Insufficient documentation

## 2024-08-02 DIAGNOSIS — E785 Hyperlipidemia, unspecified: Secondary | ICD-10-CM | POA: Diagnosis not present

## 2024-08-02 DIAGNOSIS — R0600 Dyspnea, unspecified: Secondary | ICD-10-CM | POA: Insufficient documentation

## 2024-08-02 DIAGNOSIS — I4891 Unspecified atrial fibrillation: Secondary | ICD-10-CM | POA: Diagnosis not present

## 2024-08-02 DIAGNOSIS — R0602 Shortness of breath: Secondary | ICD-10-CM | POA: Diagnosis present

## 2024-08-02 DIAGNOSIS — I10 Essential (primary) hypertension: Secondary | ICD-10-CM | POA: Insufficient documentation

## 2024-08-02 DIAGNOSIS — R5383 Other fatigue: Secondary | ICD-10-CM | POA: Diagnosis present

## 2024-08-02 DIAGNOSIS — Z859 Personal history of malignant neoplasm, unspecified: Secondary | ICD-10-CM | POA: Insufficient documentation

## 2024-08-02 DIAGNOSIS — Z8616 Personal history of COVID-19: Secondary | ICD-10-CM | POA: Insufficient documentation

## 2024-08-02 LAB — HEPATIC FUNCTION PANEL
ALT: 19 U/L (ref 0–44)
AST: 32 U/L (ref 15–41)
Albumin: 4.1 g/dL (ref 3.5–5.0)
Alkaline Phosphatase: 82 U/L (ref 38–126)
Bilirubin, Direct: 0.2 mg/dL (ref 0.0–0.2)
Indirect Bilirubin: 0.4 mg/dL (ref 0.3–0.9)
Total Bilirubin: 0.6 mg/dL (ref 0.0–1.2)
Total Protein: 7 g/dL (ref 6.5–8.1)

## 2024-08-02 LAB — RESP PANEL BY RT-PCR (RSV, FLU A&B, COVID)  RVPGX2
Influenza A by PCR: NEGATIVE
Influenza B by PCR: NEGATIVE
Resp Syncytial Virus by PCR: NEGATIVE
SARS Coronavirus 2 by RT PCR: NEGATIVE

## 2024-08-02 LAB — BASIC METABOLIC PANEL WITH GFR
Anion gap: 11 (ref 5–15)
BUN: 16 mg/dL (ref 8–23)
CO2: 25 mmol/L (ref 22–32)
Calcium: 9.3 mg/dL (ref 8.9–10.3)
Chloride: 100 mmol/L (ref 98–111)
Creatinine, Ser: 0.98 mg/dL (ref 0.44–1.00)
GFR, Estimated: 54 mL/min — ABNORMAL LOW (ref >60)
Glucose, Bld: 151 mg/dL — ABNORMAL HIGH (ref 70–99)
Potassium: 4.3 mmol/L (ref 3.5–5.1)
Sodium: 137 mmol/L (ref 135–145)

## 2024-08-02 LAB — TROPONIN T, HIGH SENSITIVITY: Troponin T High Sensitivity: 20 ng/L — ABNORMAL HIGH (ref 0–19)

## 2024-08-02 LAB — CBC
HCT: 40.5 % (ref 36.0–46.0)
Hemoglobin: 13.5 g/dL (ref 12.0–15.0)
MCH: 31.2 pg (ref 26.0–34.0)
MCHC: 33.3 g/dL (ref 30.0–36.0)
MCV: 93.5 fL (ref 80.0–100.0)
Platelets: 171 K/uL (ref 150–400)
RBC: 4.33 MIL/uL (ref 3.87–5.11)
RDW: 16.6 % — ABNORMAL HIGH (ref 11.5–15.5)
WBC: 9.4 K/uL (ref 4.0–10.5)
nRBC: 0 % (ref 0.0–0.2)

## 2024-08-02 LAB — LIPASE, BLOOD: Lipase: 21 U/L (ref 11–51)

## 2024-08-02 MED ORDER — SODIUM CHLORIDE 0.9 % IV BOLUS
500.0000 mL | Freq: Once | INTRAVENOUS | Status: AC
  Administered 2024-08-02: 500 mL via INTRAVENOUS

## 2024-08-02 NOTE — ED Triage Notes (Signed)
 Pt reports her O2 was low at home (96%), pt in NAD in triage, states she just haven't been breathing good all day, denies cough, fever, congestion  Also reports recent CT with a finding in the LLL, unsure of what it was exactly

## 2024-08-02 NOTE — ED Provider Notes (Signed)
 Care assumed at shift change. Here for subjective SOB, no hypoxia, awaiting labs. CXR is clear.  Physical Exam  BP (!) 203/64 (BP Location: Right Arm)   Pulse 82   Temp (!) 97.5 F (36.4 C) (Oral)   Resp 19   Ht 5' 7 (1.702 m)   Wt 68 kg   SpO2 97%   BMI 23.49 kg/m   Physical Exam  Procedures  Procedures  ED Course / MDM   Clinical Course as of 08/03/24 0159  Thu Aug 02, 2024  2305 CBC is normal.  [CS]  2321 BMP, LFTs and Lipase are normal. Initial trop is borderline elevated. Will check a delta.  [CS]  2342 Covid/Flu/RSV swab is neg.  [CS]  Fri Aug 03, 2024  0018 UA with few WBC, trace LE. No LUTS [CS]  0148 Delta trop is flat. No concern for AMI. Patient resting comfortably in no distress. Plan discharge with outpatient Pulm follow up. PCP follow up, RTED for any other concerns.   [CS]    Clinical Course User Index [CS] Roselyn Carlin NOVAK, MD   Medical Decision Making Given presenting complaint, I considered that admission might be necessary. After review of results from ED lab and/or imaging studies, admission to the hospital is not indicated at this time.    Problems Addressed: Dyspnea, unspecified type: acute illness or injury  Amount and/or Complexity of Data Reviewed Labs:  Decision-making details documented in ED Course.  Risk Decision regarding hospitalization.          Roselyn Carlin NOVAK, MD 08/03/24 [phone]

## 2024-08-02 NOTE — ED Notes (Signed)
 Poor po intake. Per patient.

## 2024-08-02 NOTE — ED Provider Notes (Signed)
 Emergency Department Provider Note   I have reviewed the triage vital signs and the nursing notes.   HISTORY  Chief Complaint Shortness of Breath   HPI Sarah Armstrong is a 89 y.o. female past history reviewed below including hypertension, hyperlipidemia, A-fib on anticoagulation presents emergency department with shortness of breath, fatigue, and poor appetite.  Symptoms worsening significantly in the past several days.  Her daughter at bedside states that she spends part of her time living in [place]  and at the [facility] health clinic she had a CT scan showing mass versus infection.  She was referred to pulmonology at [facility] but appointment has not yet been made.  No known fevers.  Patient denies any dysuria, hesitancy, urgency.  With worsening shortness of breath symptoms the patient's daughter presents to the ED for evaluation.   Past Medical History:  Diagnosis Date   Atrial fibrillation (HCC)    Cancer (HCC) skin   COVID    GERD (gastroesophageal reflux disease)    Hyperlipidemia    Hypertension    Stroke [facility])     Review of Systems  Constitutional: No fever/chills. Positive fatigue.  Cardiovascular: Denies chest pain. Respiratory: Positive shortness of breath. Gastrointestinal: No abdominal pain.  No nausea, no vomiting.   Genitourinary: Negative for dysuria. Neurological: Negative for headaches.  ____________________________________________   PHYSICAL EXAM:  VITAL SIGNS: ED Triage Vitals  Encounter Vitals Group     BP 08/02/24 2220 (!) 203/64     Pulse Rate 08/02/24 2220 82     Resp 08/02/24 2220 19     Temp 08/02/24 2220 (!) 97.5 F (36.4 C)     Temp Source 08/02/24 2220 Oral     SpO2 08/02/24 2220 96 %     Weight 08/02/24 2216 150 lb (68 kg)     Height 08/02/24 2216 5' 7 (1.702 m)   Constitutional: Alert and oriented. Well appearing and in no acute distress. Eyes: Conjunctivae are normal.  Head: Atraumatic. Nose: No  congestion/rhinnorhea. Mouth/Throat: Mucous membranes are moist.  Neck: No stridor.  Cardiovascular: Normal rate, regular rhythm. Good peripheral circulation. Grossly normal heart sounds.   Respiratory: Normal respiratory effort.  No retractions. Lungs CTAB. Gastrointestinal: Soft and nontender. No distention.  Musculoskeletal: No gross deformities of extremities. Neurologic:  Normal speech and language.  Skin:  Skin is warm, dry and intact. No rash noted.   ____________________________________________   LABS (all labs ordered are listed, but only abnormal results are displayed)  Labs Reviewed  RESP PANEL BY RT-PCR (RSV, FLU A&B, COVID)  [id number]  BASIC METABOLIC PANEL WITH GFR  CBC  HEPATIC FUNCTION PANEL  LIPASE, BLOOD  URINALYSIS, W/ REFLEX TO CULTURE (INFECTION SUSPECTED)  TROPONIN T, HIGH SENSITIVITY   ____________________________________________  EKG   EKG Interpretation Date/Time:  Thursday August 02 2024 22:20:29 EDT Ventricular Rate:  82 PR Interval:  183 QRS Duration:  104 QT Interval:  417 QTC Calculation: 487 R Axis:   2  Text Interpretation: Sinus rhythm Borderline prolonged QT interval Confirmed by Darra Chew [phone]) on 08/02/2024 10:33:32 PM        ____________________________________________  RADIOLOGY  No results found.  ____________________________________________   PROCEDURES  Procedure(s) performed:   Procedures   ____________________________________________   INITIAL IMPRESSION / ASSESSMENT AND PLAN / ED COURSE  Pertinent labs & imaging results that were available during my care of the patient were reviewed by me and considered in my medical decision making (see chart for details).   This patient is Presenting  for Evaluation of SOB, which does require a range of treatment options, and is a complaint that involves a high risk of morbidity and mortality.  The Differential Diagnoses include viral process, CAP, lung mass, pleural  effusion, dehydration, sepsis, etc.   Critical Interventions-    Medications  sodium chloride  0.9 % bolus 500 mL (has no administration in time range)    Reassessment after intervention:     I did obtain Additional Historical Information from daughter at bedside.   Clinical Laboratory Tests Ordered, included ***  Radiologic Tests Ordered, included CXR. I independently interpreted the images and agree with radiology interpretation.   Cardiac Monitor Tracing which shows NSR.    Social Determinants of Health Risk patient is a non-smoker.   Consult complete with  Medical Decision Making: Summary:  Patient presents emergency department with generalized weakness and shortness of breath.  No hypoxemia.  No altered mental status.  Appears reasonably well-hydrated with elevated BP but fairly asymptomatic.  Plan for screening blood work, chest x-ray, gentle IV fluid bolus and reassess.  Patient does not appear clinically volume overloaded.  Reevaluation with update and discussion with   ***Considered admission***  Patient's presentation is most consistent with acute presentation with potential threat to life or bodily function.   Disposition:   ____________________________________________  FINAL CLINICAL IMPRESSION(S) / ED DIAGNOSES  Final diagnoses:  None     NEW OUTPATIENT MEDICATIONS STARTED DURING THIS VISIT:  New Prescriptions   No medications on file    Note:  This document was prepared using Dragon voice recognition software and may include unintentional dictation errors.  Fonda Law, MD, [facility]

## 2024-08-03 ENCOUNTER — Telehealth: Payer: Self-pay

## 2024-08-03 ENCOUNTER — Other Ambulatory Visit: Payer: Self-pay | Admitting: Hematology and Oncology

## 2024-08-03 DIAGNOSIS — R0600 Dyspnea, unspecified: Secondary | ICD-10-CM | POA: Diagnosis not present

## 2024-08-03 DIAGNOSIS — R9389 Abnormal findings on diagnostic imaging of other specified body structures: Secondary | ICD-10-CM

## 2024-08-03 LAB — URINALYSIS, W/ REFLEX TO CULTURE (INFECTION SUSPECTED)
Bilirubin Urine: NEGATIVE
Glucose, UA: NEGATIVE mg/dL
Hgb urine dipstick: NEGATIVE
Ketones, ur: NEGATIVE mg/dL
Nitrite: NEGATIVE
Protein, ur: NEGATIVE mg/dL
Specific Gravity, Urine: 1.015 (ref 1.005–1.030)
pH: 7 (ref 5.0–8.0)

## 2024-08-03 LAB — TROPONIN T, HIGH SENSITIVITY: Troponin T High Sensitivity: 20 ng/L — ABNORMAL HIGH (ref 0–19)

## 2024-08-03 NOTE — Telephone Encounter (Signed)
 Patient scheduled and added to wait list.

## 2024-08-03 NOTE — Progress Notes (Signed)
 CT chest from [facility] health received. Recommendation was to repeat CT in 3 months to follow up on the lung opacity. I ordered this. I will see her later this fall a well.

## 2024-08-03 NOTE — Telephone Encounter (Signed)
 Copied from CRM [id number]. Topic: Appointments - Appointment Scheduling >> Aug 03, 2024 11:30 AM Shona RAMAN wrote: Patient/patient representative is calling to schedule an appointment. Refer to attachments for appointment information.  Both patient pcp linda buchanan and [facility] is making this an emergency visit for finding of something on lower left lobe.

## 2024-08-09 ENCOUNTER — Encounter: Payer: Self-pay | Admitting: Internal Medicine

## 2024-08-20 ENCOUNTER — Ambulatory Visit

## 2024-08-20 ENCOUNTER — Telehealth: Payer: Self-pay

## 2024-08-20 VITALS — BP 149/68 | HR 85 | Temp 97.8°F | Ht 67.0 in | Wt 148.0 lb

## 2024-08-20 DIAGNOSIS — R918 Other nonspecific abnormal finding of lung field: Secondary | ICD-10-CM

## 2024-08-20 DIAGNOSIS — A319 Mycobacterial infection, unspecified: Secondary | ICD-10-CM

## 2024-08-20 NOTE — Progress Notes (Signed)
 Subjective:   PATIENT ID: Sarah Armstrong GENDER: female DOB: 06-Oct-1931, MRN: [id number]   HPI 89 year old female with a past medical history of atrial fibrillation on Xarelto , hypothyroidism on levothyroxine who is presenting to the pulmonary clinic as a referral due to findings on CT chest.  Patient is with her daughter.  They both say that over the past 6 months she has lost 10 pounds and has less energy than usual.  Her PCP ordered CT chest abdomen and pelvis which I am only able to see the report.  It mentions clustered nodular infiltrates in the right upper lobe posteriorly and laterally.  Superior segment of left lower lobe there is a consolidation as well as in the lingula with air bronchograms.  There is a mention of a 3.2 mm noncalcified nodule in the right lower lobe and 4.6 mm nodule in the right lower lobe as well.  The report suggest that these findings are most likely inflammatory or infectious.  There is also mild elevation in the left hemidiaphragm.  Other than her loss of energy and weight loss, the patient denies any pulmonary symptoms including shortness of breath or cough with mucus production.  She denies any fevers or chills or night sweats.  She is a non-smoker but she was exposed to secondhand smoking from her husband.  Past Medical History:  Diagnosis Date   Atrial fibrillation (HCC)    Cancer (HCC) skin   COVID    GERD (gastroesophageal reflux disease)    Hyperlipidemia    Hypertension    Stroke (HCC)      Family History  Problem Relation Age of Onset   Hypertension Mother    Heart disease Father        CHF   Stroke Maternal Grandmother    Cancer Maternal Grandfather    Heart disease Brother 3   Leukemia Brother    Heart disease Brother 31   COPD Sister    Parkinson's disease Sister    Hypertension Child    Diabetes Child      Social History   Socioeconomic History   Marital status: Widowed    Spouse name: Not on file   Number of children: 2    Years of education: Not on file   Highest education level: Not on file  Occupational History   Not on file  Tobacco Use   Smoking status: Never   Smokeless tobacco: Never  Vaping Use   Vaping status: Never Used  Substance and Sexual Activity   Alcohol use: No    Alcohol/week: 0.0 standard drinks of alcohol   Drug use: No   Sexual activity: Not on file  Other Topics Concern   Not on file  Social History Narrative   Lives alone.  Husband died Sep 26, 2016   Social Drivers of Corporate investment banker Strain: Not on file  Food Insecurity: Not on file  Transportation Needs: Not on file  Physical Activity: Not on file  Stress: Not on file  Social Connections: Not on file  Intimate Partner Violence: Not on file     Allergies  Allergen Reactions   Aspirin Anaphylaxis and Swelling    Throat Throat   Other Anaphylaxis and Swelling    Walnuts, throat    Strawberry Extract Anaphylaxis   Codeine Other (See Comments)    hallucinations   Enalapril Other (See Comments)    Cough, low blood pressure   Melatonin Nausea Only   Penicillin G Other (See Comments)  Does not help symptoms   Shellfish Allergy     Other reaction(s): Other (See Comments)   Shellfish-Derived Products Other (See Comments)    From allergy testing, IVP dye is fine   Latex Rash    reddness     Outpatient Medications Prior to Visit  Medication Sig Dispense Refill   acetaminophen  (TYLENOL ) 500 MG tablet Take 500 mg by mouth 2 (two) times daily as needed for mild pain.      amiodarone  (PACERONE ) 200 MG tablet TAKE 1 TABLET DAILY 90 tablet 3   cetirizine (ZYRTEC) 10 MG tablet Take 10 mg by mouth daily.     Cholecalciferol 50 MCG (2000 UT) TABS Take 1 tablet by mouth daily.     diazepam  (VALIUM ) 5 MG tablet Take 5 mg by mouth daily as needed for anxiety.     diltiazem  (CARDIZEM  CD) 120 MG 24 hr capsule TAKE 1 CAPSULE DAILY 90 capsule 3   estradiol (ESTRACE) 0.1 MG/GM vaginal cream Place 1 Applicatorful  vaginally 2 (two) times a week. Wed and Sun     famotidine (PEPCID) 20 MG tablet Take by mouth.     hydrALAZINE  (APRESOLINE ) 10 MG tablet Take 1 tablet (10 mg total) by mouth 3 (three) times daily as needed (high blood pressure). 90 tablet 0   hydrALAZINE  (APRESOLINE ) 25 MG tablet Take 1 tablet (25 mg total) by mouth 3 (three) times daily. 270 tablet 3   levothyroxine (SYNTHROID) 25 MCG tablet Take 25 mcg by mouth daily before breakfast.     lidocaine  (LIDODERM ) 5 % APPLY 1 PATCH TOPICALLY TO THE SKIN EVERY DAY. MAY WEAR UP TO 12 HOURS     loperamide (IMODIUM A-D) 2 MG tablet Take by mouth.     losartan  (COZAAR ) 100 MG tablet TAKE 1 TABLET DAILY 90 tablet 3   mirtazapine (REMERON) 7.5 MG tablet Take 7.5 mg by mouth.     nystatin (MYCOSTATIN/NYSTOP) powder Apply 1 Application topically as needed.     Probiotic Product (PROBIOTIC FORMULA) CAPS Take 1 capsule by mouth daily as needed (prevent infection).      rivaroxaban  (XARELTO ) 15 MG TABS tablet Take 1 tablet (15 mg total) by mouth daily with supper. 90 tablet 3   No facility-administered medications prior to visit.    ROS Reviewed all systems and reported negative except as above     Objective:   Vitals:   08/20/24 1419  BP: (!) 149/68  Pulse: 85  Temp: 97.8 F (36.6 C)  TempSrc: Temporal  SpO2: 94%  Weight: 148 lb (67.1 kg)  Height: 5' 7 (1.702 m)    Physical Exam General: Elderly female not in acute distress Chest: Clear to auscultation bilaterally Heart: Regular rate and rhythm, normal S1, no S2 Extremities: Warm, well-perfused, no edema Neuro: Grossly intact    CBC    Component Value Date/Time   WBC 9.4 08/02/2024 2240   RBC 4.33 08/02/2024 2240   HGB 13.5 08/02/2024 2240   HGB 13.2 06/22/2024 1346   HCT 40.5 08/02/2024 2240   HCT 30.2 (L) 03/08/2018 1530   PLT 171 08/02/2024 2240   PLT 177 06/22/2024 1346   MCV 93.5 08/02/2024 2240   MCH 31.2 08/02/2024 2240   MCHC 33.3 08/02/2024 2240   RDW 16.6 (H)  08/02/2024 2240   LYMPHSABS 1.1 06/22/2024 1346   MONOABS 0.9 06/22/2024 1346   EOSABS 0.1 06/22/2024 1346   BASOSABS 0.1 06/22/2024 1346     Chest imaging: CT chest 07/26/2024 no images available  but I can see the reports and is as mentioned above  PFT: No PFTs on file      Assessment & Plan:   Assessment & Plan Pulmonary infiltrate Unfortunately I cannot see the CT scan that she had.  Differential diagnoses include pneumonia, atypical infection, malignancy.  We discussed this differential diagnoses in depth.  Patient denied any acute infectious symptoms at the time she had a CAT scan.  She denies any dysphagia or aspiration events.  She denies any URI symptoms.  Given 6 months weight loss and loss of energy concern for atypical infection and malignancy is high at her age.  Discussed obtaining CT image and obtaining induced sputum for further evaluation.  Discussed potentially proceeding with bronchoscopy with BAL versus robotic bronchoscopy with sampling if needed.  Patient would like to get more answers but we will do a stepwise approach in obtaining a diagnosis beginning with obtaining the CT scan images and an induced sputum. Mycobacteria, atypical Patient with significant weight loss and loss of energy with an asymptomatic pulmonary infiltrate concerning for mycobacterial infection.  Differential diagnosis as above.  Plan to obtain induced sputum.  Orders Placed This Encounter  Procedures   Respiratory or Resp and Sputum Culture    Standing Status:   Future    Expiration Date:   08/20/2025   AFB Culture & Smear    Standing Status:   Future    Expiration Date:   08/20/2025   Sputum induction    Standing Status:   Future    Expected Date:   08/20/2024    Expiration Date:   08/20/2025    Where should this test be performed?:   [facility]      Zola Herter, MD [facility] Office: [phone]

## 2024-08-20 NOTE — Patient Instructions (Signed)
 It was nice meeting you today.   We are trying to get the images of your CT scan to look at it. The concern is for an atypical infection or cancer or just a simple infection. We will get an induced sputum sample as a first step. This is usually done in the hospital.   I will reach out to Sandra once I have some updates

## 2024-08-20 NOTE — Telephone Encounter (Signed)
 CT chest requested from [facility] via power share.

## 2024-08-24 ENCOUNTER — Inpatient Hospital Stay
Admission: RE | Admit: 2024-08-24 | Discharge: 2024-08-24 | Disposition: A | Discharge status: Home / Self Care | Payer: Self-pay | Source: Ambulatory Visit

## 2024-08-24 ENCOUNTER — Telehealth: Payer: Self-pay

## 2024-08-24 DIAGNOSIS — R918 Other nonspecific abnormal finding of lung field: Secondary | ICD-10-CM

## 2024-08-24 NOTE — Telephone Encounter (Signed)
 Discussed with Daughter [name], the CT scan performed on 07/27/24. Does not look like an atypical infection, rounded consolidation with differential including malignancy or atelectasis and mucous plugging.  Discussed with daughter no need for sputum and plan for repeat CT scan in 3 months.    Zola Herter, MD  Pulmonary & Critical Care Office: [phone]   See Amion for personal pager PCCM on call pager [phone] until 7pm. Please call Elink 7p-7a. [phone]

## 2024-08-24 NOTE — Telephone Encounter (Signed)
 Copied from CRM [id number]. Topic: Clinical - Request for Lab/Test Order >> Aug 23, 2024 10:00 AM Russell PARAS wrote: Reason for CRM:   Pt's daughter, [name], is contacting clinic to report her mother is unable to provide a sputum samples, even with deep breathing treatments. She believes the next step may need to be the induced sputum sample at the hospital.  She has several questions:  Will the saline solution for the procedure affect her mother's blood pressure? How much solution will the pt need to drink for the procedure?  Requested call back  CB#  [phone]  Dr. Zaida please advise

## 2024-08-24 NOTE — Telephone Encounter (Signed)
 See other encounter Dr. Zaida spoke with pts daughter

## 2024-08-24 NOTE — Telephone Encounter (Signed)
 Images have been unloaded and can be found under 08/24/2024 CT order.

## 2024-08-27 NOTE — Telephone Encounter (Signed)
 noted

## 2024-08-31 ENCOUNTER — Encounter: Payer: Self-pay | Admitting: Cardiology

## 2024-08-31 NOTE — Telephone Encounter (Signed)
 Error

## 2024-09-18 ENCOUNTER — Inpatient Hospital Stay

## 2024-09-18 ENCOUNTER — Inpatient Hospital Stay: Attending: Hematology and Oncology | Admitting: Hematology and Oncology

## 2024-09-18 VITALS — BP 165/60 | HR 89 | Temp 97.4°F | Resp 18 | Wt 148.4 lb

## 2024-09-18 DIAGNOSIS — D500 Iron deficiency anemia secondary to blood loss (chronic): Secondary | ICD-10-CM | POA: Diagnosis not present

## 2024-09-18 DIAGNOSIS — D509 Iron deficiency anemia, unspecified: Secondary | ICD-10-CM | POA: Diagnosis present

## 2024-09-18 DIAGNOSIS — Z79899 Other long term (current) drug therapy: Secondary | ICD-10-CM | POA: Diagnosis not present

## 2024-09-18 DIAGNOSIS — R0602 Shortness of breath: Secondary | ICD-10-CM | POA: Diagnosis not present

## 2024-09-18 LAB — CBC WITH DIFFERENTIAL/PLATELET
Abs Immature Granulocytes: 0.03 K/uL (ref 0.00–0.07)
Basophils Absolute: 0.1 K/uL (ref 0.0–0.1)
Basophils Relative: 1 %
Eosinophils Absolute: 0.1 K/uL (ref 0.0–0.5)
Eosinophils Relative: 2 %
HCT: 41.7 % (ref 36.0–46.0)
Hemoglobin: 13.8 g/dL (ref 12.0–15.0)
Immature Granulocytes: 0 %
Lymphocytes Relative: 13 %
Lymphs Abs: 1 K/uL (ref 0.7–4.0)
MCH: 31.9 pg (ref 26.0–34.0)
MCHC: 33.1 g/dL (ref 30.0–36.0)
MCV: 96.5 fL (ref 80.0–100.0)
Monocytes Absolute: 1 K/uL (ref 0.1–1.0)
Monocytes Relative: 14 %
Neutro Abs: 5.4 K/uL (ref 1.7–7.7)
Neutrophils Relative %: 70 %
Platelets: 200 K/uL (ref 150–400)
RBC: 4.32 MIL/uL (ref 3.87–5.11)
RDW: 14.7 % (ref 11.5–15.5)
WBC: 7.6 K/uL (ref 4.0–10.5)
nRBC: 0 % (ref 0.0–0.2)

## 2024-09-18 LAB — IRON AND IRON BINDING CAPACITY (CC-WL,HP ONLY)
Iron: 63 ug/dL (ref 28–170)
Saturation Ratios: 13 % (ref 10.4–31.8)
TIBC: 473 ug/dL — ABNORMAL HIGH (ref 250–450)
UIBC: 410 ug/dL (ref 148–442)

## 2024-09-18 LAB — RETIC PANEL
Immature Retic Fract: 13.8 % (ref 2.3–15.9)
RBC.: 4.25 MIL/uL (ref 3.87–5.11)
Retic Count, Absolute: 56.1 K/uL (ref 19.0–186.0)
Retic Ct Pct: 1.3 % (ref 0.4–3.1)
Reticulocyte Hemoglobin: 35.6 pg (ref >27.9)

## 2024-09-18 LAB — VITAMIN B12: Vitamin B-12: 1021 pg/mL — ABNORMAL HIGH (ref 180–914)

## 2024-09-18 LAB — FERRITIN: Ferritin: 24 ng/mL (ref 11–307)

## 2024-09-18 NOTE — Progress Notes (Signed)
 [facility] Cancer Follow up:    Sarah Handing, MD [address] [place] [address]   DIAGNOSIS: Iron  deficiency Anemia   SUMMARY OF HEMATOLOGIC HISTORY: Evaluation by Dr. Loretha 03/16/2024 for iron -deficiency anemia: Ferritin 4, iron  16, saturation 3, TIBC 568, B12 410, hemoglobin 9 IV Venofer  200mg  weekly x 5 beginning 04/09/2024 Hemoglobin on 05/28/2024 12.3  CURRENT THERAPY: intermittent IV iron   INTERVAL HISTORY:  Discussed the use of AI scribe software for clinical note transcription with the patient, who gave verbal consent to proceed.  History of Present Illness   Sarah Armstrong is a 89 year old female who presents with weakness and shortness of breath.  She experiences weakness and shortness of breath, particularly during household activities, with her heart rate increasing to 93-94 beats per minute, necessitating rest for about five minutes to recover. She felt some improvement after an iron  infusion in June, but her symptoms have since returned.  She is not taking any iron  supplements due to previous side effects, including nausea. No blood in stool or black stools is noted. Her appetite remains unchanged, although she takes medication every other day to help with it.  In September, her hemoglobin levels were reported as great, but she is unsure of her current levels. She experiences leg swelling if standing for extended periods, which resolves after lying down for an hour or two in the afternoon.  She has a history of thyroid  concerns due to weight loss over the past six months, but her thyroid  function has been checked three times and found to be normal.  She spends significant time in her recliner, engaging in activities like crocheting and playing word games on the computer. She does not walk much around the house, which may contribute to her shortness of breath during physical activity. Her sleep pattern includes lying down in the afternoon and  waking up at night, during which she plays games on the computer.   Patient Active Problem List   Diagnosis Date Noted   IDA (iron  deficiency anemia) 03/16/2024   Bruit 09/05/2022   SOB (shortness of breath) 09/16/2020   AKI (acute kidney injury) 03/08/2018   Acute blood loss anemia 03/08/2018   GI bleed 03/07/2018   Dyslipidemia 02/24/2018   Bradycardia 02/24/2018   Bilateral carotid artery stenosis 02/24/2018   Essential hypertension 10/07/2017   Hypotension 10/07/2017   Non-rheumatic mitral regurgitation 10/07/2017   Persistent atrial fibrillation (HCC)    Atrial fibrillation (HCC) 02/13/2016    is allergic to aspirin, other, strawberry extract, codeine, enalapril, melatonin, penicillin g, shellfish allergy, shellfish protein-containing drug products, and latex.  MEDICAL HISTORY: Past Medical History:  Diagnosis Date   Atrial fibrillation (HCC)    Cancer (HCC) skin   COVID    GERD (gastroesophageal reflux disease)    Hyperlipidemia    Hypertension    Stroke [facility])     SURGICAL HISTORY: Past Surgical History:  Procedure Laterality Date   CARDIOVERSION N/A 03/04/2016   Procedure: CARDIOVERSION;  Surgeon: Wilbert JONELLE Bihari, MD;  Location: [facility] ENDOSCOPY;  Service: Cardiovascular;  Laterality: N/A;   CATARACT EXTRACTION  2015   COLONOSCOPY  2009   COLONOSCOPY WITH PROPOFOL  N/A 03/09/2018   Procedure: COLONOSCOPY WITH PROPOFOL ;  Surgeon: Donnald Charleston, MD;  Location: [facility] ENDOSCOPY;  Service: Endoscopy;  Laterality: N/A;   ESOPHAGOGASTRODUODENOSCOPY (EGD) WITH PROPOFOL  N/A 03/08/2018   Procedure: ESOPHAGOGASTRODUODENOSCOPY (EGD) WITH PROPOFOL ;  Surgeon: Donnald Charleston, MD;  Location: [facility] ENDOSCOPY;  Service: Endoscopy;  Laterality: N/A;  GIVENS CAPSULE STUDY N/A 03/09/2018   Procedure: GIVENS CAPSULE STUDY;  Surgeon: Donnald Charleston, MD;  Location: [facility] ENDOSCOPY;  Service: Endoscopy;  Laterality: N/A;   TONSILLECTOMY  1953   TOTAL ABDOMINAL HYSTERECTOMY  1983    SOCIAL  HISTORY: Social History   Socioeconomic History   Marital status: Widowed    Spouse name: Not on file   Number of children: 2   Years of education: Not on file   Highest education level: Not on file  Occupational History   Not on file  Tobacco Use   Smoking status: Never   Smokeless tobacco: Never  Vaping Use   Vaping status: Never Used  Substance and Sexual Activity   Alcohol use: No    Alcohol/week: 0.0 standard drinks of alcohol   Drug use: No   Sexual activity: Not on file  Other Topics Concern   Not on file  Social History Narrative   Lives alone.  Husband died 10-06-16   Social Drivers of Corporate investment banker Strain: Not on file  Food Insecurity: Not on file  Transportation Needs: Not on file  Physical Activity: Not on file  Stress: Not on file  Social Connections: Not on file  Intimate Partner Violence: Not on file    FAMILY HISTORY: Family History  Problem Relation Age of Onset   Hypertension Mother    Heart disease Father        CHF   Stroke Maternal Grandmother    Cancer Maternal Grandfather    Heart disease Brother 67   Leukemia Brother    Heart disease Brother 98   COPD Sister    Parkinson's disease Sister    Hypertension Child    Diabetes Child     Review of Systems  Constitutional:  Positive for fatigue. Negative for appetite change, chills, fever and unexpected weight change.  HENT:   Negative for hearing loss, lump/mass and trouble swallowing.   Eyes:  Negative for eye problems and icterus.  Respiratory:  Negative for chest tightness, cough and shortness of breath.   Cardiovascular:  Negative for chest pain, leg swelling and palpitations.  Gastrointestinal:  Negative for abdominal distention, abdominal pain, constipation, diarrhea, nausea and vomiting.  Endocrine: Negative for hot flashes.  Genitourinary:  Negative for difficulty urinating.   Musculoskeletal:  Positive for arthralgias.  Skin:  Negative for itching and rash.   Neurological:  Negative for dizziness, extremity weakness, headaches and numbness.  Hematological:  Negative for adenopathy. Does not bruise/bleed easily.  Psychiatric/Behavioral:  Negative for depression. The patient is not nervous/anxious.       PHYSICAL EXAMINATION     Vitals:   09/18/24 1022  BP: (!) 165/60  Pulse: 89  Resp: 18  Temp: (!) 97.4 F (36.3 C)  SpO2: 96%    Physical Exam Constitutional:      General: She is not in acute distress.    Appearance: Normal appearance. She is not ill-appearing or toxic-appearing.     Comments: Examined in wheelchair  HENT:     Head: Normocephalic and atraumatic.     Mouth/Throat:     Mouth: Mucous membranes are moist.     Pharynx: Oropharynx is clear. No oropharyngeal exudate or posterior oropharyngeal erythema.  Eyes:     General: No scleral icterus. Cardiovascular:     Rate and Rhythm: Normal rate and regular rhythm.     Pulses: Normal pulses.     Heart sounds: Normal heart sounds.  Pulmonary:  Effort: Pulmonary effort is normal.     Breath sounds: Normal breath sounds.  Abdominal:     General: Abdomen is flat. Bowel sounds are normal. There is no distension.     Palpations: Abdomen is soft.     Tenderness: There is no abdominal tenderness.  Musculoskeletal:        General: No swelling.     Cervical back: Neck supple.  Lymphadenopathy:     Cervical: No cervical adenopathy.  Skin:    General: Skin is warm and dry.     Findings: No rash.  Neurological:     General: No focal deficit present.     Mental Status: She is alert.  Psychiatric:        Mood and Affect: Mood normal.        Behavior: Behavior normal.     LABORATORY DATA:  CBC    Component Value Date/Time   WBC 9.4 08/02/2024 2240   RBC 4.33 08/02/2024 2240   HGB 13.5 08/02/2024 2240   HGB 13.2 06/22/2024 1346   HCT 40.5 08/02/2024 2240   HCT 30.2 (L) 03/08/2018 1530   PLT 171 08/02/2024 2240   PLT 177 06/22/2024 1346   MCV 93.5 08/02/2024  2240   MCH 31.2 08/02/2024 2240   MCHC 33.3 08/02/2024 2240   RDW 16.6 (H) 08/02/2024 2240   LYMPHSABS 1.1 06/22/2024 1346   MONOABS 0.9 06/22/2024 1346   EOSABS 0.1 06/22/2024 1346   BASOSABS 0.1 06/22/2024 1346    CMP     Component Value Date/Time   NA 137 08/02/2024 2240   K 4.3 08/02/2024 2240   CL 100 08/02/2024 2240   CO2 25 08/02/2024 2240   GLUCOSE 151 (H) 08/02/2024 2240   BUN 16 08/02/2024 2240   CREATININE 0.98 08/02/2024 2240   CREATININE 0.96 (H) 02/13/2016 1609   CALCIUM  9.3 08/02/2024 2240   PROT 7.0 08/02/2024 2240   ALBUMIN 4.1 08/02/2024 2240   AST 32 08/02/2024 2240   ALT 19 08/02/2024 2240   ALKPHOS 82 08/02/2024 2240   BILITOT 0.6 08/02/2024 2240   GFRNONAA 54 (L) 08/02/2024 2240   GFRAA 47 (L) 03/07/2018 1410     ASSESSMENT and THERAPY PLAN:   Assessment and Plan Assessment & Plan  Anemia with associated weakness and shortness of breath Persistent weakness and increased shortness of breath, especially on exertion. Previous iron  infusion in June showed some improvement. Hemoglobin normal in September. Shortness of breath may be due to anemia or deconditioning. - Check hemoglobin and iron  levels today. - Evaluate for further iron  supplementation based on lab results.  Deconditioning Shortness of breath with minimal exertion likely due to deconditioning. Sedentary lifestyle with limited physical activity may contribute to symptoms.  All questions were answered. The patient knows to call the clinic with any problems, questions or concerns. We can certainly see the patient much sooner if necessary.  Total encounter time:20 minutes*in face-to-face visit time, chart review, lab review, care coordination, order entry, and documentation of the encounter time.  *Total Encounter Time as defined by the Centers for Medicare and Medicaid Services includes, in addition to the face-to-face time of a patient visit (documented in the note above) non-face-to-face  time: obtaining and reviewing outside history, ordering and reviewing medications, tests or procedures, care coordination (communications with other health care professionals or caregivers) and documentation in the medical record.

## 2024-09-19 ENCOUNTER — Ambulatory Visit: Payer: Self-pay | Admitting: Hematology and Oncology

## 2024-09-20 ENCOUNTER — Encounter: Payer: Self-pay | Admitting: Hematology and Oncology

## 2024-09-20 NOTE — Telephone Encounter (Signed)
-----   Message from [place] sent at 09/19/2024  2:55 PM EDT ----- Hb normal, ferritin lower but still in the normal range. If she can continue taking oral iron  ferrous sulfate   65 mg daily, this might help. There is no immediate need for IV iron . ----- Message ----- From: Interface, Lab In [place] Sent: 09/18/2024  11:10 AM EDT To: Amber Stalls, MD

## 2024-09-20 NOTE — Telephone Encounter (Signed)
 S/w pt's daughter, [name] about results and gave MD recommendation. She verbalized thanks and understanding.

## 2024-09-26 ENCOUNTER — Ambulatory Visit: Admitting: Cardiology

## 2024-10-12 ENCOUNTER — Ambulatory Visit: Admitting: Cardiology

## 2024-10-14 NOTE — Progress Notes (Deleted)
  Cardiology Office Note:   Date:  10/14/2024  ID:  Sarah Armstrong, DOB [birth year], MRN [id number] PCP: [name], MD  [facility] Providers Cardiologist:  Lynwood Schilling, MD {  History of Present Illness:   Sarah Armstrong is a 89 y.o. female  who presents for evaluation of HTN and atrial fibrillation. She was found to have this in January of 2017.  She had a renal ultrasound that was normal and negative metanephrines.   She was in the hospital for treatment of GI bleed in 2019.      Since I last saw her she has been started on Synthroid.  Her TSH has been followed by her primary provider.  Her blood pressure has been pretty well-controlled.  Occasionally she will have to take an extra hydralazine  if she becomes emotionally upset that her blood pressure comes right back down.  For the most part she thinks it is well-controlled.    ROS: ***  Studies Reviewed:    EKG:       ***  Risk Assessment/Calculations:   {Does this patient have ATRIAL FIBRILLATION?:[phone]} No BP recorded.  {Refresh Note OR Click here to enter BP  :1}***        Physical Exam:   VS:  There were no vitals taken for this visit.   Wt Readings from Last 3 Encounters:  09/18/24 148 lb 6.4 oz (67.3 kg)  08/20/24 148 lb (67.1 kg)  08/02/24 150 lb (68 kg)     GEN: Well nourished, well developed in no acute distress NECK: No JVD; No carotid bruits CARDIAC: ***RR, *** murmurs, rubs, gallops RESPIRATORY:  Clear to auscultation without rales, wheezing or rhonchi  ABDOMEN: Soft, non-tender, non-distended EXTREMITIES:  No edema; No deformity   ASSESSMENT AND PLAN:   ATRIAL FIB:  ***  Sarah Armstrong has a CHA2DS2 - VASc score of 4.   She has not had any symptomatic paroxysms.  She is tolerating amiodarone  and is up-to-date with her liver tests and her thyroid  is being regulated.  She tolerates anticoagulation.  No change in therapy.    HTN:   The blood pressure is ***well-controlled on the meds as listed.  No  change in therapy.    MR/TR:   This has been mild *** and I will follow her clinically.   BRUIT:   She had mild plaque in May of 2019.  ***  No further imaging     Follow up ***  Signed, Lynwood Schilling, MD

## 2024-10-16 ENCOUNTER — Ambulatory Visit: Admitting: Cardiology

## 2024-10-16 DIAGNOSIS — I480 Paroxysmal atrial fibrillation: Secondary | ICD-10-CM

## 2024-10-16 DIAGNOSIS — N179 Acute kidney failure, unspecified: Secondary | ICD-10-CM

## 2024-10-16 DIAGNOSIS — I10 Essential (primary) hypertension: Secondary | ICD-10-CM

## 2024-10-24 ENCOUNTER — Other Ambulatory Visit: Payer: Self-pay | Admitting: Cardiology

## 2024-11-20 ENCOUNTER — Other Ambulatory Visit: Payer: Self-pay | Admitting: Cardiology

## 2024-11-26 ENCOUNTER — Other Ambulatory Visit

## 2024-12-04 ENCOUNTER — Ambulatory Visit: Admission: RE | Admit: 2024-12-04 | Discharge: 2024-12-04 | Disposition: A | Source: Ambulatory Visit

## 2024-12-04 DIAGNOSIS — R918 Other nonspecific abnormal finding of lung field: Secondary | ICD-10-CM

## 2024-12-06 NOTE — Progress Notes (Unsigned)
" °  Cardiology Office Note:   Date:  12/06/2024  ID:  Sarah Armstrong, DOB 06-25-1931, MRN [id number] PCP: [name], MD   [facility] Providers Cardiologist:  Lynwood Schilling, MD {  History of Present Illness:   Sarah Armstrong is a 89 y.o. female who presents for evaluation of HTN and atrial fibrillation. She was found to have this in January of 2017.  She had a renal ultrasound that was normal and negative metanephrines.   She was in the hospital for treatment of GI bleed in 2019.      Since I last saw her ***   ***  she has been started on Synthroid.  Her TSH has been followed by her primary provider.  Her blood pressure has been pretty well-controlled.  Occasionally she will have to take an extra hydralazine  if she becomes emotionally upset that her blood pressure comes right back down.  For the most part she thinks it is well-controlled.    ROS: ***  Studies Reviewed:    EKG:       ***  Risk Assessment/Calculations:   {Does this patient have ATRIAL FIBRILLATION?[phone]} No BP recorded.  {Refresh Note OR Click here to enter BP  :1}***        Physical Exam:   VS:  There were no vitals taken for this visit.   Wt Readings from Last 3 Encounters:  09/18/24 148 lb 6.4 oz (67.3 kg)  08/20/24 148 lb (67.1 kg)  08/02/24 150 lb (68 kg)     GEN: Well nourished, well developed in no acute distress NECK: No JVD; No carotid bruits CARDIAC: ***RR, *** murmurs, rubs, gallops RESPIRATORY:  Clear to auscultation without rales, wheezing or rhonchi  ABDOMEN: Soft, non-tender, non-distended EXTREMITIES:  No edema; No deformity   ASSESSMENT AND PLAN:   ATRIAL FIB:  Ms. Sarah Armstrong has a CHA2DS2 - VASc score of 4.   ***  She has not had any symptomatic paroxysms.  She is tolerating amiodarone  and is up-to-date with her liver tests and her thyroid  is being regulated.  She tolerates anticoagulation.  No change in therapy.    AKI:   The patient's last creatinine was ***  most recently  1.0.  No change in therapy.    HTN:   The blood pressure is ***  well-controlled on the meds as listed.  No change in therapy.    MR/TR:   ***  This has been mild and I will follow her clinically.   BRUIT:   ***  She had mild plaque in May of 2019.  No further imaging     Follow up ***  Signed, Lynwood Schilling, MD   "

## 2024-12-07 ENCOUNTER — Ambulatory Visit: Admitting: Cardiology

## 2024-12-13 ENCOUNTER — Ambulatory Visit: Payer: Self-pay

## 2024-12-13 DIAGNOSIS — R911 Solitary pulmonary nodule: Secondary | ICD-10-CM

## 2024-12-13 DIAGNOSIS — R918 Other nonspecific abnormal finding of lung field: Secondary | ICD-10-CM

## 2024-12-13 NOTE — Progress Notes (Signed)
 I called [name] and gave her an update. CT scan reassuring. Plan to order a repeat CT in 1 year

## 2024-12-21 ENCOUNTER — Telehealth: Payer: Self-pay | Admitting: Hematology and Oncology

## 2024-12-21 NOTE — Telephone Encounter (Signed)
 I left voicemail for patient's daughter regarding rescheduled 12/25/2024 lab and MD appointments per her request due to inclement weather.

## 2024-12-25 ENCOUNTER — Inpatient Hospital Stay: Admitting: Hematology and Oncology

## 2024-12-25 ENCOUNTER — Inpatient Hospital Stay

## 2025-01-18 ENCOUNTER — Inpatient Hospital Stay

## 2025-01-18 ENCOUNTER — Inpatient Hospital Stay: Admitting: Hematology and Oncology

## 2025-02-25 ENCOUNTER — Ambulatory Visit: Admitting: Cardiology
